# Patient Record
Sex: Male | Born: 1940 | Race: White | Hispanic: No | Marital: Married | Smoking: Former smoker
Health system: Southern US, Community
[De-identification: ages and names within clinical notes are randomized; demographics above are authoritative.]

## PROBLEM LIST (undated history)

## (undated) DIAGNOSIS — R0600 Dyspnea, unspecified: Secondary | ICD-10-CM

## (undated) DIAGNOSIS — R232 Flushing: Secondary | ICD-10-CM

## (undated) DIAGNOSIS — I2510 Atherosclerotic heart disease of native coronary artery without angina pectoris: Secondary | ICD-10-CM

## (undated) DIAGNOSIS — K222 Esophageal obstruction: Secondary | ICD-10-CM

## (undated) DIAGNOSIS — E785 Hyperlipidemia, unspecified: Secondary | ICD-10-CM

## (undated) DIAGNOSIS — S46219A Strain of muscle, fascia and tendon of other parts of biceps, unspecified arm, initial encounter: Secondary | ICD-10-CM

## (undated) DIAGNOSIS — I10 Essential (primary) hypertension: Secondary | ICD-10-CM

## (undated) DIAGNOSIS — I700 Atherosclerosis of aorta: Secondary | ICD-10-CM

## (undated) DIAGNOSIS — C61 Malignant neoplasm of prostate: Secondary | ICD-10-CM

## (undated) DIAGNOSIS — K219 Gastro-esophageal reflux disease without esophagitis: Secondary | ICD-10-CM

## (undated) DIAGNOSIS — IMO0002 Reserved for concepts with insufficient information to code with codable children: Secondary | ICD-10-CM

## (undated) DIAGNOSIS — R911 Solitary pulmonary nodule: Secondary | ICD-10-CM

## (undated) DIAGNOSIS — E119 Type 2 diabetes mellitus without complications: Secondary | ICD-10-CM

## (undated) DIAGNOSIS — I440 Atrioventricular block, first degree: Secondary | ICD-10-CM

## (undated) DIAGNOSIS — M4800 Spinal stenosis, site unspecified: Secondary | ICD-10-CM

## (undated) DIAGNOSIS — J690 Pneumonitis due to inhalation of food and vomit: Secondary | ICD-10-CM

## (undated) DIAGNOSIS — Z8709 Personal history of other diseases of the respiratory system: Secondary | ICD-10-CM

## (undated) DIAGNOSIS — M1990 Unspecified osteoarthritis, unspecified site: Secondary | ICD-10-CM

## (undated) DIAGNOSIS — C4490 Unspecified malignant neoplasm of skin, unspecified: Secondary | ICD-10-CM

## (undated) DIAGNOSIS — K5730 Diverticulosis of large intestine without perforation or abscess without bleeding: Secondary | ICD-10-CM

## (undated) HISTORY — PX: MASTECTOMY: SHX3

## (undated) HISTORY — DX: Malignant neoplasm of prostate: C61

## (undated) HISTORY — DX: Gastro-esophageal reflux disease without esophagitis: K21.9

## (undated) HISTORY — DX: Unspecified osteoarthritis, unspecified site: M19.90

## (undated) HISTORY — PX: HYDROCELE EXCISION / REPAIR: SUR1145

## (undated) HISTORY — DX: Solitary pulmonary nodule: R91.1

## (undated) HISTORY — PX: INGUINAL HERNIA REPAIR: SUR1180

## (undated) HISTORY — PX: OTHER SURGICAL HISTORY: SHX169

## (undated) HISTORY — DX: Hyperlipidemia, unspecified: E78.5

## (undated) HISTORY — DX: Esophageal obstruction: K22.2

## (undated) HISTORY — DX: Essential (primary) hypertension: I10

## (undated) HISTORY — PX: FOOT SURGERY: SHX648

## (undated) HISTORY — DX: Atherosclerotic heart disease of native coronary artery without angina pectoris: I25.10

## (undated) HISTORY — DX: Diverticulosis of large intestine without perforation or abscess without bleeding: K57.30

## (undated) HISTORY — DX: Reserved for concepts with insufficient information to code with codable children: IMO0002

## (undated) HISTORY — DX: Pneumonitis due to inhalation of food and vomit: J69.0

---

## 1998-05-11 ENCOUNTER — Emergency Department (HOSPITAL_COMMUNITY): Admission: EM | Admit: 1998-05-11 | Discharge: 1998-05-11 | Payer: Self-pay | Admitting: Emergency Medicine

## 1998-05-11 ENCOUNTER — Encounter: Payer: Self-pay | Admitting: Emergency Medicine

## 2002-07-08 ENCOUNTER — Ambulatory Visit (HOSPITAL_BASED_OUTPATIENT_CLINIC_OR_DEPARTMENT_OTHER): Admission: RE | Admit: 2002-07-08 | Discharge: 2002-07-08 | Payer: Self-pay | Admitting: Urology

## 2002-07-08 ENCOUNTER — Encounter: Payer: Self-pay | Admitting: Urology

## 2005-02-07 ENCOUNTER — Ambulatory Visit: Payer: Self-pay | Admitting: Pulmonary Disease

## 2005-07-18 HISTORY — PX: CARDIAC CATHETERIZATION: SHX172

## 2006-02-21 ENCOUNTER — Ambulatory Visit: Payer: Self-pay | Admitting: Pulmonary Disease

## 2006-02-23 ENCOUNTER — Ambulatory Visit: Payer: Self-pay | Admitting: Cardiology

## 2006-02-24 ENCOUNTER — Ambulatory Visit: Payer: Self-pay

## 2006-02-27 ENCOUNTER — Ambulatory Visit: Payer: Self-pay | Admitting: Cardiology

## 2006-02-27 ENCOUNTER — Ambulatory Visit: Payer: Self-pay

## 2006-02-28 ENCOUNTER — Inpatient Hospital Stay (HOSPITAL_BASED_OUTPATIENT_CLINIC_OR_DEPARTMENT_OTHER): Admission: RE | Admit: 2006-02-28 | Discharge: 2006-02-28 | Payer: Self-pay | Admitting: Cardiology

## 2006-02-28 ENCOUNTER — Ambulatory Visit: Payer: Self-pay | Admitting: Cardiology

## 2006-03-02 ENCOUNTER — Ambulatory Visit: Payer: Self-pay | Admitting: Cardiology

## 2006-03-02 ENCOUNTER — Inpatient Hospital Stay (HOSPITAL_COMMUNITY): Admission: AD | Admit: 2006-03-02 | Discharge: 2006-03-03 | Payer: Self-pay | Admitting: Cardiology

## 2006-03-14 ENCOUNTER — Ambulatory Visit: Payer: Self-pay | Admitting: Cardiovascular Disease

## 2006-03-16 ENCOUNTER — Ambulatory Visit: Payer: Self-pay | Admitting: Cardiology

## 2006-03-22 ENCOUNTER — Ambulatory Visit: Payer: Self-pay | Admitting: Pulmonary Disease

## 2006-03-24 ENCOUNTER — Ambulatory Visit: Payer: Self-pay | Admitting: Cardiology

## 2006-04-27 ENCOUNTER — Ambulatory Visit: Payer: Self-pay | Admitting: Cardiology

## 2006-05-25 ENCOUNTER — Encounter: Admission: RE | Admit: 2006-05-25 | Discharge: 2006-05-25 | Payer: Self-pay | Admitting: Urology

## 2006-06-23 ENCOUNTER — Ambulatory Visit: Payer: Self-pay | Admitting: Pulmonary Disease

## 2006-06-26 ENCOUNTER — Ambulatory Visit: Payer: Self-pay | Admitting: Cardiology

## 2006-06-26 ENCOUNTER — Ambulatory Visit: Payer: Self-pay | Admitting: Pulmonary Disease

## 2006-06-26 LAB — CONVERTED CEMR LAB
Albumin: 3.7 g/dL (ref 3.5–5.2)
Basophils Absolute: 0.1 10*3/uL (ref 0.0–0.1)
Basophils Relative: 0.8 % (ref 0.0–1.0)
CO2: 29 meq/L (ref 19–32)
Chloride: 106 meq/L (ref 96–112)
Eosinophil percent: 5.2 % — ABNORMAL HIGH (ref 0.0–5.0)
GFR calc non Af Amer: 80 mL/min
Glomerular Filtration Rate, Af Am: 97 mL/min/{1.73_m2}
Glucose, Bld: 121 mg/dL — ABNORMAL HIGH (ref 70–99)
HCT: 44.5 % (ref 39.0–52.0)
LDL Cholesterol: 77 mg/dL (ref 0–99)
MCHC: 33.5 g/dL (ref 30.0–36.0)
MCV: 93.8 fL (ref 78.0–100.0)
Monocytes Absolute: 0.7 10*3/uL (ref 0.2–0.7)
Neutro Abs: 2.5 10*3/uL (ref 1.4–7.7)
Potassium: 4.5 meq/L (ref 3.5–5.1)
RBC: 4.74 M/uL (ref 4.22–5.81)
RDW: 11.8 % (ref 11.5–14.6)
Sodium: 141 meq/L (ref 135–145)
Total Protein: 6.5 g/dL (ref 6.0–8.3)

## 2006-06-27 ENCOUNTER — Ambulatory Visit: Admission: RE | Admit: 2006-06-27 | Discharge: 2006-09-22 | Payer: Self-pay | Admitting: Radiation Oncology

## 2006-09-22 ENCOUNTER — Ambulatory Visit: Admission: RE | Admit: 2006-09-22 | Discharge: 2006-11-01 | Payer: Self-pay | Admitting: Radiation Oncology

## 2006-10-23 ENCOUNTER — Ambulatory Visit: Payer: Self-pay | Admitting: Pulmonary Disease

## 2006-11-02 ENCOUNTER — Ambulatory Visit: Payer: Self-pay | Admitting: Cardiology

## 2007-02-27 ENCOUNTER — Ambulatory Visit: Payer: Self-pay | Admitting: Pulmonary Disease

## 2007-03-09 ENCOUNTER — Ambulatory Visit: Payer: Self-pay | Admitting: Pulmonary Disease

## 2007-03-09 LAB — CONVERTED CEMR LAB
ALT: 27 units/L (ref 0–53)
Alkaline Phosphatase: 60 units/L (ref 39–117)
Basophils Absolute: 0 10*3/uL (ref 0.0–0.1)
Bilirubin, Direct: 0.1 mg/dL (ref 0.0–0.3)
Chloride: 108 meq/L (ref 96–112)
Cholesterol: 168 mg/dL (ref 0–200)
GFR calc Af Amer: 109 mL/min
HDL: 34 mg/dL — ABNORMAL LOW (ref >39.0)
Hemoglobin: 13.4 g/dL (ref 13.0–17.0)
LDL Cholesterol: 105 mg/dL — ABNORMAL HIGH (ref 0–99)
Lymphocytes Relative: 32 % (ref 12.0–46.0)
MCHC: 34.9 g/dL (ref 30.0–36.0)
MCV: 93.9 fL (ref 78.0–100.0)
PSA: 0.03 ng/mL — ABNORMAL LOW (ref 0.10–4.00)
Potassium: 4.9 meq/L (ref 3.5–5.1)
RBC: 4.08 M/uL — ABNORMAL LOW (ref 4.22–5.81)
Sodium: 142 meq/L (ref 135–145)
Total Protein: 6.3 g/dL (ref 6.0–8.3)
Triglycerides: 143 mg/dL (ref 0–149)
VLDL: 29 mg/dL (ref 0–40)

## 2007-06-05 ENCOUNTER — Telehealth (INDEPENDENT_AMBULATORY_CARE_PROVIDER_SITE_OTHER): Payer: Self-pay

## 2007-06-18 ENCOUNTER — Ambulatory Visit (HOSPITAL_COMMUNITY): Admission: RE | Admit: 2007-06-18 | Discharge: 2007-06-18 | Payer: Self-pay | Admitting: Urology

## 2007-06-19 ENCOUNTER — Encounter: Payer: Self-pay | Admitting: Pulmonary Disease

## 2007-06-19 DIAGNOSIS — I2510 Atherosclerotic heart disease of native coronary artery without angina pectoris: Secondary | ICD-10-CM | POA: Insufficient documentation

## 2007-06-19 DIAGNOSIS — C61 Malignant neoplasm of prostate: Secondary | ICD-10-CM | POA: Insufficient documentation

## 2007-06-19 DIAGNOSIS — E785 Hyperlipidemia, unspecified: Secondary | ICD-10-CM

## 2007-06-19 DIAGNOSIS — E7800 Pure hypercholesterolemia, unspecified: Secondary | ICD-10-CM | POA: Insufficient documentation

## 2007-06-27 ENCOUNTER — Ambulatory Visit: Payer: Self-pay | Admitting: Cardiology

## 2007-07-10 ENCOUNTER — Encounter: Payer: Self-pay | Admitting: Pulmonary Disease

## 2007-08-07 ENCOUNTER — Telehealth (INDEPENDENT_AMBULATORY_CARE_PROVIDER_SITE_OTHER): Payer: Self-pay | Admitting: *Deleted

## 2007-08-27 ENCOUNTER — Ambulatory Visit: Payer: Self-pay | Admitting: Pulmonary Disease

## 2007-08-27 DIAGNOSIS — C7800 Secondary malignant neoplasm of unspecified lung: Secondary | ICD-10-CM | POA: Insufficient documentation

## 2007-08-27 DIAGNOSIS — K5730 Diverticulosis of large intestine without perforation or abscess without bleeding: Secondary | ICD-10-CM | POA: Insufficient documentation

## 2007-08-27 DIAGNOSIS — K219 Gastro-esophageal reflux disease without esophagitis: Secondary | ICD-10-CM | POA: Insufficient documentation

## 2007-08-27 DIAGNOSIS — I10 Essential (primary) hypertension: Secondary | ICD-10-CM | POA: Insufficient documentation

## 2007-08-27 DIAGNOSIS — M1990 Unspecified osteoarthritis, unspecified site: Secondary | ICD-10-CM | POA: Insufficient documentation

## 2007-08-27 DIAGNOSIS — J42 Unspecified chronic bronchitis: Secondary | ICD-10-CM | POA: Insufficient documentation

## 2007-09-03 ENCOUNTER — Ambulatory Visit: Payer: Self-pay | Admitting: Pulmonary Disease

## 2007-09-08 LAB — CONVERTED CEMR LAB
ALT: 27 units/L (ref 0–53)
AST: 20 units/L (ref 0–37)
Albumin: 3.7 g/dL (ref 3.5–5.2)
Alkaline Phosphatase: 60 units/L (ref 39–117)
CO2: 28 meq/L (ref 19–32)
Calcium: 9.6 mg/dL (ref 8.4–10.5)
Cholesterol: 163 mg/dL (ref 0–200)
Creatinine, Ser: 1.1 mg/dL (ref 0.4–1.5)
Direct LDL: 90.8 mg/dL
GFR calc non Af Amer: 71 mL/min
Glucose, Bld: 129 mg/dL — ABNORMAL HIGH (ref 70–99)
HDL: 34.1 mg/dL — ABNORMAL LOW (ref >39.0)
Total Protein: 6.7 g/dL (ref 6.0–8.3)
VLDL: 41 mg/dL — ABNORMAL HIGH (ref 0–40)

## 2007-10-31 ENCOUNTER — Ambulatory Visit: Payer: Self-pay | Admitting: Cardiology

## 2008-05-27 ENCOUNTER — Ambulatory Visit: Payer: Self-pay | Admitting: Pulmonary Disease

## 2008-10-30 ENCOUNTER — Encounter: Payer: Self-pay | Admitting: Pulmonary Disease

## 2008-11-24 ENCOUNTER — Ambulatory Visit: Payer: Self-pay | Admitting: Cardiology

## 2008-11-24 DIAGNOSIS — E663 Overweight: Secondary | ICD-10-CM | POA: Insufficient documentation

## 2008-11-26 ENCOUNTER — Telehealth: Payer: Self-pay | Admitting: Cardiology

## 2008-12-07 ENCOUNTER — Encounter: Payer: Self-pay | Admitting: Cardiology

## 2008-12-08 ENCOUNTER — Ambulatory Visit: Payer: Self-pay | Admitting: Cardiology

## 2008-12-16 LAB — CONVERTED CEMR LAB
ALT: 28 units/L (ref 0–53)
Alkaline Phosphatase: 46 units/L (ref 39–117)
BUN: 16 mg/dL (ref 6–23)
Bilirubin, Direct: 0.1 mg/dL (ref 0.0–0.3)
CO2: 27 meq/L (ref 19–32)
Chloride: 109 meq/L (ref 96–112)
Creatinine, Ser: 1 mg/dL (ref 0.4–1.5)
GFR calc non Af Amer: 79.15 mL/min (ref >60)
Glucose, Bld: 107 mg/dL — ABNORMAL HIGH (ref 70–99)
LDL Cholesterol: 89 mg/dL (ref 0–99)
Potassium: 4 meq/L (ref 3.5–5.1)
Triglycerides: 131 mg/dL (ref 0.0–149.0)
VLDL: 26.2 mg/dL (ref 0.0–40.0)

## 2008-12-25 ENCOUNTER — Telehealth (INDEPENDENT_AMBULATORY_CARE_PROVIDER_SITE_OTHER): Payer: Self-pay | Admitting: *Deleted

## 2009-05-25 ENCOUNTER — Telehealth: Payer: Self-pay | Admitting: Pulmonary Disease

## 2009-06-03 ENCOUNTER — Ambulatory Visit: Payer: Self-pay | Admitting: Pulmonary Disease

## 2009-06-05 LAB — CONVERTED CEMR LAB
Cholesterol: 161 mg/dL (ref 0–200)
HDL: 35.7 mg/dL — ABNORMAL LOW (ref >39.00)
Total CHOL/HDL Ratio: 5
Triglycerides: 148 mg/dL (ref 0.0–149.0)

## 2009-07-18 HISTORY — PX: ESOPHAGOGASTRODUODENOSCOPY: SHX1529

## 2009-08-19 ENCOUNTER — Ambulatory Visit: Payer: Self-pay | Admitting: Pulmonary Disease

## 2009-11-17 ENCOUNTER — Telehealth: Payer: Self-pay | Admitting: Pulmonary Disease

## 2009-11-17 ENCOUNTER — Ambulatory Visit: Payer: Self-pay | Admitting: Internal Medicine

## 2009-11-17 ENCOUNTER — Encounter (INDEPENDENT_AMBULATORY_CARE_PROVIDER_SITE_OTHER): Payer: Self-pay | Admitting: *Deleted

## 2009-11-17 ENCOUNTER — Encounter: Payer: Self-pay | Admitting: Gastroenterology

## 2009-11-17 ENCOUNTER — Telehealth: Payer: Self-pay | Admitting: Gastroenterology

## 2009-11-17 DIAGNOSIS — R1319 Other dysphagia: Secondary | ICD-10-CM | POA: Insufficient documentation

## 2009-11-17 DIAGNOSIS — R1310 Dysphagia, unspecified: Secondary | ICD-10-CM | POA: Insufficient documentation

## 2009-11-18 ENCOUNTER — Telehealth: Payer: Self-pay | Admitting: Physician Assistant

## 2009-11-23 ENCOUNTER — Ambulatory Visit: Payer: Self-pay | Admitting: Gastroenterology

## 2009-12-02 ENCOUNTER — Telehealth: Payer: Self-pay | Admitting: Gastroenterology

## 2010-03-18 ENCOUNTER — Telehealth (INDEPENDENT_AMBULATORY_CARE_PROVIDER_SITE_OTHER): Payer: Self-pay | Admitting: *Deleted

## 2010-03-19 ENCOUNTER — Ambulatory Visit: Payer: Self-pay | Admitting: Pulmonary Disease

## 2010-03-21 LAB — CONVERTED CEMR LAB
ALT: 30 units/L (ref 0–53)
AST: 29 units/L (ref 0–37)
Alkaline Phosphatase: 57 units/L (ref 39–117)
BUN: 19 mg/dL (ref 6–23)
Bilirubin, Direct: 0.1 mg/dL (ref 0.0–0.3)
Calcium: 9 mg/dL (ref 8.4–10.5)
Chloride: 104 meq/L (ref 96–112)
Eosinophils Absolute: 0.3 10*3/uL (ref 0.0–0.7)
GFR calc non Af Amer: 76.21 mL/min (ref >60)
Glucose, Bld: 110 mg/dL — ABNORMAL HIGH (ref 70–99)
HDL: 42 mg/dL (ref >39.00)
LDL Cholesterol: 93 mg/dL (ref 0–99)
Monocytes Absolute: 0.6 10*3/uL (ref 0.1–1.0)
Monocytes Relative: 9.9 % (ref 3.0–12.0)
Neutro Abs: 2.5 10*3/uL (ref 1.4–7.7)
PSA: 0.57 ng/mL (ref 0.10–4.00)
Platelets: 170 10*3/uL (ref 150.0–400.0)
Potassium: 4.5 meq/L (ref 3.5–5.1)
Sodium: 138 meq/L (ref 135–145)
Total CHOL/HDL Ratio: 4
Total Protein: 6.6 g/dL (ref 6.0–8.3)
VLDL: 28 mg/dL (ref 0.0–40.0)
WBC: 6.2 10*3/uL (ref 4.5–10.5)

## 2010-03-25 ENCOUNTER — Ambulatory Visit: Payer: Self-pay | Admitting: Cardiology

## 2010-04-22 ENCOUNTER — Ambulatory Visit: Payer: Self-pay

## 2010-04-22 ENCOUNTER — Ambulatory Visit: Payer: Self-pay | Admitting: Cardiology

## 2010-07-18 HISTORY — PX: SKIN CANCER EXCISION: SHX779

## 2010-08-17 NOTE — Assessment & Plan Note (Signed)
Summary: f1y/dx:414.00/lg   Visit Type:  Follow-up Primary Provider:  Alroy Dust, MD  CC:  CAD.  History of Present Illness: The patient presents for followup of his known coronary disease.  Since I last saw him he has had no new cardiovascular problems. He says he's been working a lot and having stress from this. He thinks this has left him with some fatigue and he was recently started on some Xanax. He is not exercising routinely though he does walk at work. With this he denies chest pressure, neck or arm discomfort. He has had no palpitations, presyncope or syncope. He has had no shortness of breath and denies any PND or orthopnea. He denies any weight gain or edema.  He reports his blood pressures have been in the 110 to 120s systolic with diastolics in the 70s. I reviewed lipids done the other day and his LDL is 93 with an HDL of 42.  Current Medications (verified): 1)  Adprin B 325 Mg  Tabs (Aspirin Buf(Cacarb-Mgcarb-Mgo)) .... Once Daily 2)  Atenolol 25 Mg  Tabs (Atenolol) .Marland Kitchen.. 1 Tab By Mouth Once Daily 3)  Lisinopril 10 Mg Tabs (Lisinopril) .... I By Mouth Daily 4)  Pravastatin Sodium 20 Mg  Tabs (Pravastatin Sodium) .Marland Kitchen.. 1 Tab By Mouth At Bedtime 5)  Aciphex 20 Mg Tbec (Rabeprazole Sodium) .... Take 1 Tab Daily 30 Min Before Breakfast 6)  Alprazolam 0.5 Mg Tabs (Alprazolam) .... Take 1/2 To 1 Tab By Mouth Three Times A Day As Needed For Nerves...  Allergies (verified): 1)  ! Simvastatin (Simvastatin)  Past History:  Past Medical History: Reviewed history from 03/19/2010 and no changes required. BRONCHITIS, RECURRENT (ICD-491.9) Hx of PULMONARY NODULE (ICD-518.89) CAD (anterior ischemia on a stress perfusion study. (Catheterization in 2007, demonstrating 95% mid- LAD stenosis.  The circumflex had 20% proximal stenosis, the right coronary artery had 20% mid stenosis.  The EF was 60%.  He had a non drug-eluting stent placed) (ICD-414.00) HYPERTENSION  (ICD-401.9) DYSLIPIDEMIA (ICD-272.4) GERD (ICD-530.81 ESOPHAGEAL STRICTURE  DIVERTICULOSIS OF COLON (ICD-562.10) PROSTATE CANCER (ICD-185) DEGENERATIVE JOINT DISEASE (ICD-715.90)  Past Surgical History: Eyelid Surgery for Lid Lag Foot Surgery Hydrocele Repair Lung Nodules, probably benign Mastectomy for benign tumor Inguinal hernia repair Post Stenting to the Mid LAD Prostate treatment  Review of Systems       As stated in the HPI and negative for all other systems.   Vital Signs:  Patient profile:   70 year old male Height:      71 inches Weight:      199 pounds BMI:     27.86 Pulse rate:   58 / minute BP sitting:   111 / 71  (left arm) Cuff size:   regular  Vitals Entered By: Burnett Kanaris, CNA (March 25, 2010 9:46 AM)  Physical Exam  General:  Well developed, well nourished, in no acute distress. Head:  normocephalic and atraumatic Eyes:  PERRLA/EOM intact; conjunctiva and lids normal. Mouth:  Edentulous. Oral mucosa normal. Neck:  Neck supple, no JVD. No masses, thyromegaly or abnormal cervical nodes. Chest Wall:  no deformities or breast masses noted Lungs:  Clear bilaterally to auscultation and percussion. Heart:  Non-displaced PMI, chest non-tender; regular rate and rhythm, S1, S2 without murmurs, rubs or gallops. Carotid upstroke normal, no bruit. Normal abdominal aortic size, no bruits. Femorals normal pulses, no bruits. Pedals normal pulses. No edema, no varicosities. Abdomen:  Bowel sounds positive; abdomen soft and non-tender without masses, organomegaly, or hernias noted. No  hepatosplenomegaly. Msk:  Back normal, normal gait. Muscle strength and tone normal. Extremities:  No clubbing or cyanosis. Neurologic:  Alert and oriented x 3. Skin:  Intact without lesions or rashes. Cervical Nodes:  no significant adenopathy Axillary Nodes:  no significant adenopathy Inguinal Nodes:  no significant adenopathy Psych:  Normal affect.   Impression &  Recommendations:  Problem # 1:  CAD (ICD-414.00) It has been over 4 years since his catheterization and stent. Screening with an exercise treadmill test as indicated. This will allow me to rule out obstructive coronary disease as well as risk stratify and most importantly give him a prescription for exercise. Orders: Treadmill (Treadmill) EKG w/ Interpretation (93000)  Problem # 2:  HYPERTENSION (ICD-401.9) His blood pressure is in an excellent range and I would encourage him to continue the meds as listed.  Problem # 3:  DYSLIPIDEMIA (ICD-272.4) His lipids are at target though not completely ideal. He will continue with the same meds and review his diet.  Patient Instructions: 1)  Your physician recommends that you schedule a follow-up appointment at the time of your treadmill and then 1 yr 2)  Your physician recommends that you continue on your current medications as directed. Please refer to the Current Medication list given to you today. 3)  Your physician has requested that you have an exercise tolerance test.  For further information please visit [email or web address]  Please also follow instruction sheet, as given.

## 2010-08-17 NOTE — Progress Notes (Signed)
Summary: appt w/GI  Phone Note Call from Patient Call back at Home Phone [phone]   Caller: spouse-Bonnie Call For: Kriste Basque Reason for Call: Talk to Nurse Summary of Call: Called to try to get pt in for appt w/ Dr. Jarold Motto.  First avaiable was 12/15/2009.  Pt having trouble swallowing.  Wife doesn't feel he can wait that long.  Wants to know if SN will call and try to get him in earlier. Initial call taken by: Eugene Gavia,  Nov 17, 2009 9:19 AM  Follow-up for Phone Call        called and spoke with pt's spouse, Kendal Hymen.  Kendal Hymen states pt has seen Dr. Jarold Motto in the past, it has just been several years, and therefore doesn't want to see another physician in GI.  Pt c/o increased difficulty swollowing x several weeks.  Kendal Hymen states pt cannot drink liquids without "throat closing up" causing patient to choke and cough.  BOnnie would like pt to be seen by GI asap.  Will forward message to SN to address.  Aundra Millet Reynolds LPN  Nov 18, 979 9:47 AM   Called GI and spoke with Lupita Leash who schedules pt to see Amy, PA today 11/17/09 at 3:30. Pt's wife and pt is aware of appt. Rhonda Cobb  Nov 17, 2009 10:24 AM

## 2010-08-17 NOTE — Progress Notes (Signed)
Summary: Aciphex Rx  Phone Note Call from Patient   Caller: Patient Summary of Call: Pt calling.  States he was given samples of Aciphex and was told to call if needed rx.  Pt states this is working well for him.  Req Rx  Initial call taken by: Ashok Cordia RN,  Dec 02, 2009 2:50 PM  Follow-up for Phone Call        Rx sent as requested. Follow-up by: Ashok Cordia RN,  Dec 02, 2009 2:50 PM    Prescriptions: ACIPHEX 20 MG TBEC (RABEPRAZOLE SODIUM) Take 1 tab daily 30 min before breakfast  #30 x 6   Entered by:   Ashok Cordia RN   Authorized by:   Mardella Layman MD [facility]   Signed by:   Ashok Cordia RN on 12/02/2009   Method used:   Electronically to        [facility]  [address] (retail)       [address]       [place], [place]  [address]       Ph: [phone]       Fax: [phone]   RxID:   [id number]

## 2010-08-17 NOTE — Procedures (Signed)
Summary: Upper Endoscopy  Patient: [name] Note: All result statuses are Final unless otherwise noted.  Tests: (1) Upper Endoscopy (EGD)   EGD Upper Endoscopy       DONE     [facility]     [address]     [place], [place]  [address]           ENDOSCOPY PROCEDURE REPORT           PATIENT:  [name], [name]  MR#:  [id number]     BIRTHDATE:  Nov 19, 1940, 69 yrs. old  GENDER:  male           ENDOSCOPIST:  Vania Rea. Jarold Motto, MD, [facility]     Referred by:  Mike Gip, PA-C           PROCEDURE DATE:  11/23/2009     PROCEDURE:  EGD, diagnostic, Elease Hashimoto Dilation of Esophagus     ASA CLASS:  Class II     INDICATIONS:  dysphagia, GERD           MEDICATIONS:   Fentanyl 75 mcg IV, Versed 8 mg IV     TOPICAL ANESTHETIC:  Exactacain Spray           DESCRIPTION OF PROCEDURE:   After the risks benefits and     alternatives of the procedure were thoroughly explained, informed     consent was obtained.  The [id number]-H180 [id number] endoscope was     introduced through the mouth and advanced to the second portion of     the duodenum, without limitations.  The instrument was slowly     withdrawn as the mucosa was fully examined.     <<PROCEDUREIMAGES>>           A stricture was found. SMOOTH STRICTURING OF DISTAL     GEJ.DILATED#57F MALONEY DILATOR.NO HEME OR PAIN,,  A hiatal hernia     was found. 3 CM HH NOTED.NO BARRETT'S MUCOSA.  The stomach was     entered and closely examined. The antrum, angularis, and lesser     curvature were well visualized, including a retroflexed view of     the cardia and fundus. The stomach wall was normally distensable.     The scope passed easily through the pylorus into the duodenum.     The duodenal bulb was normal in appearance, as was the postbulbar     duodenum. LARGE DUODENAL DIVERTICULUM FULL OF DEBRIS.SEE PICTURE.     Retroflexed views revealed a hiatal hernia.    The scope was then     withdrawn from the patient and the procedure completed.       COMPLICATIONS:  None           ENDOSCOPIC IMPRESSION:     1) Stricture     2) Hiatal hernia     3) Normal stomach     4) Normal duodenum     5) A hiatal hernia     CHNIC GERD AND PEPTIC STRICTURE DILATED.     RECOMMENDATIONS:     1) Anti-reflux regimen to be follow     2) Clear liquids until, then soft foods rest iof day. Resume     prior diet tomorrow.     ACIPHEX 20 MG QAM.SAMPLES GIVEN.           REPEAT EXAM:  No           ______________________________     Vania Rea. Jarold Motto, MD, Clementeen Graham  CC:  Michele Mcalpine, MD           n.     Rosalie DoctorVania Rea. Afnan Emberton at 11/23/2009 04:25 PM           Jerral Ralph, [id number]  Note: An exclamation mark (!) indicates a result that was not dispersed into the flowsheet. Document Creation Date: 11/23/2009 4:26 PM _______________________________________________________________________  (1) Order result status: Final Collection or observation date-time: 11/23/2009 16:17 Requested date-time:  Receipt date-time:  Reported date-time:  Referring Physician:   Ordering Physician: Sheryn Bison [id number]) Specimen Source:  Source: Launa Grill Order Number: [id number] Lab site:

## 2010-08-17 NOTE — Letter (Signed)
Summary: New Patient letter  [facility]  [address] [place], [place] [address]   Phone: [phone]  Fax: [phone]       11/17/2009 MRN: [id number]  [facility] [address] [place], [place]  [address]  Dear Mr. Cunanan,  Welcome to the Gastroenterology Division at [facility].    You are scheduled to see Dr. Jarold Motto on 12-15-09 at 10:00a.m. on the 3rd floor at [facility], [address]  We ask that you try to arrive at our office 15 minutes prior to your appointment time to allow for check-in.  We would like you to complete the enclosed self-administered evaluation form prior to your visit and bring it with you on the day of your appointment.  We will review it with you.  Also, please bring a complete list of all your medications or, if you prefer, bring the medication bottles and we will list them.  Please bring your insurance card so that we may make a copy of it.  If your insurance requires a referral to see a specialist, please bring your referral form from your primary care physician.  Co-payments are due at the time of your visit and may be paid by cash, check or credit card.     Your office visit will consist of a consult with your physician (includes a physical exam), any laboratory testing he/she may order, scheduling of any necessary diagnostic testing (e.g. x-ray, ultrasound, CT-scan), and scheduling of a procedure (e.g. Endoscopy, Colonoscopy) if required.  Please allow enough time on your schedule to allow for any/all of these possibilities.    If you cannot keep your appointment, please call [phone] to cancel or reschedule prior to your appointment date.  This allows [place] the opportunity to schedule an appointment for another patient in need of care.  If you do not cancel or reschedule by 5 p.m. the business day prior to your appointment date, you will be charged a $50.00 late cancellation/no-show fee.    Thank you for  choosing [facility] for your medical needs.  We appreciate the opportunity to care for you.  Please visit [place] at our website  to learn more about our practice.                     Sincerely,                                                             The Gastroenterology Division

## 2010-08-17 NOTE — Progress Notes (Signed)
Summary: labs  Phone Note Call from Patient Call back at [phone]   Caller: Patient Call For: nadel Summary of Call: pt would like to have labs done to determine why he is tired and dizzy. Initial call taken by: Rickard Patience,  March 18, 2010 8:16 AM  Follow-up for Phone Call        Spoke with pt wife and pt has been c/o extreme fatigue and dizziness x 2 weeks. There have been no changing meds per patient. Patient wants to know if there are any blood test that can be ordered to check to see why he is so dizzy and fatigued. Carron Curie CMA  March 18, 2010 10:10 AM   Additional Follow-up for Phone Call Additional follow up Details #1::        per SN---for the fatigue we can check labs but for the dizziness there are no labs he will need ov.  do they want to do the labs and ov at the same time?  if so SN has an opening for tomorrow at 10.  thanks Randell Loop CMA  March 18, 2010 10:41 AM    Spoke with pt's spouse; would like to have pts labs done before appt on Friday at 10am. Please advise that labs and I will call patient spouse back.Reynaldo Minium CMA  March 18, 2010 11:46 AM     Additional Follow-up for Phone Call Additional follow up Details #2::    can do---cbc-285.9, bmp-401.9 and tsh-244.9/  thanks Randell Loop CMA  March 18, 2010 11:51 AM    Pt spouse aware that Labs are in IDX and hav ethem done prior to 10am appt.Reynaldo Minium CMA  March 18, 2010 12:14 PM

## 2010-08-17 NOTE — Assessment & Plan Note (Signed)
Summary: Dysphagia/dfs   History of Present Illness Visit Type: Initial Consult Primary GI MD: Sheryn Bison MD FACP FAGA Primary Provider: Alroy Dust, MD Requesting Provider: Alroy Dust, MD Chief Complaint: Patient complains of dysphagia that started about a week ago. He is taking Prilosec but he feels like his esophagus is closing up on him as he eats. He has a history of dilations and relates the way he feels now to the way he felt before the previous dilations.  History of Present Illness:   70 YO  MALE  KNOWN TO DR. PATTERSON WITH PRIOR EGD AND COLONOSCOPY IN 2004. HE DID HAVE A STRICTURE IN THE DISTAL ESOPHAGUS WHICH WAS MALONEY DILATED TO 18 MM. HE HAS BEEN ON CHRONIC OTC PRILOSEC. HE SAYS ACIPHEX WORKS AOT BETTER FOR HIM ,BUT INSURANCE WOULD NOT COVER IN THE PAST.HE HAS HAD PROBLEMS WITH DIARRHEA WITH  OTHER PPI'S.  HE COMES IN WITH C/O DYSOHAGIA OVER THE PAST COUPLE WEEKS.APPETITE IS OK,WEIGHT IS STABLE. HE IS HAVING DISCOMFORT WITH SWALLOWING-FEELING FOOD HANG BUT EVENTUALLY GOES ON DOWN. NO REGURGITATION.HE HAS ALSO NOTICED SOME DISCOMFORT WITH LIQUIDS THIS PAST WEEK.   GI Review of Systems    Reports acid reflux and  dysphagia with solids.      Denies abdominal pain, belching, bloating, chest pain, dysphagia with liquids, heartburn, loss of appetite, nausea, vomiting, vomiting blood, and  weight loss.        Denies anal fissure, black tarry stools, change in bowel habit, constipation, diarrhea, diverticulosis, fecal incontinence, heme positive stool, hemorrhoids, irritable bowel syndrome, jaundice, light color stool, liver problems, rectal bleeding, and  rectal pain. Preventive Screening-Counseling & Management      Drug Use:  no.      Current Medications (verified): 1)  Adprin B 325 Mg  Tabs (Aspirin Buf(Cacarb-Mgcarb-Mgo)) .... Once Daily 2)  Atenolol 25 Mg  Tabs (Atenolol) .Marland Kitchen.. 1 Tab By Mouth Once Daily 3)  Lisinopril 10 Mg Tabs (Lisinopril) .... I By Mouth Daily 4)   Pravastatin Sodium 20 Mg  Tabs (Pravastatin Sodium) .Marland Kitchen.. 1 Tab By Mouth At Bedtime 5)  Prilosec 20 Mg Cpdr (Omeprazole) .... Take 1 Tablet Daily  Allergies (verified): 1)  ! Simvastatin (Simvastatin)  Past History:  Past Medical History:  BRONCHITIS, RECURRENT (ICD-491.9) Hx of PULMONARY NODULE (ICD-518.89) CAD (anterior ischemia on a stress perfusion study. (Catheterization in 2007, demonstrating 95% mid- LAD stenosis.  The circumflex had 20% proximal stenosis, the right coronary artery had 20% mid stenosis.  The EF was 60%.  He had a non drug-eluting stent placed) (ICD-414.00) HYPERTENSION (ICD-401.9) DYSLIPIDEMIA (ICD-272.4) GERD (ICD-530.81 ESOPHAGEAL STRICTURE  DIVERTICULOSIS OF COLON (ICD-562.10) PROSTATE CANCER (ICD-185) DEGENERATIVE JOINT DISEASE (ICD-715.90)  Past Surgical History:  Eyelid Surgery for Lid Lag Foot Surgery Hydrocele Repair Lung Nodules, probably benign Mastectomy for benign tumor Inguinal hernia repair Post Stenting to the Mid LAD prostate treatment  Family History: Noncontributory for early coronary disease. Family History of Prostate Cancer: all brothers, father No FH of Colon Cancer:  Social History:  The patient smoked just very briefly most recently in the  last six months.  He quit a week ago.  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 Alcohol Use - yes 3 beers per day Daily Caffeine Use 2 per day Illicit Drug Use - no Drug Use:  no  Review of Systems       The patient complains of allergy/sinus and sleeping problems.  The patient denies anemia, anxiety-new,  arthritis/joint pain, back pain, blood in urine, breast changes/lumps, change in vision, confusion, cough, coughing up blood, depression-new, fainting, fatigue, fever, headaches-new, hearing problems, heart murmur, heart rhythm changes, itching, menstrual pain, muscle pains/cramps, night sweats, nosebleeds, pregnancy symptoms, shortness of breath, skin  rash, sore throat, swelling of feet/legs, swollen lymph glands, thirst - excessive , urination - excessive , urination changes/pain, urine leakage, vision changes, and voice change.         ROS OTHERWISE AS IN HPI  Vital Signs:  Patient profile:   69 year old male Height:      71 inches Weight:      203.8 pounds BMI:     28.53 Pulse rate:   60 / minute Pulse rhythm:   regular BP sitting:   138 / 82  (right arm) Cuff size:   regular  Vitals Entered By: Harlow Mares CMA Duncan Dull) (Nov 17, 2009 3:56 PM)  Physical Exam  General:  Well developed, well nourished, no acute distress. Head:  Normocephalic and atraumatic. Eyes:  PERRLA, no icterus. Lungs:  Clear throughout to auscultation. Heart:  Regular rate and rhythm; no murmurs, rubs,  or bruits. Abdomen:  SOFT, NONTENDER, NO MASS OR HSM,BS+ Rectal:  NOT DONE Extremities:  No clubbing, cyanosis, edema or deformities noted. Neurologic:  Alert and  oriented x4;  grossly normal neurologically. Psych:  Alert and cooperative. Normal mood and affect.   Impression & Recommendations:  Problem # 1:  DYSPHAGIA (KGU-542.70) Assessment Deteriorated 69 YO MALE WITH SOLID FOOD DYSPHAGIA AND MILD ODYNOPHAGIA. HX OF ESOPHAGEAL STRICTURE WITH DILATION 2004. SXS MOST CONSISTENT WITH RECURRENT STRICTURE,MAY HAVE COMPONENT OF ESOPHAGITIS AS WELL.  SCHEDULE FOR EGD WITH DILATION WITH DR. PATTERSON-PROCEDURE DISCUSSED IN DETAIL WITH PT.  TRIAL OF ACIPHEX 20 MG TWICE DAILY X ONE WEEK ,THEN DAILY IN AM (INSATED OF PRILOSEC ).  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 2)  Stop the Prilosec. 3)  We have  scheduled the Endoscopy  for 11-23-09.  4)  Directions and brochure given. 5)  Hold the Aspirin 3 days before the procedure.  6)  Copy sent to : Alroy Dust, MD 7)  The medication list was reviewed and reconciled.  All changed / newly prescribed medications were explained.  A complete medication list was provided to the patient / caregiver. Prescriptions: ACIPHEX 20 MG TBEC (RABEPRAZOLE SODIUM) Take 1 tab daily 30 min before breakfast  #30 x 3   Entered by:   Lowry Ram NCMA   Authorized by:   Sammuel Cooper PA-c   Signed by:   Lowry Ram NCMA on 11/17/2009   Method used:   Electronically to        [facility]  [address] (retail)       [address]       [place], [place]  [address]       Ph: [phone]       Fax: [phone]   RxID:   [id number]

## 2010-08-17 NOTE — Assessment & Plan Note (Signed)
Summary: fatigue and dizzy/ok per LA/kcw   Primary Care Provider:  Alroy Dust, MD  CC:  7 month ROV & add-on for fatigue & dizzy....  History of Present Illness: 70 y/o WM here for a follow up visit... he has multiple medical problems as noted below...     ~  August 19, 2009:  routine f/u doing well- business was slow & he did a job in [place] but it didn't work out, now picking back up... he is active- denies CP, palpit, SOB, swelling, etc... he fell on the ice w/ some incr in right shoulder discomfort- he will call DrDaldorf as needed... he had labs 5/10 from DrHochrein & doesn't want full labs today, due for Flu shot...   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 he denies CP, palpit, change in SOB (he has chr stable DOE), cough, phlegm, edema, etc... BP controlled on meds;  no angina;  he saw GI 5/11 w/ dysphagia- EGD 5/11 by DrPatterson showed recurrent stricture, dilated #34F maloney, 3cmHH, & changed to ACIPHEX 20mg /d... we discussed checking FASTING labs today, and adding Alprazolam Prn fr all the stress he is under.    Current Problem List:  BRONCHITIS, RECURRENT (ICD-491.9) - no recent URI symptoms... Hx of PULMONARY NODULE (ICD-518.89) - mult sm benign pulm nodules (?granulomas) on CTChest- last 12/07 without adenopathy, no change, +coronary calcif seen...   ~  f/u CXR 8/08 without nodules seen...  ~  f/u CXR 11/09 clear, no lesions seen...  ~  f/u CXR 2/11 showed clear, NAD...  HYPERTENSION (ICD-401.9) - contolled on ATENOLOL 25mg /d, & LISINOPRIL 10mg /d... BP=110/62 here and usually 120s/ 70s at home- denies HA, fatigue, visual changes, CP, palipit, dizziness, syncope, dyspnea, edema, etc...  CAD (ICD-414.00) - followed by DrHochrein... on ASA 325mg /d.  ~  NuclearStressTest 8/07 abnormal w/ ant ischemia...  ~  cath 8/07 showed 95% mid-LAD  stenosis, & 20% lesions in the other 2 vessels, EF=60%... subseq PTCA w/ non-drug eluting stent...  ~  last OV w/ Cards= 5/10 & note reviewed- BP sl elevated & Lisinopril added.  DYSLIPIDEMIA (ICD-272.4) - on PRAVACHOL 20mg /d and tol OK...   ~  FLP 8/08 showed TChol 168, TG 143, HDL34, LDL105  ~  FLP 2/09 showed TChol 163, TG 207, HDL 34, LDL 91  ~  FLP 5/10 showed TChol 148, TG 131, HDL 33, LDL 89  ~  FLP 9/11 showed TChol 163, TG 140, HDL 42, LDL 93  GERD (ICD-530.81) - prev on OMEP20 but causes diarrhea he says, and ACIPHEX 20mg /d works better...   ~  last EGD (DrPatterson)was 8/04 showing GERD & stricture dilated...  ~  5/11: presented to GI w/ recurrent dysphagia- EGD showed 3cmHH, stricture dilated, changed to Aciphex.  DIVERTICULOSIS OF COLON (ICD-562.10) - last colonoscopy 8/04 by DrPatterson was normal, f/u 15yrs (scattered tics seen on 1996 flex)  PROSTATE CANCER (ICD-185) - followed by DrPeterson- elevated PSA found 8/07 (10.6)... biopsies were pos & there is a family hx as well... after consultation at [facility] they rec XRT, then hormonal Rx for 2 yrs... XRT completed by [facility] 4/08... prev on TRELSTAR injections q 53months & PROVERA for the hot flashes>> off both now... neg bone scan 1/08... last PSA here= 0.03 in 2008...  ~  2/11: we don't have recent note from [facility] &  pt will request info sent to [place] at next visit.  ~  9/11: labs here showed PSA= 0.57 & we will forward to DrPeterson  DEGENERATIVE JOINT DISEASE (ICD-715.90) - his left knee gives him some pain on and off... he saw DrDaldorf for this 12/08 & he thought there might be a torn meniscus, they decided to Rx w/ NAPROSEN which helps... may yet need MRI/ arthroscopy...  ~  2/11: discussed trial of Mobic to see if this works for him.   Preventive Screening-Counseling & Management  Alcohol-Tobacco     Smoking Status: quit     Packs/Day: 2.0     Year Quit: 2005  Allergies: 1)  ! Simvastatin  (Simvastatin)  Comments:  Nurse/Medical Assistant: The patient's medications and allergies were reviewed with the patient and were updated in the Medication and Allergy Lists.  Past History:  Past Medical History: BRONCHITIS, RECURRENT (ICD-491.9) Hx of PULMONARY NODULE (ICD-518.89) CAD (anterior ischemia on a stress perfusion study. (Catheterization in 2007, demonstrating 95% mid- LAD stenosis.  The circumflex had 20% proximal stenosis, the right coronary artery had 20% mid stenosis.  The EF was 60%.  He had a non drug-eluting stent placed) (ICD-414.00) HYPERTENSION (ICD-401.9) DYSLIPIDEMIA (ICD-272.4) GERD (ICD-530.81 ESOPHAGEAL STRICTURE  DIVERTICULOSIS OF COLON (ICD-562.10) PROSTATE CANCER (ICD-185) DEGENERATIVE JOINT DISEASE (ICD-715.90)  Past Surgical History: Eyelid Surgery for Lid Lag Foot Surgery Hydrocele Repair Lung Nodules, probably benign Mastectomy for benign tumor Inguinal hernia repair Post Stenting to the Mid LAD prostate treatment  Family History: Reviewed history from 11/17/2009 and no changes required. Noncontributory for early coronary disease. Family History of Prostate Cancer: all brothers, father No FH of Colon Cancer:  Social History: Reviewed history from 11/17/2009 and no changes required.  The patient smoked just very briefly most recently in the  last six months.  He quit a week ago.  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 Alcohol Use - yes 3 beers per day Daily Caffeine Use 2 per day Illicit Drug Use - no Packs/Day:  2.0  Review of Systems      See HPI  The patient denies anorexia, fever, weight loss, weight gain, vision loss, decreased hearing, hoarseness, chest pain, syncope, dyspnea on exertion, peripheral edema, prolonged cough, headaches, hemoptysis, abdominal pain, melena, hematochezia, severe indigestion/heartburn, hematuria, incontinence, muscle weakness, suspicious skin lesions, transient  blindness, difficulty walking, depression, unusual weight change, abnormal bleeding, enlarged lymph nodes, and angioedema.    Vital Signs:  Patient profile:   70 year old male Height:      71 inches Weight:      196.50 pounds BMI:     27.51 O2 Sat:      98 % on Room air Temp:     96.6 degrees F oral Pulse rate:   58 / minute BP sitting:   110 / 62  (left arm) Cuff size:   regular  Vitals Entered By: Randell Loop CMA (March 19, 2010 9:48 AM)  O2 Sat at Rest %:  98 O2 Flow:  Room air CC: 7 month ROV & add-on for fatigue & dizzy... Is Patient Diabetic? No Pain Assessment Patient in pain? no      Comments meds updated today with pt   Physical Exam  Additional Exam:  WD, WN, 70 y/o WM in NAD... GENERAL:  Alert & oriented; pleasant & cooperative... HEENT:  [place]/AT, EOM-wnl, PERRLA, EACs-clear, TMs-wnl, NOSE-clear, THROAT-clear & wnl. NECK:  Supple w/ fairROM; no JVD; normal  carotid impulses w/o bruits; no thyromegaly or nodules palpated; no lymphadenopathy. CHEST:  Clear to P & A; without wheezes/ rales/ or rhonchi heard... HEART:  Regular Rhythm; without murmurs/ rubs/ or gallops detected... ABDOMEN:  Soft & nontender; normal bowel sounds; no organomegaly or masses palpated... EXT: without deformities, mild arthritic changes; no varicose veins/ venous insuffic/ or edema. NEURO:  CN's intact; motor testing normal; sensory testing normal; gait normal & balance OK. DERM:  No lesions noted; no rash etc...    MISC. Report  Procedure date:  03/19/2010  Findings:      CBC Platelet w/Diff (CBCD)   White Cell Count          6.2 K/uL                    4.5-10.5   Red Cell Count            4.35 Mil/uL                 4.22-5.81   Hemoglobin                14.5 g/dL                   95.6-21.3   Hematocrit                41.2 %                      39.0-52.0   MCV                       94.8 fl                     78.0-100.0   Platelet Count            170.0 K/uL                   150.0-400.0   Neutrophil %         [L]  40.7 %                      43.0-77.0   Lymphocyte %              43.4 %                      12.0-46.0   Monocyte %                9.9 %                       3.0-12.0   Eosinophils%         [H]  5.1 %                       0.0-5.0   Basophils %               0.9 %                       0.0-3.0  BMP (METABOL)   Sodium                    138 mEq/L                   135-145   Potassium  4.5 mEq/L                   3.5-5.1   Chloride                  104 mEq/L                   96-112   Carbon Dioxide            27 mEq/L                    19-32   Glucose              [H]  110 mg/dL                   16-10   BUN                       19 mg/dL                    9-60   Creatinine                1.0 mg/dL                   4.5-4.0   Calcium                   9.0 mg/dL                   9.8-11.9   GFR                       76.21 mL/min                >60  TSH (TSH)   FastTSH                   1.51 uIU/mL                 0.35-5.50  Comments:      Hepatic/Liver Function Panel (HEPATIC)   Total Bilirubin           0.8 mg/dL                   1.4-7.8   Direct Bilirubin          0.1 mg/dL                   2.9-5.6   Alkaline Phosphatase      57 U/L                      39-117   AST                       29 U/L                      0-37   ALT                       30 U/L                      0-53   Total Protein             6.6 g/dL  6.0-8.3   Albumin                   4.0 g/dL                    5.7-8.4  Lipid Panel (LIPID)   Cholesterol               163 mg/dL                   6-962   Triglycerides             140.0 mg/dL                 9.5-284.1   HDL                       32.44 mg/dL                 >01.02   LDL Cholesterol           93 mg/dL                    7-25  Prostate Specific Antigen (PSA)   PSA-Hyb                   0.57 ng/mL                  0.10-4.00   Impression & Recommendations:  Problem #  1:  Fatigue>> Labs actually look good, therefore suspect it is from the incr stress that he is under... we discussed this & Rx w/ Alprazolam...  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  Problem # 3:  HYPERTENSION (ICD-401.9) BP controlled, no angina, etc... continue same meds. His updated medication list for this problem includes:    Atenolol 25 Mg Tabs (Atenolol) .Marland Kitchen... 1 tab by mouth once daily    Lisinopril 10 Mg Tabs (Lisinopril) ..... I by mouth daily  Problem # 4:  DYSLIPIDEMIA (ICD-272.4) He remains at goal on Prav20... His updated medication list for this problem includes:    Pravastatin Sodium 20 Mg Tabs (Pravastatin sodium) .Marland Kitchen... 1 tab by mouth at bedtime  Problem # 5:  GERD (ICD-530.81) He has recent EGD w/ dilatation... switched to Aciphex... The following medications were removed from the medication list:    Prilosec 20 Mg Cpdr (Omeprazole) .Marland Kitchen... Take 1 tablet daily His updated medication list for this problem includes:    Aciphex 20 Mg Tbec (Rabeprazole sodium) .Marland Kitchen... Take 1 tab daily 30 min before breakfast  Problem # 6:  PROSTATE CANCER (ICD-185) PSA is sl elevated at 0,57 but we don't have notes from DrPeterson... we will send copy to him to review...  Problem # 7:  ANXIETY>>> Under incr stress w/ work... we will Rx w/ Alprazolam Prn...  Complete Medication List: 1)  Adprin B 325 Mg Tabs (Aspirin buf(cacarb-mgcarb-mgo)) .... Once daily 2)  Atenolol 25 Mg Tabs (Atenolol) .Marland Kitchen.. 1 tab by mouth once daily 3)  Lisinopril 10 Mg Tabs (Lisinopril) .... I by mouth daily 4)  Pravastatin Sodium 20 Mg Tabs (Pravastatin sodium) .Marland Kitchen.. 1 tab by mouth at bedtime 5)  Aciphex 20 Mg Tbec (Rabeprazole sodium) .... Take 1 tab daily 30 min before breakfast 6)  Alprazolam 0.5 Mg Tabs (Alprazolam) .... Take 1/2 to 1 tab by mouth three times a day as needed  for nerves...  Other  Orders: Cardiology Referral (Cardiology)  Patient Instructions: 1)  Today we updated your med list- see below.... 2)  Today we added a low dose anxiety medication you can take as needed for the added stress- Alprazolam: start w/ 1/2 tab & incr to 1 tab if nec up to 3 times daily.Marland KitchenMarland Kitchen 3)  We did your follow up FASTING blood work... please call the "phone tree" in a few days for your lab results.Marland KitchenMarland Kitchen 4)  We will arrange for a follow up visit w/ DrHochrein to be sure nothing is going on w/ your heart... 5)  Call for any questions... Prescriptions: ALPRAZOLAM 0.5 MG TABS (ALPRAZOLAM) take 1/2 to 1 tab by mouth three times a day as needed for nerves...  #90 x 5   Entered and Authorized by:   Michele Mcalpine MD   Signed by:   Michele Mcalpine MD on 03/19/2010   Method used:   Print then Give to Patient   RxID:   [id number]

## 2010-08-17 NOTE — Assessment & Plan Note (Signed)
Summary: cpx ///kp   Primary Care Provider:  Dr. Kriste Basque  CC:  15 month ROV & review of mult medical problems....  History of Present Illness: 70 y/o WM here for a follow up visit... he has multiple medical problems as noted below...    May 27, 2008:  here for a "yearly check-up" doing well... still works full time in [employer] business... denies cough, phlegm, SOB, CP, palpit, edema, etc... he has already had his Flu shot this yr...   ~  August 19, 2009:  routine f/u doing well- business was slow & he did a job in [place] but it didn't work out, now picking back up... he is active- denies CP, palpit, SOB, swelling, etc... he fell on the ice w/ some incr in right shoulder discomfort- he will call DrDaldorf as needed... he had labs 5/10 from DrHochrein & doesn't want full labs today, due for Flu shot...    Current Problem List:  BRONCHITIS, RECURRENT (ICD-491.9) - no recent URI symptoms...  Hx of PULMONARY NODULE (ICD-518.89) - mult sm benign pulm nodules (?granulomas) on CTChest- last 12/07 without adenopathy, no change, +coronary calcif seen...   ~  f/u CXR 8/08 without nodules seen...  ~  f/u CXR 11/09 clear, no lesions seen...  ~  f/u CXR 2/11 showed clear, NAD...  HYPERTENSION (ICD-401.9) - contolled on ATENOLOL 25mg /d, & LISINOPRIL 10mg /d... BP=124/72 and doing well- denies HA, fatigue, visual changes, CP, palipit, dizziness, syncope, dyspnea, edema, etc...  CAD (ICD-414.00) - followed by DrHochrein... on ASA 325mg /d.  ~  NuclearStressTest 8/07 abnormal w/ ant ischemia...  ~  cath 8/07 showed 95% mid-LAD stenosis, & 20% lesions in the other 2 vessels, EF=60%... subseq PTCA w/ non-drug eluting stent...  ~  last OV w/ Cards= 5/10 & note reviewed- BP sl elevated & Lisinopril added.  DYSLIPIDEMIA (ICD-272.4) - on PRAVACHOL 20mg /d and tol OK...   ~  FLP 8/08 showed TChol 168, TG 143, HDL34, LDL105  ~  FLP 2/09 showed TChol 163, TG 207, HDL 34, LDL 91  ~  FLP 5/10 showed  TChol 148, TG 131, HDL 33, LDL 89  GERD (ICD-530.81) - on OMEPRAZOLE OTC 20mg /d... last EGD was 8/04 showing GERD & stricture dilated...  DIVERTICULOSIS OF COLON (ICD-562.10) - last colonoscopy 8/04 by DrPatterson was normal, f/u 76yrs (scattered tics seen on 1996 flex)  PROSTATE CANCER (ICD-185) - followed by DrPeterson- elevated PSA found 8/07 (10.6)... biopsies were pos & there is a family hx as well... after consultation at [facility] they rec XRT, then hormonal Rx for 2 yrs... XRT completed by [facility] 4/08... prev on TRELSTAR injections q 3months & PROVERA for the hot flashes>> off both now... neg bone scan 1/08...  ~  2/11: we don't have recent note from DrPeterson & pt will request info sent to [place] at next visit.  DEGENERATIVE JOINT DISEASE (ICD-715.90) - his left knee gives him some pain on and off... he saw DrDaldorf for this 12/08 & he thought there might be a torn meniscus, they decided to Rx w/ NAPROSEN which helps... may yet need MRI/ arthroscopy...  ~  2/11: discussed trial of Mobic to see if this works for him.    Allergies: 1)  ! Simvastatin (Simvastatin)  Comments:  Nurse/Medical Assistant: The patient's medications and allergies were reviewed with the patient and were updated in the Medication and Allergy Lists.  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 (Catheterization in 2007, demonstrating 95% mid- LAD stenosis.  The circumflex had 20% proximal stenosis, the right coronary artery had 20% mid stenosis.  The EF was 60%.  He had a non drug-eluting stent placed) (ICD-414.00) HYPERTENSION (ICD-401.9) DYSLIPIDEMIA (ICD-272.4) GERD (ICD-530.81) DIVERTICULOSIS OF COLON (ICD-562.10) PROSTATE CANCER (ICD-185) DEGENERATIVE JOINT DISEASE (ICD-715.90)  Past Surgical History:  Eyelid Surgery for Lid Lag Foot Surgery Hydrocele Repair Lung  Nodules, probably benign Mastectomy for benign tumor Inguinal hernia repair Post Stenting to the Mid LAD  Family History: Reviewed history from 10/14/2008 and no changes required. Noncontributory for early coronary disease.  Social History: Reviewed history from 10/14/2008 and no changes required.  The patient smoked just very briefly most recently in the  last six months.  He quit a week ago.  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  Review of Systems      See HPI       The patient complains of difficulty walking.  The patient denies anorexia, fever, weight loss, weight gain, vision loss, decreased hearing, hoarseness, chest pain, syncope, dyspnea on exertion, peripheral edema, prolonged cough, headaches, hemoptysis, abdominal pain, melena, hematochezia, severe indigestion/heartburn, hematuria, incontinence, muscle weakness, suspicious skin lesions, transient blindness, depression, unusual weight change, abnormal bleeding, enlarged lymph nodes, and angioedema.    Vital Signs:  Patient profile:   70 year old male Height:      71 inches Weight:      204.13 pounds O2 Sat:      95 % on Room air Temp:     97.4 degrees F oral Pulse rate:   57 / minute BP sitting:   124 / 72  (right arm) Cuff size:   regular  Vitals Entered By: Randell Loop CMA (August 19, 2009 10:50 AM)  O2 Sat at Rest %:  95 O2 Flow:  Room air CC: 15 month ROV & review of mult medical problems... Is Patient Diabetic? No Pain Assessment Patient in pain? yes      Onset of pain  right shoulder pain from falling on ice x 1 month ago Comments meds updated today   Physical Exam  Additional Exam:  WD, WN, 70 y/o WM in NAD... GENERAL:  Alert & oriented; pleasant & cooperative... HEENT:  [place]/AT, EOM-wnl, PERRLA, EACs-clear, TMs-wnl, NOSE-clear, THROAT-clear & wnl. NECK:  Supple w/ fairROM; no JVD; normal carotid impulses w/o bruits; no thyromegaly or nodules palpated; no  lymphadenopathy. CHEST:  Clear to P & A; without wheezes/ rales/ or rhonchi heard... HEART:  Regular Rhythm; without murmurs/ rubs/ or gallops detected... ABDOMEN:  Soft & nontender; normal bowel sounds; no organomegaly or masses palpated... EXT: without deformities, mild arthritic changes; no varicose veins/ venous insuffic/ or edema. NEURO:  CN's intact; motor testing normal; sensory testing normal; gait normal & balance OK. DERM:  No lesions noted; no rash etc...     CXR  Procedure date:  08/19/2009  Findings:      CHEST - 2 VIEW   Comparison: 05/27/2008   Findings: Trachea is midline.  Heart size normal.  Lungs are clear. No pleural fluid.   IMPRESSION: No acute findings.   Read By:  Reyes Ivan.,  M.D.       Impression & Recommendations:  Problem # 1:  Hx of PULMONARY NODULE (ICD-518.89) F/u CXR is clear... Orders: T-2 View CXR (71020TC)  Problem # 2:  HYPERTENSION (ICD-401.9)  BP improved on combo therapy... advised to  continue low sodium diet & exercise program... His updated medication list for this problem includes:    Atenolol 25 Mg Tabs (Atenolol) .Marland Kitchen... 1 tab by mouth once daily    Lisinopril 10 Mg Tabs (Lisinopril) ..... I by mouth daily  Orders: T-2 View CXR (71020TC)  Problem # 3:  CAD (ICD-414.00) Followed by drHochrein every 2mo... continue Rx. His updated medication list for this problem includes:    Adprin B 325 Mg Tabs (Aspirin buf(cacarb-mgcarb-mgo)) ..... Once daily    Atenolol 25 Mg Tabs (Atenolol) .Marland Kitchen... 1 tab by mouth once daily    Lisinopril 10 Mg Tabs (Lisinopril) ..... I by mouth daily  Problem # 4:  DYSLIPIDEMIA (ICD-272.4) Continue the Prav20... His updated medication list for this problem includes:    Pravastatin Sodium 20 Mg Tabs (Pravastatin sodium) .Marland Kitchen... 1 tab by mouth at bedtime  Problem # 5:  GERD (ICD-530.81) GI is stable-  same Rx. His updated medication list for this problem includes:    Prilosec 20 Mg Cpdr  (Omeprazole) .Marland Kitchen... Take 1 tablet daily  Problem # 6:  PROSTATE CANCER (ICD-185) GU by DrPeterson... pt will request records to [place]...  Problem # 7:  DEGENERATIVE JOINT DISEASE (ICD-715.90) AS noted-  try Mobic, f/u w/ Ortho Prn... His updated medication list for this problem includes:    Adprin B 325 Mg Tabs (Aspirin buf(cacarb-mgcarb-mgo)) ..... Once daily    Meloxicam 7.5 Mg Tabs (Meloxicam) .Marland Kitchen... Take 1 tab by mouth once daily as needed for arthritis pain...  Problem # 8:  OTHER MEDICAL PROBLEMS AS NOTED>>>  Complete Medication List: 1)  Adprin B 325 Mg Tabs (Aspirin buf(cacarb-mgcarb-mgo)) .... Once daily 2)  Atenolol 25 Mg Tabs (Atenolol) .Marland Kitchen.. 1 tab by mouth once daily 3)  Lisinopril 10 Mg Tabs (Lisinopril) .... I by mouth daily 4)  Pravastatin Sodium 20 Mg Tabs (Pravastatin sodium) .Marland Kitchen.. 1 tab by mouth at bedtime 5)  Prilosec 20 Mg Cpdr (Omeprazole) .... Take 1 tablet daily 6)  Meloxicam 7.5 Mg Tabs (Meloxicam) .... Take 1 tab by mouth once daily as needed for arthritis pain...  Other Orders: Prescription Created Electronically [phone]) Flu Vaccine 45yrs + [phone]) Administration Flu vaccine - MCR ([id number])  Patient Instructions: 1)  Today we updated your med list- see below.... 2)  We refilled your meds for 2011... 3)  We gave you the 2010 Flu vaccine as well... 4)  Today we did your follow up CXR>> please call the "phone tree" in a few days for your results.Marland KitchenMarland Kitchen 5)  Call for any questions.Marland KitchenMarland Kitchen 6)  Please schedule a follow-up appointment in 1 year, sooner as needed... Prescriptions: PRILOSEC 20 MG CPDR (OMEPRAZOLE) Take 1 tablet daily Brand medically necessary #90 x prn   Entered and Authorized by:   Michele Mcalpine MD   Signed by:   Michele Mcalpine MD on 08/19/2009   Method used:   Print then Give to Patient   RxID:   [id number] PRAVASTATIN SODIUM 20 MG  TABS (PRAVASTATIN SODIUM) 1 tab by mouth at bedtime  #90 x prn   Entered and Authorized by:   Michele Mcalpine MD   Signed by:    Michele Mcalpine MD on 08/19/2009   Method used:   Print then Give to Patient   RxID:   [id number] LISINOPRIL 10 MG TABS (LISINOPRIL) I by mouth DAILY  #90 x prn   Entered and Authorized by:   Michele Mcalpine MD   Signed by:   Michele Mcalpine  MD on 08/19/2009   Method used:   Print then Give to Patient   RxID:   [id number] ATENOLOL 25 MG  TABS (ATENOLOL) 1 tab by mouth once daily  #90 x prn   Entered and Authorized by:   Michele Mcalpine MD   Signed by:   Michele Mcalpine MD on 08/19/2009   Method used:   Print then Give to Patient   RxID:   [id number] MELOXICAM 7.5 MG TABS (MELOXICAM) take 1 tab by mouth once daily as needed for arthritis pain...  #30 x 5   Entered and Authorized by:   Michele Mcalpine MD   Signed by:   Michele Mcalpine MD on 08/19/2009   Method used:   Print then Give to Patient   RxID:   [id number]     Flu Vaccine Consent Questions     Do you have a history of severe allergic reactions to this vaccine? no    Any prior history of allergic reactions to egg and/or gelatin? no    Do you have a sensitivity to the preservative Thimersol? no    Do you have a past history of Guillan-Barre Syndrome? no    Do you currently have an acute febrile illness? no    Have you ever had a severe reaction to latex? no    Vaccine information given and explained to patient? yes    Are you currently pregnant? no    Lot Number:[id number]   Exp Date:01/14/2010   Site Given  Left Deltoid IMflu   Randell Loop CMA  August 19, 2009 11:37 AM

## 2010-08-17 NOTE — Progress Notes (Signed)
  Phone Note Outgoing Call   Call placed by: Joselyn Glassman,  Nov 18, 2009 4:19 PM Call placed to: Patient Summary of Call: I called and did a Prior authorization with Wille Glaser at [phone].  They approved the Aciphext until 07-17-10. They suggested omeprazole.  I told them per the pt that he did try Nexium a few years ago.  The form I have said they suggest Omeprazole and Nexium. The pharmacy said even though they approved the perscription for Aciphex, it will be $121.00 a month.  The pt said that is too much. He will finish the Aciphex sampes which are helping and he will try his wifes Nexium, he said she isn't taking it right now.   Initial call taken by: Joselyn Glassman,  Nov 18, 2009 4:24 PM  Follow-up for Phone Call        OK, THAT IS FINE-IF HE TOLERATES THE NEXIUM WE CAN GIVE HIM AN RX FOR THAT. Follow-up by: Sammuel Cooper PA-c,  Nov 18, 2009 5:00 PM

## 2010-08-17 NOTE — Letter (Signed)
Summary: EGD Instructions  [facility]  [address] [place], [place] [address]   Phone: [phone]  Fax: [phone]       [name]    [birth year]    MRN: [id number]       Procedure Day /Date:11-23-09     Arrival Time: 3:00 PM      Procedure Time: 4:00 PM     Location of Procedure:                    X    [facility] (4th Floor) PREPARATION FOR ENDOSCOPY   On 11-23-09 THE DAY OF THE PROCEDURE:  1.   No solid foods, milk or milk products are allowed after midnight the night before your procedure.  2.   Do not drink anything colored red or purple.  Avoid juices with pulp.  No orange juice.  3.  You may drink clear liquids until 2:00 PM , which is 2 hours before your procedure.                                                                                                CLEAR LIQUIDS INCLUDE: Water Jello Ice Popsicles Tea (sugar ok, no milk/cream) Powdered fruit flavored drinks Coffee (sugar ok, no milk/cream) Gatorade Juice: apple, white grape, white cranberry  Lemonade Clear bullion, consomm, broth Carbonated beverages (any kind) Strained chicken noodle soup Hard Candy   MEDICATION INSTRUCTIONS  Unless otherwise instructed, you should take regular prescription medications with a small sip of water as early as possible the morning of your procedure.         OTHER INSTRUCTIONS  You will need a responsible adult at least 69 years of age to accompany you and drive you home.   This person must remain in the waiting room during your procedure.  Wear loose fitting clothing that is easily removed.  Leave jewelry and other valuables at home.  However, you may wish to bring a book to read or an iPod/MP3 player to listen to music as you wait for your procedure to start.  Remove all body piercing jewelry and leave at home.  Total time from sign-in until discharge is approximately 2-3 hours.  You should go home directly after your  procedure and rest.  You can resume normal activities the day after your procedure.  The day of your procedure you should not:   Drive   Make legal decisions   Operate machinery   Drink alcohol   Return to work  You will receive specific instructions about eating, activities and medications before you leave.    The above instructions have been reviewed and explained to me by   _______________________    I fully understand and can verbalize these instructions _____________________________ Date _________

## 2010-08-17 NOTE — Progress Notes (Signed)
Summary: Triage: Dysphagia  Phone Note Call from Patient   Caller: [name] @ Dr Kriste Basque Call For: Dr Jarold Motto Reason for Call: Talk to Nurse Summary of Call: Has an appointmanent on 12-15-09 but is having difficulty swallowing liquids. Wonders if we can work in even with PA. Initial call taken by: Leanor Kail [facility],  Nov 17, 2009 10:03 AM  Follow-up for Phone Call        Appt sch for pt to see Amy Esterwood PA this pm. [name] notified. Follow-up by: Ashok Cordia RN,  Nov 17, 2009 10:15 AM

## 2010-08-31 ENCOUNTER — Telehealth: Payer: Self-pay | Admitting: Pulmonary Disease

## 2010-09-08 NOTE — Progress Notes (Signed)
  Phone Note Refill Request Message from:  Fax from Pharmacy  Refills Requested: Medication #1:  PRAVASTATIN SODIUM 20 MG  TABS 1 tab by mouth at bedtime   Supply Requested: 3 months Initial call taken by: Zackery Barefoot CMA,  August 31, 2010 11:15 AM    Prescriptions: PRAVASTATIN SODIUM 20 MG  TABS (PRAVASTATIN SODIUM) 1 tab by mouth at bedtime  #90 x 0   Entered by:   Zackery Barefoot CMA   Authorized by:   Michele Mcalpine MD   Signed by:   Zackery Barefoot CMA on 08/31/2010   Method used:   Electronically to        [facility]  [address] (retail)       [address]       [place], [place]  [address]       Ph: [phone]       Fax: [phone]   RxID:   [id number]

## 2010-09-20 ENCOUNTER — Encounter: Payer: Self-pay | Admitting: Pulmonary Disease

## 2010-10-05 NOTE — Letter (Signed)
[facility]: [facility]   Imported By: Sherian Rein 09/30/2010 10:00:02  _____________________________________________________________________  External Attachment:    Type:   Image     Comment:   External Document

## 2010-11-30 NOTE — Assessment & Plan Note (Signed)
[facility]                            CARDIOLOGY OFFICE NOTE   PHELAN, SCHADT                        MRN:          [id number]  DATE:06/27/2007                            DOB:          02-01-1941    PRIMARY CARE PHYSICIAN:  Dr. Alroy Dust.   REASON FOR PRESENTATION:  Evaluate patient with chest pain and coronary  stenting.   HISTORY OF PRESENT ILLNESS:  The patient called to have himself  scheduled.  I was not planning on seeing him for a year; however, he had  chest discomfort.  This happened last week.  He noticed a stinging  discomfort substernally.  This happened when he would get up in the  morning and get to work.  He would notice them when he started getting  busy.  It was not at all like his previous angina.  It would last for a  short while and then go away.  There were no associated symptoms such as  nausea, vomiting, or diaphoresis.  There was no radiation to his jaw or  to his arms.  He does not know whether he has ever had discomfort like  this before.  This weekend he was able to do some walking in the woods  with his dogs and not bring on any symptoms.  Over the last few days, he  is not having any further discomfort.  He is not having any shortness of  breath, denies any PND or orthopnea.  He has had no palpitations,  presyncope, or syncope.   PAST MEDICAL HISTORY:  1. Coronary artery disease (anterior ischemia on a stress perfusion      study, catheterization August 2007 demonstrated 95% mid left      anterior descending stenosis, the circumflex had a 20% proximal      stenosis, the right coronary artery had a 20% mid stenosis, the      ejection fraction 60%.  He had a non drug-eluting stent).  2. Dyslipidemia.  3. Prostate cancer.  4. Inguinal hernia.  5. Mastectomy for benign tumor.  6. Lung nodules, probably benign.  7. Hydrocele repair.  8. Foot surgery.  9. Eyelid surgery for lid lag.   ALLERGIES:  None.   MEDICATIONS:  1. Atenolol 25 mg daily.  2. Aciphex 20 mg daily.  3. Aspirin 325 mg daily.  4. Pravastatin 20 mg daily.  5. Birth control.  6. Estrogen shots.   REVIEW OF SYSTEMS:  As stated in the HPI and otherwise negative for  other systems.   PHYSICAL EXAMINATION:  Patient is in no distress.  Blood pressure 138/82, heart rate 56 and regular, weight 205 pounds,  body mass index 28.  HEENT:  Eyelids unremarkable, pupils are equal, round, and reactive to  light, fundi not visualized.  Oral mucosa unremarkable.  NECK:  No jugular venous distension, wave form within normal limits,  carotid upstroke brisk and symmetrical.  No bruit.  No thyromegaly.  LYMPHATICS:  No cervical, axillary, inguinal adenopathy.  LUNGS:  Clear to auscultation bilaterally.  BACK:  No costovertebral angle tenderness.  CHEST:  Unremarkable.  HEART:  PMI not displaced or sustained.  S1 and S2 are within normal  limits.  No S3, no S4.  No clicks, no rubs, no murmurs.  ABDOMEN:  Flat, positive bowel sounds, normal in frequency and pitch.  No bruits, no rebound, no guarding.  No midline pulsatile mass.  No  hepatomegaly, no splenomegaly.  SKIN:  No rashes, no nodules.  EXTREMITIES:  2+ pulses throughout, no edema.  No cyanosis, no clubbing.  NEURO:  Oriented to person, place, and time.  Cranial nerves II-XII  grossly intact, motor grossly intact.   EKG sinus rhythm, rate 56, axis within normal limits, intervals within  normal limits, orderly transition lead V2, no acute ST-T wave changes.   ASSESSMENT AND PLAN:  1. Coronary disease and chest pain:  The patient is having symptoms      that I believe to be atypical for angina.  At this point I do not      think further cardiovascular testing is suggested with this      isolated episode of discomfort unless he has recurrence or more      typical symptoms.  For now he will continue the medications as      listed.  2. Dyslipidemia:  This is followed by Dr.  Kriste Basque.  The goal is an LDL      less than 100 and HDL greater than 40.  Last one was checked in      August.  His HDL was 34.  The LDL was 105.  Dr. Kriste Basque maintained      him on pravastatin 20.  I would suggest that if this is repeated      and at this level he go up to 40 mg.  I encouraged him to exercise      more which may bring up his HDL.  3. Prostate cancer:  The patient is on estrogen therapy.      Interestingly, I suspect this will increase his risk slightly for      cardiovascular events, the same as it would for woman treated with      hormone replacement therapy.  4. Lung nodules, probably benign and followed by Dr. Kriste Basque.  5. Followup:  I will see the patient again in 6 months or sooner if      needed.     Rollene Rotunda, MD, [facility]  Electronically Signed    JH/MedQ  DD: 06/27/2007  DT: 06/28/2007  Job #: [id number]   cc:   Lonzo Cloud. Kriste Basque, MD

## 2010-11-30 NOTE — Assessment & Plan Note (Signed)
[facility]                            CARDIOLOGY OFFICE NOTE   JASER, FULLEN                        MRN:          [id number]  DATE:10/31/2007                            DOB:          05/08/1941    REASON FOR PRESENTATION:  Evaluate patient with coronary disease.   HISTORY OF PRESENT ILLNESS:  The patient presents for follow-up after  being added to the schedule in December with chest discomfort.  He does  occasionally get some chest pain.  However, it is some isolated to the  scar on his left breast where he had a lumpectomy.  He thinks this is  sporadic.  He is able to exercise 40 minutes every day and chop wood  without bringing on any discomfort.  He has none of the discomforts that  he had at the time of his catheterization and stress test in 2007.  He  has no chest pressure, neck or arm discomfort.  He does not have any  palpitation, presyncope or syncope.  He is having no shortness of  breath.   PAST MEDICAL HISTORY:  Coronary artery disease (anterior ischemia on a  stress perfusion study.  Catheterization in 2007, demonstrating 95% mid-  LAD stenosis.  The circumflex had 20% proximal stenosis, the right  coronary artery had 20% mid stenosis.  The EF was 60%.  He had a non  drug-eluting stent placed.  Dyslipidemia, prostate cancer, inguinal  hernia repair, mastectomy for a benign tumor.  Lung nodules, (these have  since resolved).  Hydrocele repair, foot surgery, eyelid surgery for lid  lag.   ALLERGIES:  NONE.   MEDICATIONS:  1. Atenolol 25 mg daily.  2. Aciphex.  3. Aspirin 325 mg daily.  4. Pravastatin 20 mg daily.  5. Birth control pills, estrogen shots.   REVIEW OF SYSTEMS:  As stated in the HPI, and otherwise negative for  other systems.   PHYSICAL EXAMINATION:  GENERAL:  The patient is in no distress.  VITAL SIGNS:  Blood pressure 146/93, heart 63 and regular.  HEENT:  Eyes unremarkable.  Pupils equal, round and reactive  to light.  Fundi not visualized.  NECK:  No jugulovenous distention at 45 degrees.  Carotid upstroke brisk  and symmetric.  LUNGS:  Clear to auscultation bilaterally.  CHEST:  Unremarkable, except for a small surgical well-healed scar under  his left nipple.  HEART:  PMI not displaced or sustained, S1-S2 within normal limits.  No  S3-S4.  No clicks, rubs or murmurs.  ABDOMEN:  Mildly obese.  Positive bowel sounds, normal in frequency and  pitch.  No bruits, rebound or guarding.  No midline pulsatile mass.  No  hepatomegaly or splenomegaly.  SKIN:  No rashes, denies.  EXTREMITIES:  2+ pulses, no edema.   EKG sinus rhythm, rate 67, left axis deviation, intervals within normal  limits, early transition lead V2, no acute ST-wave changes.   ASSESSMENT/PLAN:  1. Coronary disease.  He is having no symptoms consistent with angina.      No further cardiovascular testing is suggested.  He will continue  with risk reduction.  2. Dyslipidemia.  I did review his lipids.  His LDL was less than 100.      His HDL was slightly low at 34.1 and his triglycerides high at 207.      I agree with further medical management.  He can tighten up his      diet he admits.  He can exercise more.  This will be followed by      Dr. Kriste Basque going forward.  3. Hypertension.  Blood pressure is slightly elevated, but this is      unusual.  This may be related to weight gain and associated with      his birth control pills and estrogen.  He is going to concentrate      on weight loss and exercise and keep a blood pressure diary.  He      can share this with Dr. Kriste Basque or drop it off for me.   FOLLOW-UP:  I will see him back in 1 year, or sooner if needed.     Rollene Rotunda, MD, [facility]  Electronically Signed    JH/MedQ  DD: 10/31/2007  DT: 10/31/2007  Job #: [id number]   cc:   Lonzo Cloud. Kriste Basque, MD

## 2010-12-03 NOTE — Assessment & Plan Note (Signed)
[facility]                            CARDIOLOGY OFFICE NOTE   [name], [name]                        MRN:          [id number]  DATE:11/02/2006                            DOB:          October 09, 1940    PRIMARY CARE PHYSICIAN:  Dr. Alroy Dust.   REASON FOR PRESENTATION:  Evaluate patient with coronary disease status  post stenting.   HISTORY OF PRESENT ILLNESS:  The patient returns for followup.  Since I  last saw him, he has been active at work but has not been exercising.  He has had no new symptoms.  In particular, he has had no chest  discomfort, neck or arm discomfort.  He has had no shortness of breath.  He has had no PND or orthopnea.  He has had no palpitations, presyncope,  or syncope.  He has been followed for his prostate and had radiation  therapy and completed this.  He is followed for lung nodules, which are  felt at this point to be benign and stable.   PAST MEDICAL HISTORY:  1. Coronary artery disease (anterior ischemia, no stress perfusion      study.  Catheterization in August 2007 demonstrated 95% mid LAD      stenosis.  The circumflex had 20% proximal stenosis.  The right      coronary artery at 20% mid stenosis, the EF was 60%.  He had a non      drug-eluting stent).  2. Dyslipidemia.  3. Prostate cancer.  4. Inguinal hernia repair.  5. Mastectomy for benign tumor.  6. Lung nodules, probably benign.  7. Hydrocele repair.  8. Foot surgery.  9. Eyelid surgery for lid lag.   ALLERGIES:  None.   CURRENT MEDICATIONS:  1. Atenolol 25 mg daily.  2. Aciphex 20 mg daily.  3. Aspirin 325 mg daily.  4. Simvastatin 40 mg daily.   REVIEW OF SYSTEMS:  As stated in the HPI and otherwise negative for  other systems.   PHYSICAL EXAMINATION:  Patient is in no distress.  Blood pressure 125/80, heart rate 59 and irregular, weight 201 pounds,  body mass index 28.  NECK:  No jugular venous distension at 45 degrees, carotid  upstroke  brisk and symmetrical.  No bruit.  No thyromegaly.  LYMPHATICS:  No adenopathy.  LUNGS:  Clear to auscultation bilaterally.  HEART:  PMI not displaced or sustained.  S1 and S2 are within normal  limits.  No S3, no S4.  No clicks, no rubs, no murmurs.  ABDOMEN:  Flat, positive bowel sounds, normal in frequency and pitch.  No bruits, no rebound, no guarding.  No midline pulsatile mass.  No  organomegaly.  SKIN:  No rashes, no nodules.  EXTREMITIES:  2+ pulses, no edema.   ASSESSMENT AND PLAN:  1. Coronary disease.  Patient is having no new symptoms.  He will      participate in the secondary risk reduction.  He will continue the      medications as listed.  I have encouraged exercise and gave him  specific instructions about this.  This will also raise his      slightly low HDL.  2. Followup.  I will see him back in 1 year or sooner if needed.     Rollene Rotunda, MD, [facility]  Electronically Signed    JH/MedQ  DD: 11/02/2006  DT: 11/02/2006  Job #: [id number]   cc:   Lonzo Cloud. Kriste Basque, MD

## 2010-12-03 NOTE — Assessment & Plan Note (Signed)
[facility]                              CARDIOLOGY OFFICE NOTE   [name], [name]                        MRN:          [id number]  DATE:03/16/2006                            DOB:          August 01, 1940    REASON FOR PRESENTATION:  Evaluate patient with a recent PCA and stent.   HISTORY OF PRESENT ILLNESS:  The patient is a pleasant 70 year old gentleman  who is referred by Dr. Kriste Basque for evaluation of abnormal EKG and new onset  exertional chest discomfort (unstable angina).  He had a stress perfusion  study which demonstrated anterior ischemia.  He subsequently had a  catheterization demonstrating normal left main.  There was a 95% stenosis in  the mid LAD, the circumflex had 20% proximal stenosis at first obtuse  marginal.  The right coronary artery had 20% mid stenosis.  The EF was  approximately 60%.  Because of his new symptoms representing unstable angina  and his Cardiolite, he underwent angioplasty.  He had a non-drug eluding  stent because it was known that he had an elevated prostate with the  possibility of prostate biopsy.  He was noted to have an abnormal chest x-  ray with the results returning after his procedure.  This demonstrated  bilateral pulmonary nodules.  A CT done on March 14, 2006, confirms  pulmonary nodules with possible metastatic disease as a primary.   The patient now returns.  He is having no further chest discomfort and has  been able to walk without this.  He denies any chest pressure, neck  discomfort, arm discomfort, activity-induced nausea, vomiting, diarrhea,  excessive diaphoresis.  He has had no palpitations.  No presyncope or  syncope.  He denies any PND or orthopnea.  He has had no groin pain.   PAST MEDICAL HISTORY:  1. Dyslipidemia.  2. Coronary artery disease, as described.  3. Elevated PSA.  4. Inguinal hernia repair.  5. Mastectomy for benign tumor.  6. Hydrocele repair.  7. Foot surgery.  8.  Eyelid surgery for lid lag.   ALLERGIES:  None.   CURRENT MEDICATIONS:  1. Plavix 75 mg q. day.  2. Atenolol 50 mgq. day.  3. AcipHex.  4. Zocor 40 mg q.h.s.  5. Multivitamin.   REVIEW OF SYSTEMS:  As stated in the HPI, otherwise negative for other  systems.   PHYSICAL EXAMINATION:  GENERAL:  The patient is in no distress.  VITAL SIGNS:  Blood pressure 106/78.  Heart 58 and regular.  Weight 190  pounds.  HEENT:  Eyes: Unremarkable.  Pupils equal round react to light.  Fundi are  visualized.  Oral mucosa unremarkable.  NECK:  No jugular venous distention.  Wave form within normal limits.  Carotid upstroke brisk and symmetrical.  No bruits.  No thyromegaly.  LYMPHATICS:  No adenopathy.  LUNGS:  Clear to auscultation. Bilaterally.  BACK:  No costovertebral angle tenderness.  CHEST:  Unremarkable.  HEART:  PMI not displaced or sustained.  S1 and S2 within normal limits.  No  S3, no S4, no murmurs.  ABDOMEN:  Flat,  positive bowel sounds.  Normal in frequency and pitch.  No  bruits.  No guarding.  No rebounding.  No midline pulse.  No mass or  hepatosplenomegaly.  SKIN:  No rashes.  EXTREMITIES:  Pulses 1+, no edema.  Right groin without bruit or pulsatile  mass.   EKG sinus rhythm with premature ventricular contractions in a bigeminal  pattern, access leftward, early transition in lead V2, no acute ST wave  changes.   ASSESSMENT AND PLAN:  1. Coronary disease.  The patient is having no symptoms with respect to      this.  He is going to practice secondary risk reduction.  No further      cardiovascular testing is suggested.  2. Dyslipidemia.  He was put on Zocor.  He has been given written      instructions to get a liver profile and lipids done in six weeks      fasting.  3. Abnormal chest CT.  I have discussed this with Dr. Kriste Basque who will call      the patient for an appointment soon.  4. Elevated PSA.  He had been on antibiotics and is to be followed by Dr.       Vonita Moss.  5. Followup.  I would like to see him back in two months.                               Rollene Rotunda, MD, [facility]    JH/MedQ  DD:  03/16/2006  DT:  03/16/2006  Job #:  [id number]   cc:   Maretta Bees. Vonita Moss, MD  Lonzo Cloud. Kriste Basque, MD

## 2010-12-03 NOTE — Discharge Summary (Signed)
[name], [name] NO.:  [id number]   MEDICAL RECORD NO.:  [id number]          PATIENT TYPE:  INP   LOCATION:  6531                         FACILITY:  [facility]   PHYSICIAN:  Tereso Newcomer, PA-C     DATE OF BIRTH:  March 06, 1941   DATE OF ADMISSION:  03/02/2006  DATE OF DISCHARGE:  03/03/2006                                 DISCHARGE SUMMARY   REASON FOR ADMISSION:  Single vessel coronary artery disease.   DISCHARGE DIAGNOSES:  1. Coronary artery disease.      a.     Status post stenting to the mid LAD this admission with an       express 2 Monorail stent.      b.     Good left ventricular function.  2. History of elevated PSA.      a.     Followed by Dr. Peterson-currently on antibiotics.  3. Treated dyslipidemia.  4. History of inguinal hernia repair.  5. History of mastectomy for benign tumor.  6. History of hydrocele repair.  7. History of foot surgery.  8. History of eyelid surgery for lid lag.   ALLERGIES:  NONE.   PROCEDURES PERFORMED THIS ADMISSION:  1. Percutaneous coronary intervention by Dr. Randa Evens March 02, 2006.  2. Mid LAD stenosis of 95% reduced to 0% with an express 2 Monorail stent.   BRIEF HISTORY AND PHYSICAL:  [name] is a 70 year old male patient  initially evaluated on February 23, 2006.  He complained of chest discomfort  that was worrisome for new onset exertional angina.  He underwent evaluation  with Myoview study that was felt to be normal.  He was set up for cardiac  catheterization that was done on February 28, 2006 by Dr. Diona Browner.  This was  done in the outpatient lab.  The patient had a mid RCA lesion of 20%,  proximal obtuse marginal of 20% and mid LAD lesion of 95%.  There was an EF  of 60%.  He was brought back for percutaneous intervention of his LAD.   HOSPITAL COURSE:  The patient entered [facility] March 02, 2006  electively for a percutaneous coronary intervention.  He underwent the  procedure  with Dr. Samule Ohm.  There were no immediate complications.  He  tolerated the procedure well.  On the morning of March 03, 2006 he was  found to be in stable condition without chest pain or shortness of breath.  He was evaluated by Dr. Caprice Red.  It was felt that he was stable enough for  discharge to home.  He had a bare metal stent placed due to a history of  elevated PSA and possible need for prostate biopsy in the future.  He will  need to remain on Plavix for at least 1 month.  He will followup with Dr.  Caprice Red in 2 weeks.   LABORATORY DATA:  White count 7300, hemoglobin 14.6, hematocrit 41.5,  platelet count 153,000.  INR 0.9, sodium 139, potassium 4.6, chloride 106,  CO2 26, glucose 102, BUN 11,  creatinine 1.0.  Calcium 8.9.  Post procedure  CK-MB 1.4.   DISCHARGE MEDICATIONS:  1. Plavix 75 mg daily.  2. Enteric coated aspirin 325 mg daily.  3. Atenolol 50 mg daily.  4. AcipHex 20 mg daily.  5. Zocor 40 mg nightly.  6. Multivitamin daily.  7. Nitroglycerin p.r.n. chest pain.   DIET:  Low fat, low sodium.   ACTIVITY:  The patient is to increase activity slowly.  No driving, heavy  lifting, exertional activity for a few days.  He may shower.  He may walk up  steps.  Wound care the patient is to call if any fever.  The patient will  consider cardiac rehabilitation.  Followup is with Dr. Caprice Red on March 16, 2006 at 8:45 a.m.           ______________________________  Tereso Newcomer, PA-C     SW/MEDQ  D:  03/03/2006  T:  03/03/2006  Job:  [id number]

## 2010-12-03 NOTE — Assessment & Plan Note (Signed)
[facility]                              CARDIOLOGY OFFICE NOTE   Love, [name]                        MRN:          [id number]  DATE:04/27/2006                            DOB:          1940/12/16    PRIMARY CARE PHYSICIAN:  Lonzo Cloud. Kriste Basque, M.D.   REASON FOR PRESENTATION:  Evaluate patient with a recent stent.   HISTORY OF PRESENT ILLNESS:  The patient returns for followup with his  coronary anatomy and recent stenting as described below.  He did have some  pulmonary nodules and is being evaluated by Dr. Kriste Basque.  He had a CT.  It is  not clear the etiology of these nodules and he is being followed currently  by Dr. Kriste Basque.  He is also being followed by Dr. Vonita Moss for an elevated PSA  and is due to have a biopsy.   The patient has had no symptoms.  He has had no chest discomfort.  He has  been active.  He denies any shortness of breath, PND, or orthopnea.  He has  had no palpitations, presyncope or syncope.  He has been taking his medicine  except his Statin.  He has been off of this.  He did have some very mildly  elevated liver enzymes in the past month, so we will watch this very  closely.   PAST MEDICAL HISTORY:  1. Coronary artery disease (stress perfusion study with anterior ischemia.      Catheterization demonstrated normal left main, 95% stenosis in the mid      LAD.  The circumflex had a 20% proximal stenosis of the first obtuse      marginal. The right coronary artery had a 20% mid stenosis.  EF 60%.      He underwent a non-drug eluting stent placement.  He completed a      month's worth of Plavix).  2. Dyslipidemia.  3. Elevated PSA.  4. Inguinal hernia.  5. Mastectomy for benign tumor.  6. Lung nodules.  7. Hydrocele repair.  8. Foot surgery.  9. Eyelid surgery for lid lag.   ALLERGIES:  NONE.   MEDICATIONS:  1. Atenolol 25 mg a day.  2. Aciphex 20 mg a day.  3. Aspirin 325 mg every day.   REVIEW OF SYSTEMS:  As  stated in the HPI and otherwise negative for other  systems.   PHYSICAL EXAMINATION:  GENERAL:  The patient is in no distress.  VITAL SIGNS:  Blood pressure 122/75, heart rate 56 and regular.  Body mass  index 27.  Weight 191 pounds.  HEENT:  Eyes unremarkable.  Pupils are equal, round, and reactive to light.  Fundi not visualized.  Oral mucosa unremarkable.  NECK:  No jugular venous distention.  Wave form within normal limits.  Carotid upstroke brisk and symmetric.  LUNGS:  Clear to auscultation bilaterally.  HEART:  PMI not displaced or sustained.  S1 and S2 within normal limits.  No  S3, no S4, no murmurs.  ABDOMEN:  Flat, positive bowel sounds, normal in frequency and pitch.  No  bruits.  No rebound.  No guarding.  EXTREMITIES:  With 2+ pulses.  No edema.   ASSESSMENT/PLAN:  1. Coronary disease.  The patient is having no ongoing symptoms.  No      further cardiovascular testing is suggested.  He is okay for his      planned prostate biopsy by Dr. Vonita Moss.  2. Dyslipidemia.  It looks like he was to be taking Lipitor but this      stopped.  I am going to put him back on 40 mg of Lipitor and make sure      he gets written instructions to get a liver and lipids done in 6 weeks,      since he had mildly enzymes.  3. Abnormal chest CT.  He will have this followed by Dr. Kriste Basque.   FOLLOWUP:  I will see the patient back in 6 months and then hopefully yearly  thereafter.            ______________________________  Rollene Rotunda, MD, [facility]     JH/MedQ  DD:  04/27/2006  DT:  04/28/2006  Job #:  [id number]   cc:   Lonzo Cloud. Kriste Basque, MD  Maretta Bees. Vonita Moss, M.D.

## 2010-12-03 NOTE — Cardiovascular Report (Signed)
NAMEDIAZ, CRAGO NO.:  [id number]   MEDICAL RECORD NO.:  [id number]          PATIENT TYPE:  OIB   LOCATION:  1965                         FACILITY:  [facility]   PHYSICIAN:  Jonelle Sidle, MD DATE OF BIRTH:  12-23-1940   DATE OF PROCEDURE:  02/28/2006  DATE OF DISCHARGE:                              CARDIAC CATHETERIZATION   REQUESTING CARDIOLOGIST:  Dr. Angelina Sheriff   INDICATION:  [name] is a pleasant 70 year old male with a history of  hypertension.  He has experienced relatively new onset exertional chest pain  consistent with exertional angina over the last few weeks and was referred  for a myocardial perfusion study.  This study indicated an ejection fraction  of 58% with abnormal ST-segment changes at peak exercise in the setting of  chest pain and with perfusion data demonstrating a small reversible defect  in the apex as well as the septal distribution consistent with ischemia.  These results were discussed with the patient by Dr. Antoine Poche and he was  subsequently referred for a diagnostic cardiac catheterization with an eye  towards potential revascularization strategies.  The risks and benefits of  the procedure were explained to the patient in advance and informed consent  was obtained.   PROCEDURES PERFORMED:  1. Left heart catheterization.  2. Selective coronary angiography.  3. Ventriculography.   ACCESS AND EQUIPMENT:  The area about the right femoral artery was  anesthetized with 1% lidocaine and a 4-French sheath was placed in the right  femoral artery via the modified Seldinger technique.  Standard preformed 4-  [place] JL-4 and 3-D RC catheters were used for selective coronary  angiography and an angled pigtail catheter was used for left heart  catheterization and left ventriculography.  All exchanges were made over a  wire.  A total of 85 mL Omnipaque were used.  The patient tolerated the  procedure well without immediate  complications.   HEMODYNAMIC RESULTS:  Aorta 113/62 mmHg.  Left ventricle 113/9 mmHg.   ANGIOGRAPHIC FINDINGS:  1. The left main coronary artery is calcified, particularly its distal      aspect.  There is no significant flow-limiting stenoses noted.  The      vessel gives rise to the left anterior descending and circumflex      coronary arteries.  2. The left anterior descending is a medium-caliber vessel with a      relatively large proximal diagonal branch as well as a mid-level      diagonal branch.  There is a fairly focal high-grade stenosis within      the mid left anterior descending, approximately 95%.  Flow within the      distal vessel is somewhat competitive due to relatively well-developed      right-to-left collaterals.  3. The circumflex coronary artery is a medium-caliber vessel with two      obtuse marginal branches.  There is a 20% stenosis within the proximal      portion of the first obtuse marginal branch.  4. The right coronary artery is a medium-caliber vessel that is dominant.  There is a 20% mid vessel stenosis noted.  As stated above, there are      were fairly well-developed collaterals from the posterolateral system      to the distal left anterior descending that fills this vessel back up      towards the mid level.   Left ventriculography was performed in the RAO projection, reveals an  ejection fraction of approximately 60% with no significant mitral  regurgitation.   DIAGNOSES:  1. Severe single-vessel coronary artery disease with a high-grade mid left      anterior descending stenosis associated with fairly well-developed      right-to-left collateral flow.  Otherwise, mild coronary      atherosclerosis is noted.  2. Left ventricular ejection fraction of approximately 60% with no      significant mitral regurgitation and a left ventricular end-diastolic      pressure of 9 mmHg.   DISCUSSION:  I reviewed the results with the patient and his  wife.  I also  reviewed the films with Dr. Samule Ohm.  The tentative plan at this time is to  schedule [name] for a percutaneous intervention to address the left  anterior descending stenosis on Thursday.  In the meanwhile, he will be  loaded with Plavix and also begin on high-dose statin therapy pre procedure.  In discussing the situation with the patient and his wife I have also  determined that he was recently noted to have an elevated PSA level of  approximately 10 and is actually planning to see Dr. Vonita Moss tomorrow for  further urological evaluation.  I discussed the present situation with Dr.  Vonita Moss and my understanding is that [name] has a prior history of  mildly elevated PSA levels in the setting of prostatitis.  Assuming no other  major issues are uncovered, it is likely that the patient will be placed on  antibiotics at this point with plan for a follow-up PSA level over the next  several weeks.  This is particularly relavent to the present situation as it  may be ultimately that [name] requires a prostate biopsy perhaps over the  next six to eight weeks if his PSA level does not decrease.  Choice of stent  placement is therefore to be considered particularly as it relates to  duration of Plavix.  It might be most reasonable to place a non-drug-eluting  stent so that only a minimum of four weeks Plavix is required in case the  patient needs a prostate biopsy.  Dr. Vonita Moss felt like the bleeding risk  on Plavix in the setting of a prostate biopsy would be of significant  concern.  I will obviously defer the final decision regarding technical  aspects of the procedure and stent choice to Dr. Samule Ohm, although it seems  under the circumstances if reasonable, a non-drug-eluting stent might be the  best choice.  I discussed this with the patient and his wife and also  reviewed the situation with Dr. Antoine Poche.     Jonelle Sidle, MD  Electronically Signed      SGM/MEDQ  D:  02/28/2006  T:  02/28/2006  Job:  [id number]   cc:   Lonzo Cloud. Kriste Basque, MD

## 2010-12-03 NOTE — Assessment & Plan Note (Signed)
[facility]                                   ON-CALL NOTE   QUILLAN, WHITTER                        MRN:          [id number]  DATE:02/24/2006                            DOB:          1940/10/19    TELEPHONE CONVERSATION:  Telephone conversation with Gust Rung concerning  her husband, Markie Frith.   I received a telephone call on February 24, 2006 at 1952 through the answering  service for Ms. Ludolph.  She was calling concerning her husband.  I returned  a call back at [phone].  Ms. Rommel stated her husband had had a stress test  that day and that Dr. Antoine Poche had called and left a message that he needed  to speak with the patient concerning his stress test.  Ms. Shock states they  had just got home.  It was after 5 o'clock and she was concerned about the  results.  I assured her I would try to gather the information together and  call her back.  I spoke with Dr. Antoine Poche, who was at home.  He stated Mr.  Burley had an abnormal stress test and that he needed a cardiac  catheterization and that we would call the patient at home on Monday to  arrange this.  I called Ms. Lovering back, related this information to her and  stated that she would hear something from Dr. Jenene Slicker nurse on Monday.  If she had not heard anything by noon, she was to call our office and ask to  speak with his nurse, Victorino Dike, for further arrangement of cardiac  catheterization.  Ms. Nichols verbalized understanding of these instructions  and had no further questions at that time.                                   Dorian Pod, ACNP                                Rollene Rotunda, MD, [facility]   MB/MedQ  DD:  02/27/2006  DT:  02/28/2006  Job #:  [id number]

## 2010-12-03 NOTE — Assessment & Plan Note (Signed)
[facility]                              CARDIOLOGY OFFICE NOTE   TOWNES, FUHS                        MRN:          [id number]  DATE:02/23/2006                            DOB:          Sep 04, 1940    REASON FOR PRESENTATION:  Chest pain.   HISTORY OF PRESENT ILLNESS:  The patient is a very pleasant 70 year old  gentleman with no history of heart disease.  He reports chest discomfort  with exertion.  He said that this happened a few weeks ago.  He was walking  up an incline which was unusual and strenuous activity for him.  He said, he  got 4-5/10 chest discomfort during this.  It went away several minutes after  he stopped what he was doing.  He was slightly shortness of breath.  He had  no radiation to his neck or to  his arms.  He had no nausea, vomiting or  diaphoresis.  He had no palpitations, presyncope or syncope.  He had never  noticed this kind of pain before.  He said he had recurrence of this a few  days ago while lifting, with his wife, some furniture.  He has otherwise  been able to do his activities of daily living without bringing on these  symptoms but he has not been having any of these symptoms at rest.  He  denies any PND or orthopnea.   PAST MEDICAL HISTORY:  Hypertension times years.   PAST SURGICAL HISTORY:  Inguinal hernia repair, saliva gland removed,  mastectomy for benign tumor, hydrocele repair, foot surgery, eyelid surgery  for lid lag.   ALLERGIES:  None.   MEDICATIONS:  AcipHex 20 mg a day, atenolol 50 mg a day, aspirin 81 mg,  multivitamin.   SOCIAL HISTORY:  The patient smoked just very briefly most recently in the  last six months.  He quit a week ago.  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   FAMILY HISTORY:  Noncontributory for early coronary disease.   REVIEW OF SYSTEMS:  As stated in the HPI.  Negative for other systems.   PHYSICAL EXAMINATION:  GENERAL:  The patient  is in no distress.  VITAL SIGNS:  Blood pressure 142/76, heart rate 60 and regular.  HEENT:  Eyes are unremarkable.  Pupils are equal, round and reactive to  light.  Fundi within normal limits.  Oral mucosa is unremarkable.  NECK:  No jugular venous distention.  Waveform within normal limits.  Carotid Upstroke:  Brisk and symmetric.  No bruits, no thyromegaly.  LYMPHATICS:  No cervical, axillary, inguinal.  LUNGS:  Clear to auscultation bilaterally.  BACK:  No costovertebral angle tenderness.  CHEST:  Unremarkable.  HEART:  PMI not displaced or sustained.  S1, S2 within normal limits and no  S3.  No S4, no murmurs.  ABDOMEN:  Flat.  Positive bowel sounds.  Normal in frequency, and pitch.  No  bruits, rebound, guarding.  No midline pulsatile masses.  No hepatomegaly.  No splenomegaly.  SKIN:  No rashes.  EXTREMITIES:  2+ pulses throughout.  No edema, cyanosis or clubbing.  NEUROLOGIC:  Oriented to person, place and time.  Cranial nerves II thru XII  grossly intact.  Motor grossly intact.   EKG:  Sinus bradycardiac rate 47; axis within normal limits; intervals  within normal limits; sinus arrhythmia; no acute ST changes.   ASSESSMENT/PLAN:  1. Chest discomfort is very worrisome for new onset exertional angina.  We      discussed the possibilities of stress perfusion studies versus cardiac      catheterization.  I think that it would be reasonable to proceed with      stress perfusion study looking for a high grade area of ischemia.  He      and I discussed this at length and he agrees to proceed and will have      this done tomorrow morning.  2. Elevated PSA:  This was noted on recent labs and he will follow up Dr.      Kriste Basque about this.  3. Risk reduction:  He does have an elevated triglyceride of 217 and HDL      28.2 with a total cholesterol of 168.  We have discussed diet as an      initial step.  He should have this followed.  After initiating this and      can do exercise  regimen if he is cleared by stress study.  4. I will see the patient based on the results of the above stress period.      He certainly understands even with a negative stress, if he has      recurrent symptoms, I want to hear back from him.                               Rollene Rotunda, MD, [facility]    JH/MedQ  DD:  02/23/2006  DT:  02/23/2006  Job #:  [id number]   cc:   Lonzo Cloud. Kriste Basque, MD

## 2010-12-03 NOTE — Cardiovascular Report (Signed)
NAMEJARRET, [name] NO.:  [id number]   MEDICAL RECORD NO.:  [id number]          PATIENT TYPE:  INP   LOCATION:  6531                         FACILITY:  [facility]   PHYSICIAN:  Salvadore Farber, MD  DATE OF BIRTH:  1940-10-08   DATE OF PROCEDURE:  03/02/2006  DATE OF DISCHARGE:                              CARDIAC CATHETERIZATION   PROCEDURE:  1. Bare metal stent placement in the mid portion of the left anterior      descending artery.  2. StarClose closure of the right common femoral arteriotomy site.   INDICATIONS:  [name] is a 70 year old gentleman who presents with class  III angina.  Dr. Diona Browner performed diagnostic angiography on February 28, 2006.  That demonstrated at least 95% stenosis of the mid-LAD, with modest  collaterals from the right coronary artery.  He was referred for  percutaneous intervention today.   The patient has recently been noted to have a PSA elevated at approximately  10.  He saw Dr. Vonita Moss.  We have discussed management of his coronary  disease and elevated PSA with Dr. Vonita Moss.  Plan is to place a bare metal  stent in the mid-LAD and treat him  with Plavix for 30 days.  After that  will discontinue the Plavix.  At that point, Dr. Vonita Moss could consider  prostate biopsy, if indicated.   PROCEDURAL TECHNIQUE:  Informed consent was obtained.  Under 1% lidocaine  local anesthesia, a 6-French sheath was placed in the right common femoral  artery using the modified Seldinger technique.  Anticoagulation was  initiated with bivalirudin.  ACT was confirmed to be greater than 225  seconds.  Attempted to advance a Prowater wire across the lesion, but was  unable to do so due to the severity of the stenosis.  I therefore advanced a  Whisper wire across the lesion without difficulty.  I then predilated using  a 2.0 x 12 mm Voyager balloon at 6 atmospheres.  I exchanged for the  Prowater wire via the wire lumen of the balloon.  I then  stented the lesion  using a 2.25 x 16 mm Express stent, deployed at 14 atmospheres.  I then  postdilated the stent using a 2.5 x 15 mm Quantum at 16 atmospheres.  Final  angiography demonstrated no residual stenosis, no dissection, and TIMI III  flow to the distal vasculature.   The arteriotomy was then closed using a StarClose device.  Complete  hemostasis was obtained.  He was then transferred to the holding room in  stable condition, having tolerated the procedure well.   COMPLICATIONS:  None.   IMPRESSION/PLAN:  Successful bare metal stenting of the mid-LAD.  The  patient should be maintained on aspirin indefinitely and Plavix for 30 days.  After 30 days, if prostate biopsy is necessary and aspirin must be stopped  to facilitate this, this would be reasonable.      Salvadore Farber, MD  Electronically Signed     WED/MEDQ  D:  03/02/2006  T:  03/02/2006  Job:  [id number]   cc:  Lonzo Cloud. Kriste Basque, MD  Rollene Rotunda, MD, [facility]  Maretta Bees. Vonita Moss, M.D.

## 2010-12-03 NOTE — Op Note (Signed)
   NAME:  [name], [name]                           ACCOUNT NO.:  [id number]   MEDICAL RECORD NO.:  [id number]                   PATIENT TYPE:  AMB   LOCATION:  [facility]                                 FACILITY:  [facility]   PHYSICIAN:  Maretta Bees. Vonita Moss, M.D.             DATE OF BIRTH:  22-Feb-1941   DATE OF PROCEDURE:  07/08/2002  DATE OF DISCHARGE:                                 OPERATIVE REPORT   PREOPERATIVE DIAGNOSES:  Right hydrocele.   POSTOPERATIVE DIAGNOSES:  Right hydrocele.   PROCEDURE:  Right hydrocelectomy.   SURGEON:  Maretta Bees. Vonita Moss, M.D.   ANESTHESIA:  General.   INDICATIONS FOR PROCEDURE:  This 70 year old gentleman has had a long  history of a right scrotal mass and diagnosis of hydrocele and it is  becoming bigger and more uncomfortable for him and wishes surgical removal.  He was advised about the risks of hemorrhage, swelling, infection, etc.   DESCRIPTION OF PROCEDURE:  The patient was brought to the operating room,  placed in supine position, external genitalia were prepped and draped in the  usual fashion. A transverse incision was made in the right hemiscrotum and  the hydrocele dissected out. The hydrocele was adherent to surrounding  tissues and was reflective of some chronic low grade inflammation. The  hydrocele was opened up and drained of clear yellow fluid and the hydrocele  sac opened up and the testis and appendix epididymis were fulgurated. Excess  hydrocele sac was resected and the edges fulgurated. The residual edges of  the hydrocele were sutured posteriorly and everted so the hydrocele would  not recur. Hemostasis was obtained by the use of the electrocautery. At this  point, the testicle was placed back in the scrotum, the scrotal incision  closed in two layers of running 3-0 Chromic catgut. The wound was cleaned,  dressed with Neosporin dry sterile gauze dressing and a scrotal support.  Sponge, needle and instrument counts were correct. Blood  loss was  negligible. He tolerated the procedure well.                                               Maretta Bees. Vonita Moss, M.D.    LJP/MEDQ  D:  07/08/2002  T:  07/08/2002  Job:  [id number]   cc:   Lonzo Cloud. Kriste Basque, M.D. [facility]

## 2010-12-16 ENCOUNTER — Other Ambulatory Visit: Payer: Self-pay | Admitting: *Deleted

## 2010-12-16 MED ORDER — LISINOPRIL 10 MG PO TABS: 10.0000 mg | ORAL_TABLET | Freq: Every day | ORAL | Status: DC

## 2010-12-21 ENCOUNTER — Other Ambulatory Visit: Payer: Self-pay | Admitting: *Deleted

## 2010-12-21 MED ORDER — PRAVASTATIN SODIUM 20 MG PO TABS: 20.0000 mg | ORAL_TABLET | Freq: Every day | ORAL | Status: DC

## 2011-03-25 ENCOUNTER — Other Ambulatory Visit: Payer: Self-pay | Admitting: Pulmonary Disease

## 2011-03-25 MED ORDER — ATENOLOL 25 MG PO TABS: 25.0000 mg | ORAL_TABLET | Freq: Every day | ORAL | Status: DC

## 2011-03-25 NOTE — Telephone Encounter (Signed)
Will send refill for Atenolol 25 m,g for # 30, take once daily with no refills. Pt needs an appt. Last seen 03/19/2010 by SN.

## 2011-04-15 ENCOUNTER — Encounter: Payer: Self-pay | Admitting: Cardiology

## 2011-04-19 ENCOUNTER — Ambulatory Visit (INDEPENDENT_AMBULATORY_CARE_PROVIDER_SITE_OTHER): Payer: Medicare Other | Admitting: Cardiology

## 2011-04-19 ENCOUNTER — Encounter: Payer: Self-pay | Admitting: Cardiology

## 2011-04-19 DIAGNOSIS — I2510 Atherosclerotic heart disease of native coronary artery without angina pectoris: Secondary | ICD-10-CM

## 2011-04-19 DIAGNOSIS — I10 Essential (primary) hypertension: Secondary | ICD-10-CM

## 2011-04-19 DIAGNOSIS — E782 Mixed hyperlipidemia: Secondary | ICD-10-CM

## 2011-04-19 DIAGNOSIS — E785 Hyperlipidemia, unspecified: Secondary | ICD-10-CM

## 2011-04-19 MED ORDER — PRAVASTATIN SODIUM 20 MG PO TABS: 20.0000 mg | ORAL_TABLET | Freq: Every day | ORAL | Status: DC

## 2011-04-19 MED ORDER — LISINOPRIL 10 MG PO TABS: 10.0000 mg | ORAL_TABLET | Freq: Every day | ORAL | Status: DC

## 2011-04-19 MED ORDER — ATENOLOL 25 MG PO TABS: 25.0000 mg | ORAL_TABLET | Freq: Every day | ORAL | Status: DC

## 2011-04-19 NOTE — Progress Notes (Signed)
HPI The patient presents for follow up.  Since I last saw him he has done well.  The patient denies any new symptoms such as chest discomfort, neck or arm discomfort. There has been no new shortness of breath, PND or orthopnea. There have been no reported palpitations, presyncope or syncope.  He is not exercising routinely but he is busy in [place] usual activities.    Allergies  Allergen Reactions  . Simvastatin     REACTION: pt states INTOL to ZOCOR w/ leg pains    Current Outpatient Prescriptions  Medication Sig Dispense Refill  . atenolol (TENORMIN) 25 MG tablet Take 1 tablet (25 mg total) by mouth daily.  30 tablet  11  . lisinopril (PRINIVIL,ZESTRIL) 10 MG tablet Take 1 tablet (10 mg total) by mouth daily.  30 tablet  11  . pravastatin (PRAVACHOL) 20 MG tablet Take 1 tablet (20 mg total) by mouth daily.  30 tablet  11    Past Medical History  Diagnosis Date  . Recurrent aspiration bronchitis/pneumonia   . Pulmonary nodule   . CAD (coronary artery disease)     anterior ischemia on a stress perfusion study.  (Catheterization in 2007, demonstrating 95% mid-LAD stenosis.  The circumflex has 20% proximal stenosis, the right coronary artery had 20% mid stenosis.  The EF was 60%.  He had a non drug eluting stent placed.    Marland Kitchen Unspecified essential hypertension   . Other and unspecified hyperlipidemia   . Esophageal reflux   . Esophageal stricture   . Diverticulosis of colon (without mention of hemorrhage)   . Malignant neoplasm of prostate   . Osteoarthrosis, unspecified whether generalized or localized, unspecified site   . Osteoarthrosis, unspecified whether generalized or localized, unspecified site     Past Surgical History  Procedure Date  . Eyelid surgery for lid lag   . Foot surgery   . Hydrocele excision / repair   . Lung nodules   . Mastectomy     benign tumor  . Inguinal hernia repair   . Prostate treatment     cancer    ROS:  As stated in the HPI and negative for all  other systems.  PHYSICAL EXAM BP 114/80  Pulse 61  Resp 18  Ht 5\' 11"  (1.803 m)  Wt 194 lb 12.8 oz (88.361 kg)  BMI 27.17 kg/m2 GENERAL:  Well appearing HEENT:  Pupils equal round and reactive, fundi not visualized, oral mucosa unremarkable, dentures NECK:  No jugular venous distention, waveform within normal limits, carotid upstroke brisk and symmetric, no bruits, no thyromegaly LYMPHATICS:  No cervical, inguinal adenopathy LUNGS:  Clear to auscultation bilaterally BACK:  No CVA tenderness CHEST:  Unremarkable HEART:  PMI not displaced or sustained,S1 and S2 within normal limits, no S3, no S4, no clicks, no rubs, no murmurs ABD:  Flat, positive bowel sounds normal in frequency in pitch, no bruits, no rebound, no guarding, no midline pulsatile mass, no hepatomegaly, no splenomegaly EXT:  2 plus pulses throughout, no edema, no cyanosis no clubbing SKIN:  No rashes no nodules NEURO:  Cranial nerves II through XII grossly intact, motor grossly intact throughout PSYCH:  Cognitively intact, oriented to person place and time  EKG:  Sinus rhythm, rate, leftward axis, intervals within normal limits, no acute ST-T wave changes, early transition   ASSESSMENT AND PLAN

## 2011-04-19 NOTE — Assessment & Plan Note (Signed)
He reports that he is going to have labs done soon and I discussed the need to get fastin lipids.  The goal will be an LDL less than 100 and HDL greater than 40.  I would be happy to review these.

## 2011-04-19 NOTE — Assessment & Plan Note (Signed)
The blood pressure is at target. No change in medications is indicated. We will continue with therapeutic lifestyle changes (TLC).  

## 2011-04-19 NOTE — Patient Instructions (Signed)
The current medical regimen is effective;  continue present plan and medications.  Follow up in 1 year with Dr Hochrein.  You will receive a letter in the mail 2 months before you are due.  Please call us when you receive this letter to schedule your follow up appointment.  

## 2011-04-19 NOTE — Assessment & Plan Note (Signed)
The patient had a follow up stress test last year.  Since that time he has had no new sypmtoms.  No further cardiovascular testing is indicated.  We will continue with aggressive risk reduction and meds as listed.

## 2011-05-10 ENCOUNTER — Telehealth: Payer: Self-pay | Admitting: Pulmonary Disease

## 2011-05-10 DIAGNOSIS — E785 Hyperlipidemia, unspecified: Secondary | ICD-10-CM

## 2011-05-10 NOTE — Telephone Encounter (Signed)
I spoke with pt wife and she states Dr. Antoine Poche wants pt to have his cholesterol checked but advised pt that he needed to go to his pcp to have this done. Pt last OV was 03/19/10 and pending OV. Please advise Dr. Kriste Basque, thanks  Carver Fila, CMA

## 2011-05-10 NOTE — Telephone Encounter (Signed)
LMOMTCB labs have been placed in epic

## 2011-05-10 NOTE — Telephone Encounter (Signed)
Pt's wife returned call.  She was informed pt can come in for labs which have already been ordered.  She verbalized understanding of this and will inform pt.

## 2011-05-10 NOTE — Telephone Encounter (Signed)
Per SN---ok for lip and hepat.  thanks

## 2011-05-31 ENCOUNTER — Other Ambulatory Visit (INDEPENDENT_AMBULATORY_CARE_PROVIDER_SITE_OTHER): Payer: Medicare Other

## 2011-05-31 DIAGNOSIS — E785 Hyperlipidemia, unspecified: Secondary | ICD-10-CM

## 2011-05-31 LAB — LIPID PANEL
Cholesterol: 142 mg/dL (ref 0–200)
Total CHOL/HDL Ratio: 4
VLDL: 24.8 mg/dL (ref 0.0–40.0)

## 2011-05-31 LAB — HEPATIC FUNCTION PANEL
ALT: 30 U/L (ref 0–53)
Alkaline Phosphatase: 52 U/L (ref 39–117)
Bilirubin, Direct: 0 mg/dL (ref 0.0–0.3)

## 2011-08-04 ENCOUNTER — Telehealth: Payer: Self-pay | Admitting: Allergy

## 2011-08-04 DIAGNOSIS — I2510 Atherosclerotic heart disease of native coronary artery without angina pectoris: Secondary | ICD-10-CM

## 2011-08-04 MED ORDER — LISINOPRIL 10 MG PO TABS: 10.0000 mg | ORAL_TABLET | Freq: Every day | ORAL | Status: DC

## 2011-08-04 NOTE — Telephone Encounter (Signed)
Refill sent to the pharmacy for #30 with no refills.  Pt needs ov with SN for further refills.

## 2011-08-04 NOTE — Telephone Encounter (Signed)
[name] [address]  [address] rx for  Lisinopril 10 mg #30 Take  1 tablet qd  Last fill was 06/24/2011 Allergies  Allergen Reactions  . Simvastatin     REACTION: pt states INTOL to ZOCOR w/ leg pains   Dr Kriste Basque is this ok to fill

## 2011-09-07 ENCOUNTER — Other Ambulatory Visit: Payer: Self-pay | Admitting: Cardiology

## 2011-09-07 DIAGNOSIS — I2510 Atherosclerotic heart disease of native coronary artery without angina pectoris: Secondary | ICD-10-CM

## 2011-09-07 MED ORDER — LISINOPRIL 10 MG PO TABS: 10.0000 mg | ORAL_TABLET | Freq: Every day | ORAL | Status: DC

## 2011-09-07 NOTE — Telephone Encounter (Signed)
Refilled medication

## 2011-09-07 NOTE — Telephone Encounter (Signed)
Pt needs refill asap he is out

## 2012-03-20 ENCOUNTER — Other Ambulatory Visit: Payer: Self-pay | Admitting: Cardiology

## 2012-03-20 NOTE — Telephone Encounter (Signed)
..   Requested Prescriptions   Pending Prescriptions Disp Refills  . lisinopril (PRINIVIL,ZESTRIL) 10 MG tablet [Pharmacy Med Name: LISINOPRIL 10 MG TAB QUAL] 30 tablet 6    Sig: TAKE ONE TABLET BY MOUTH ONE TIME DAILY

## 2012-04-24 ENCOUNTER — Encounter: Payer: Self-pay | Admitting: Cardiology

## 2012-04-24 ENCOUNTER — Ambulatory Visit (INDEPENDENT_AMBULATORY_CARE_PROVIDER_SITE_OTHER): Payer: Medicare Other | Admitting: Cardiology

## 2012-04-24 VITALS — BP 145/89 | HR 62 | Ht 71.0 in | Wt 197.0 lb

## 2012-04-24 DIAGNOSIS — I10 Essential (primary) hypertension: Secondary | ICD-10-CM

## 2012-04-24 DIAGNOSIS — E7800 Pure hypercholesterolemia, unspecified: Secondary | ICD-10-CM

## 2012-04-24 DIAGNOSIS — I2510 Atherosclerotic heart disease of native coronary artery without angina pectoris: Secondary | ICD-10-CM

## 2012-04-24 NOTE — Progress Notes (Signed)
HPI The patient presents for follow up.  Since I last saw him he has done well.  The patient denies any new symptoms such as chest discomfort, neck or arm discomfort. There has been no new shortness of breath, PND or orthopnea. There have been no reported palpitations, presyncope or syncope.  He is not exercises at times but at other times he doesn't.  He does work full time.    Allergies  Allergen Reactions  . Simvastatin     REACTION: pt states INTOL to ZOCOR w/ leg pains    Current Outpatient Prescriptions  Medication Sig Dispense Refill  . atenolol (TENORMIN) 25 MG tablet Take 1 tablet (25 mg total) by mouth daily.  30 tablet  11  . lisinopril (PRINIVIL,ZESTRIL) 10 MG tablet TAKE ONE TABLET BY MOUTH ONE TIME DAILY  30 tablet  6  . pravastatin (PRAVACHOL) 20 MG tablet Take 1 tablet (20 mg total) by mouth daily.  30 tablet  11    Past Medical History  Diagnosis Date  . Recurrent aspiration bronchitis/pneumonia   . Pulmonary nodule   . CAD (coronary artery disease)     anterior ischemia on a stress perfusion study.  (Catheterization in 2007, demonstrating 95% mid-LAD stenosis.  The circumflex has 20% proximal stenosis, the right coronary artery had 20% mid stenosis.  The EF was 60%.  He had a non drug eluting stent placed.    Marland Kitchen Unspecified essential hypertension   . Other and unspecified hyperlipidemia   . Esophageal reflux   . Esophageal stricture   . Diverticulosis of colon (without mention of hemorrhage)   . Malignant neoplasm of prostate   . Osteoarthrosis, unspecified whether generalized or localized, unspecified site   . Osteoarthrosis, unspecified whether generalized or localized, unspecified site     Past Surgical History  Procedure Date  . Eyelid surgery for lid lag   . Foot surgery   . Hydrocele excision / repair   . Lung nodules   . Mastectomy     benign tumor  . Inguinal hernia repair   . Prostate treatment     cancer    ROS:  As stated in the HPI and  negative for all other systems.  PHYSICAL EXAM BP 145/89  Pulse 62  Ht 5\' 11"  (1.803 m)  Wt 89.359 kg (197 lb)  BMI 27.48 kg/m2 GENERAL:  Well appearing HEENT:  Pupils equal round and reactive, fundi not visualized, oral mucosa unremarkable, dentures NECK:  No jugular venous distention, waveform within normal limits, carotid upstroke brisk and symmetric, no bruits, no thyromegaly LYMPHATICS:  No cervical, inguinal adenopathy LUNGS:  Clear to auscultation bilaterally BACK:  No CVA tenderness CHEST:  Unremarkable HEART:  PMI not displaced or sustained,S1 and S2 within normal limits, no S3, no S4, no clicks, no rubs, no murmurs ABD:  Flat, positive bowel sounds normal in frequency in pitch, no bruits, no rebound, no guarding, no midline pulsatile mass, no hepatomegaly, no splenomegaly EXT:  2 plus pulses throughout, no edema, no cyanosis no clubbing SKIN:  No rashes no nodules NEURO:  Cranial nerves II through XII grossly intact, motor grossly intact throughout PSYCH:  Cognitively intact, oriented to person place and time  EKG:  NSR, rate 62, LAD.  No acute ST T wave changes.  04/24/2012   ASSESSMENT AND PLAN   CAD -  The patient had a follow up stress test in 2011. Since that time he has had no new sypmtoms. No further cardiovascular testing is  indicated. We will continue with aggressive risk reduction and meds as listed.   DYSLIPIDEMIA - He will come back for a lipid profile with an LDL goal less than 100 and HDL greater than 40.    HYPERTENSION - His blood pressure is mildly elevated today.  However, he reports that this is very unusual.  I reviewed the previous BPs this does seem to be an aberration.  No change in therapy is indicated.

## 2012-04-24 NOTE — Patient Instructions (Addendum)
The current medical regimen is effective;  continue present plan and medications.  Please return for a fasting lipid profile  Follow up in 1 year with Dr Antoine Poche.  You will receive a letter in the mail 2 months before you are due.  Please call us when you receive this letter to schedule your follow up appointment.

## 2012-04-29 ENCOUNTER — Other Ambulatory Visit: Payer: Self-pay | Admitting: Cardiology

## 2012-05-01 ENCOUNTER — Other Ambulatory Visit: Payer: Self-pay

## 2012-05-01 MED ORDER — PRAVASTATIN SODIUM 20 MG PO TABS: 20.0000 mg | ORAL_TABLET | Freq: Every day | ORAL | Status: DC

## 2012-05-03 ENCOUNTER — Other Ambulatory Visit (INDEPENDENT_AMBULATORY_CARE_PROVIDER_SITE_OTHER): Payer: Medicare Other

## 2012-05-03 DIAGNOSIS — E7800 Pure hypercholesterolemia, unspecified: Secondary | ICD-10-CM

## 2012-05-03 LAB — LDL CHOLESTEROL, DIRECT: Direct LDL: 101 mg/dL

## 2012-05-03 LAB — LIPID PANEL: Total CHOL/HDL Ratio: 5

## 2012-05-07 ENCOUNTER — Telehealth: Payer: Self-pay

## 2012-05-07 NOTE — Telephone Encounter (Signed)
Message copied by Yolonda Kida on Mon May 07, 2012  4:38 PM ------      Message from: Rollene Rotunda      Created: Sun May 06, 2012  9:10 PM       LDL is near target.  Triglycerides are elevated.  No change in therapy.  Dietary changes.  Call Mr. Sgro with the results.

## 2012-05-07 NOTE — Telephone Encounter (Signed)
Patient aware of lab results.

## 2012-05-10 ENCOUNTER — Encounter: Payer: Self-pay | Admitting: *Deleted

## 2012-05-15 ENCOUNTER — Other Ambulatory Visit: Payer: Self-pay | Admitting: Cardiology

## 2012-05-15 NOTE — Telephone Encounter (Signed)
..   Requested Prescriptions   Pending Prescriptions Disp Refills  . atenolol (TENORMIN) 25 MG tablet [Pharmacy Med Name: ATENOLOL 25 MG TAB RANB] 30 tablet 10    Sig: Take 1 tablet (25 mg total) by mouth daily.

## 2012-06-09 ENCOUNTER — Other Ambulatory Visit: Payer: Self-pay | Admitting: Cardiology

## 2012-07-18 ENCOUNTER — Other Ambulatory Visit: Payer: Self-pay | Admitting: Cardiology

## 2012-07-19 ENCOUNTER — Other Ambulatory Visit: Payer: Self-pay | Admitting: *Deleted

## 2012-12-06 ENCOUNTER — Telehealth: Payer: Self-pay | Admitting: Pulmonary Disease

## 2012-12-06 NOTE — Telephone Encounter (Signed)
Can add on June 20 at 28.  thanks

## 2012-12-06 NOTE — Telephone Encounter (Signed)
appt set, pt spouse is aware.[name], CMA

## 2012-12-06 NOTE — Telephone Encounter (Signed)
lmtcb x1 for pt w/ co-worker

## 2012-12-06 NOTE — Telephone Encounter (Signed)
I spoke with spouse and she is requesting an APPT with SN soon for [facility]. She stated pt is having several problems and prefer to have all this done at [facility]. Next available is not until July and she does not want to wait that long for pt to be seen. Please advise SN thanks  Last oV 03/2010

## 2012-12-17 ENCOUNTER — Other Ambulatory Visit: Payer: Self-pay | Admitting: Cardiology

## 2012-12-17 ENCOUNTER — Telehealth: Payer: Self-pay | Admitting: Pulmonary Disease

## 2012-12-17 DIAGNOSIS — F411 Generalized anxiety disorder: Secondary | ICD-10-CM

## 2012-12-17 DIAGNOSIS — K5730 Diverticulosis of large intestine without perforation or abscess without bleeding: Secondary | ICD-10-CM

## 2012-12-17 DIAGNOSIS — C61 Malignant neoplasm of prostate: Secondary | ICD-10-CM

## 2012-12-17 DIAGNOSIS — I10 Essential (primary) hypertension: Secondary | ICD-10-CM

## 2012-12-17 DIAGNOSIS — E785 Hyperlipidemia, unspecified: Secondary | ICD-10-CM

## 2012-12-17 NOTE — Telephone Encounter (Signed)
..   Requested Prescriptions   Pending Prescriptions Disp Refills  . lisinopril (PRINIVIL,ZESTRIL) 10 MG tablet [Pharmacy Med Name: LISINOPRIL 10MG  TABLETS] 30 tablet 6    Sig: TAKE ONE TABLET BY MOUTH ONE TIME DAILY

## 2012-12-17 NOTE — Telephone Encounter (Signed)
[facility] is scheduled for 01/04/13.please advise what labs pt will need thanks SN

## 2012-12-17 NOTE — Telephone Encounter (Signed)
Per SN---   Lip, bmp,hepat, cbcd, tsh, psa   Lab orders have been placed in the computer and pts wife is aware. Nothing further is needed.

## 2012-12-26 ENCOUNTER — Other Ambulatory Visit (INDEPENDENT_AMBULATORY_CARE_PROVIDER_SITE_OTHER): Payer: Medicare Other

## 2012-12-26 DIAGNOSIS — I10 Essential (primary) hypertension: Secondary | ICD-10-CM

## 2012-12-26 DIAGNOSIS — F411 Generalized anxiety disorder: Secondary | ICD-10-CM

## 2012-12-26 DIAGNOSIS — C61 Malignant neoplasm of prostate: Secondary | ICD-10-CM

## 2012-12-26 DIAGNOSIS — K5730 Diverticulosis of large intestine without perforation or abscess without bleeding: Secondary | ICD-10-CM

## 2012-12-26 DIAGNOSIS — E785 Hyperlipidemia, unspecified: Secondary | ICD-10-CM

## 2012-12-26 LAB — HEPATIC FUNCTION PANEL
AST: 21 U/L (ref 0–37)
Albumin: 3.8 g/dL (ref 3.5–5.2)
Alkaline Phosphatase: 49 U/L (ref 39–117)
Bilirubin, Direct: 0.1 mg/dL (ref 0.0–0.3)

## 2012-12-26 LAB — CBC WITH DIFFERENTIAL/PLATELET
Basophils Absolute: 0 10*3/uL (ref 0.0–0.1)
Eosinophils Absolute: 0.2 10*3/uL (ref 0.0–0.7)
HCT: 41.9 % (ref 39.0–52.0)
Hemoglobin: 14.4 g/dL (ref 13.0–17.0)
Lymphs Abs: 2.7 10*3/uL (ref 0.7–4.0)
MCHC: 34.2 g/dL (ref 30.0–36.0)
MCV: 95 fl (ref 78.0–100.0)
Monocytes Absolute: 0.5 10*3/uL (ref 0.1–1.0)
Monocytes Relative: 9.1 % (ref 3.0–12.0)
Neutro Abs: 2.2 10*3/uL (ref 1.4–7.7)
Platelets: 185 10*3/uL (ref 150.0–400.0)
RDW: 13.8 % (ref 11.5–14.6)

## 2012-12-26 LAB — TSH: TSH: 1.28 u[IU]/mL (ref 0.35–5.50)

## 2012-12-26 LAB — BASIC METABOLIC PANEL
GFR: 78.22 mL/min (ref >60.00)
Glucose, Bld: 105 mg/dL — ABNORMAL HIGH (ref 70–99)
Potassium: 4.7 mEq/L (ref 3.5–5.1)
Sodium: 141 mEq/L (ref 135–145)

## 2012-12-26 LAB — LIPID PANEL
HDL: 30.7 mg/dL — ABNORMAL LOW (ref >39.00)
Total CHOL/HDL Ratio: 6

## 2013-01-04 ENCOUNTER — Encounter: Payer: Medicare Other | Admitting: Pulmonary Disease

## 2013-02-05 ENCOUNTER — Encounter: Payer: Self-pay | Admitting: Pulmonary Disease

## 2013-02-05 ENCOUNTER — Ambulatory Visit (INDEPENDENT_AMBULATORY_CARE_PROVIDER_SITE_OTHER): Payer: Medicare Other | Admitting: Pulmonary Disease

## 2013-02-05 VITALS — BP 142/78 | HR 62 | Temp 97.7°F | Ht 70.5 in | Wt 199.2 lb

## 2013-02-05 DIAGNOSIS — E785 Hyperlipidemia, unspecified: Secondary | ICD-10-CM

## 2013-02-05 DIAGNOSIS — K219 Gastro-esophageal reflux disease without esophagitis: Secondary | ICD-10-CM

## 2013-02-05 DIAGNOSIS — I10 Essential (primary) hypertension: Secondary | ICD-10-CM

## 2013-02-05 DIAGNOSIS — K5730 Diverticulosis of large intestine without perforation or abscess without bleeding: Secondary | ICD-10-CM

## 2013-02-05 DIAGNOSIS — F411 Generalized anxiety disorder: Secondary | ICD-10-CM

## 2013-02-05 DIAGNOSIS — I2510 Atherosclerotic heart disease of native coronary artery without angina pectoris: Secondary | ICD-10-CM

## 2013-02-05 DIAGNOSIS — M1990 Unspecified osteoarthritis, unspecified site: Secondary | ICD-10-CM

## 2013-02-05 DIAGNOSIS — C61 Malignant neoplasm of prostate: Secondary | ICD-10-CM

## 2013-02-05 MED ORDER — FENOFIBRATE 160 MG PO TABS: 160.0000 mg | ORAL_TABLET | Freq: Every day | ORAL | Status: DC

## 2013-02-05 MED ORDER — ALPRAZOLAM 0.5 MG PO TABS: ORAL_TABLET | ORAL | Status: DC

## 2013-02-05 MED ORDER — OMEPRAZOLE 40 MG PO CPDR: 40.0000 mg | DELAYED_RELEASE_CAPSULE | Freq: Every day | ORAL | Status: DC

## 2013-02-05 NOTE — Patient Instructions (Addendum)
[name], it was good seeing you again...  Today we reviewed your recent blood work & gave you a copy of the report>>  For your LIPIDS>>    cholesterolis ok on the Pravastatin20- continue same...    Triglycerides are too high on your low fat diet aline 7 the HDL (good chol) is too low> so we will add in FENOFIBRATE 160mg  one tab daily...  For your BP>>    Your BP is borderline here today & you should start to check your BP at home again...    If you are 130s/80s & better then continue the same meds (Atenolol & Lisinopril)...    But if the BP is staying higher than that- you should double both meds & let us know...  For your Prostate>>    The PSA is higher than it has been since your treatment for prostate cancer-    Keep the appt w/ drGrapey to discuss additional work up 7 treatment...  Finally, we added ALPRAZOLAM 0.5mg  - 1/2 to 1 tab up to 3 times daily as needed for nerves...  Call for any questions...  Let's plan a follow up visit in 39mo, sooner if needed for problems.Marland KitchenMarland Kitchen

## 2013-02-28 NOTE — Progress Notes (Signed)
Subjective:     Patient ID: [name], male   DOB: Sep 03, 1940, 72 y.o.   MRN: [id number]  HPI 72 y/o WM here for a follow up visit... he has multiple medical problems as noted below...    ~  August 19, 2009:  routine f/u doing well- business was slow & he did a job in [place] but it didn't work out, now picking back up... he is active- denies CP, palpit, SOB, swelling, etc... he fell on the ice w/ some incr in right shoulder discomfort- he will call DrDaldorf as needed... he had labs 5/10 from DrHochrein & doesn't want full labs today, due for Flu shot...  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 he denies CP, palpit, change in SOB (he has chr stable DOE), cough, phlegm, edema, etc... BP controlled on meds;  no angina;  he saw GI 5/11 w/ dysphagia- EGD 5/11 by DrPatterson showed recurrent stricture, dilated #98F maloney, 3cmHH, & changed to ACIPHEX 20mg /d... we discussed checking FASTING labs today, and adding Alprazolam Prn fr all the stress he is under.  ~  February 05, 2013:  72yr ROV>  [name] returns after a long hiatus- being cared for by [employer] for Cards, & DrGrapey for Urology;  He has numerous medical issues- addressed below, but returns asking for a nerve pill describing lots of stress w/ his business etc; we discussed trial Alpraz0.5mg  tid as needed...     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    HBP> on Aten25, Lisin10;  BP= 142/78 & wt=199# (same as 2011); he denies CP, palpit, SOB, dizzy, edema, etc...    CAD> rec to take ASA81; followed by DrHochrein & last seen 10/13- stable, no changes made...    Dyslipidemia> on Prav20;  FLP 6/14 showed TChol 169, TG 242, HDL 31, LDL 93 & we reviewed diet, exercise, & rec adding  FENOFIBRATE 160mg /d...     GI- GERD, Divertics> on Omep40;  He denies abd pain, dysphagia, n/v, c/d, blood seen; last colon by DrPatterson was 2004 & he is due for f/u procedure...     Hx Prostate Cancer> followed by DrGrapey but pt missed his last appt & is due now- Hx prostate ca 2007 w/ IMRT & hormone therapy x64yrs; slowly rising PSA per Urology but Lab 6/14 showed PSA= 3.21    DJD> on OTC analgesics as needed; he has seen DrDaldorf in the past for left knee pain...    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 We reviewed prob list, meds, xrays and labs> see below for updates >>  LABS 6/14:  FLP- chol ok but TG=242 HDL=31;  Chems- wnl;  CBC- wnl;  TSH=1.28;  PSA=3.21 (prev in 2011 it was 0.57)...          Problem List:    BRONCHITIS, RECURRENT (ICD-491.9) - no recent URI symptoms... Hx of PULMONARY NODULE (ICD-518.89) - mult sm benign pulm nodules (?granulomas) on CTChest- last 12/07 without adenopathy, no change, +coronary calcif seen...  ~  f/u CXR 8/08 without nodules seen... ~  f/u CXR 11/09 clear, no lesions seen... ~  f/u CXR 2/11 showed  clear, NAD...  HYPERTENSION (ICD-401.9) - contolled on ATENOLOL 25mg /d, & LISINOPRIL 10mg /d...  ~  9/11:  BP=110/62 here and usually 120s/ 70s at home- denies HA, fatigue, visual changes, CP, palipit, dizziness, syncope, dyspnea, edema, etc... ~  7/14:  on Aten25, Lisin10;  BP= 142/78 & wt=199# (same as 2011); he denies CP, palpit, SOB, dizzy, edema, etc.  CAD (ICD-414.00) - followed by DrHochrein... on ASA 325mg /d. ~  NuclearStressTest 8/07 abnormal w/ ant ischemia... ~  cath 8/07 showed 95% mid-LAD stenosis, & 20% lesions in the other 2 vessels, EF=60%... subseq PTCA w/ non-drug eluting stent... ~  OV w/ Cards= 5/10 & note reviewed- BP sl elevated & Lisinopril added. ~  He saw DrHochrein 10/13> doing well, no new symptoms, exam was neg, no change in meds &  rec aggressive risk factor reduction strategy...  ~  EKG 10/13 showed NSR, rate62, LAD, otherw wnl, NAD...  DYSLIPIDEMIA (ICD-272.4) - on PRAVACHOL 20mg /d and tol OK...  ~  FLP 8/08 showed TChol 168, TG 143, HDL34, LDL105 ~  FLP 2/09 showed TChol 163, TG 207, HDL 34, LDL 91 ~  FLP 5/10 showed TChol 148, TG 131, HDL 33, LDL 89 ~  FLP 9/11 showed TChol 163, TG 140, HDL 42, LDL 93 ~  FLP 6/14 on Prav20 showed TChol 169, TG 242, HDL 31, LDL 93   GERD (ICD-530.81) - prev on OMEP20 but causes diarrhea he says, and ACIPHEX 20mg /d works better...  ~  last EGD (DrPatterson)was 8/04 showing GERD & stricture dilated... ~  5/11: presented to GI w/ recurrent dysphagia- EGD showed 3cmHH, stricture dilated, no Barrett's, changed to Aciphex.  DIVERTICULOSIS OF COLON (ICD-562.10) >>  ~  last colonoscopy 8/04 by DrPatterson was normal, f/u 7yrs (scattered tics seen on 1996 flex) ~  7/14:  He is due for f/u colon 7 we will refer the chart to DrPatterson for review...  PROSTATE CANCER (ICD-185) - followed by DrPeterson- elevated PSA found 8/07 (10.6)... biopsies were pos (Stage T2b, bilat dis w/ Gleason4+3=7 on one side 7 8 on the other) & there is a family hx as well... after consultation at [facility] they rec IMRT, then hormonal Rx for 2 yrs... XRT completed by [facility] 4/08... prev on TRELSTAR injections q 3months & PROVERA for the hot flashes>> off both now... neg bone scan 1/08... last PSA here= 0.03 in 2008... ~  2/11: we don't have recent note from DrPeterson & pt will request info sent to [place] at next visit. ~  9/11: labs here showed PSA= 0.57 & we will forward to DrPeterson ~  9/13:  He had f/u visit w/ DrGrapey> PSA had been slowly rising and ~1 when they last checked; DrGrapey rec Q31mo f/u  ~  7/14:  Pt missed his 3/14 appt w/ Urology & is sched to see them 8/14; PSA recorded 6/14 = 3.21 & we sent copy to DrGrapey...  DEGENERATIVE JOINT DISEASE (ICD-715.90) - his left knee gives him some pain on  and off... he saw DrDaldorf for this 12/08 & he thought there might be a torn meniscus, they decided to Rx w/ NAPROSEN which helps... may yet need MRI/ arthroscopy... ~  2/11: discussed trial of Mobic to see if this works for him.  HEALTH MAINTENANCE >>  ~  GI>  Followed by [facility] 7 last colon was 2004- f/u due now... ~  GU>  Followed by DrGrapey 7 seen every 42mo for PSA recheck but he missed the 3/14 appt & our PSA 6/14= 3.21 (  copy to Urology & he has appt 8/14)... ~  Immuniz>  He gets the yearly flu vax (last 10/13);  Had Pneumovax several yrs ago;  Not sure of last Tetanus vaccine;  Asking about the shingles shot...   Past Surgical History  Procedure Laterality Date  . Eyelid surgery for lid lag    . Foot surgery    . Hydrocele excision / repair    . Lung nodules    . Mastectomy      benign tumor  . Inguinal hernia repair    . Prostate treatment      cancer  . Skin cancer excision  2012    skin cancer on right ear    Outpatient Encounter Prescriptions as of 02/05/2013  Medication Sig Dispense Refill  . atenolol (TENORMIN) 25 MG tablet Take 1 tablet (25 mg total) by mouth daily.  30 tablet  10  . lisinopril (PRINIVIL,ZESTRIL) 10 MG tablet TAKE ONE TABLET BY MOUTH ONE TIME DAILY  30 tablet  6  . omeprazole (PRILOSEC) 40 MG capsule Take 1 capsule (40 mg total) by mouth daily.  30 capsule  11  . pravastatin (PRAVACHOL) 20 MG tablet Take 1 tablet (20 mg total) by mouth daily.  30 tablet  11  . [DISCONTINUED] omeprazole (PRILOSEC) 40 MG capsule Take 40 mg by mouth daily.      Marland Kitchen ALPRAZolam (XANAX) 0.5 MG tablet Take 1/2 to 1 tablet by mouth three times daily as needed for anxiety  90 tablet  5  . fenofibrate 160 MG tablet Take 1 tablet (160 mg total) by mouth daily.  30 tablet  11  . [DISCONTINUED] pravastatin (PRAVACHOL) 20 MG tablet TAKE ONE TABLET BY MOUTH ONE TIME DAILY  30 tablet  5   No facility-administered encounter medications on file as of 02/05/2013.    Allergies   Allergen Reactions  . Simvastatin     REACTION: pt states INTOL to ZOCOR w/ leg pains    Current Medications, Allergies, Past Medical History, Past Surgical History, Family History, and Social History were reviewed in [employer] record.   Review of Systems         See HPI - The patient denies anorexia, fever, weight loss, weight gain, vision loss, decreased hearing, hoarseness, chest pain, syncope, dyspnea on exertion, peripheral edema, prolonged cough, headaches, hemoptysis, abdominal pain, melena, hematochezia, severe indigestion/heartburn, hematuria, incontinence, muscle weakness, suspicious skin lesions, transient blindness, difficulty walking, depression, unusual weight change, abnormal bleeding, enlarged lymph nodes, and angioedema.     Objective:   Physical Exam    WD, WN, 71 y/o WM in NAD... GENERAL:  Alert & oriented; pleasant & cooperative... HEENT:  [place]/AT, EOM-wnl, PERRLA, EACs-clear, TMs-wnl, NOSE-clear, THROAT-clear & wnl. NECK:  Supple w/ fairROM; no JVD; normal carotid impulses w/o bruits; no thyromegaly or nodules palpated; no lymphadenopathy. CHEST:  Clear to P & A; without wheezes/ rales/ or rhonchi heard... HEART:  Regular Rhythm; without murmurs/ rubs/ or gallops detected... ABDOMEN:  Soft & nontender; normal bowel sounds; no organomegaly or masses palpated... EXT: without deformities, mild arthritic changes; no varicose veins/ venous insuffic/ or edema. NEURO:  CN's intact; motor testing normal; sensory testing normal; gait normal & balance OK. DERM:  No lesions noted; no rash etc...  RADIOLOGY DATA:  Reviewed in the EPIC EMR & discussed w/ the patient...  LABORATORY DATA:  Reviewed in the EPIC EMR & discussed w/ the patient...   Assessment:     Hx Bronchitis and Pulm Nodule>  He did not do f/u CXR today; mult sm granulomas seen on CTChest 2007 & CXRs have been clear...  HBP>  Stable on low dose Aten 7 Lisin but I mentioned we could  easily incr one or both if his home BP monitor indicates that his pressure is up...  CAD>  Followed by DrHochrein; seen last 10/13 7 stable, no changes made; needs to incr his exercise program...  Dyslipidemia>  On Prav20 but TG & HDL deranged; rec to add FENOFIBRATE 160mg /d + diet/ exercise/ etc...  GI- GERD, Diverics, needs f/u colon>  We will refer to DrPatterson for this latter...  Prostate Ca>  Managed by DrGrapey but pt missed their last visit & PSA is up to 3.21, copy to Urology & he has f/u appt 8/124...   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  Anxiety>  He is requesting Rx- try ALPRAZOLAM 0.5mg  tid as needed...      Plan:     Patient's Medications  New Prescriptions   ALPRAZOLAM (XANAX) 0.5 MG TABLET    Take 1/2 to 1 tablet by mouth three times daily as needed for anxiety   FENOFIBRATE 160 MG TABLET    Take 1 tablet (160 mg total) by mouth daily.  Previous Medications   ATENOLOL (TENORMIN) 25 MG TABLET    Take 1 tablet (25 mg total) by mouth daily.   LISINOPRIL (PRINIVIL,ZESTRIL) 10 MG TABLET    TAKE ONE TABLET BY MOUTH ONE TIME DAILY   PRAVASTATIN (PRAVACHOL) 20 MG TABLET    Take 1 tablet (20 mg total) by mouth daily.  Modified Medications   Modified Medication Previous Medication   OMEPRAZOLE (PRILOSEC) 40 MG CAPSULE omeprazole (PRILOSEC) 40 MG capsule      Take 1 capsule (40 mg total) by mouth daily.    Take 40 mg by mouth daily.  Discontinued Medications   PRAVASTATIN (PRAVACHOL) 20 MG TABLET    TAKE ONE TABLET BY MOUTH ONE TIME DAILY

## 2013-03-04 ENCOUNTER — Other Ambulatory Visit: Payer: Self-pay | Admitting: Pulmonary Disease

## 2013-03-05 ENCOUNTER — Ambulatory Visit (AMBULATORY_SURGERY_CENTER): Payer: Medicare Other | Admitting: *Deleted

## 2013-03-05 ENCOUNTER — Encounter: Payer: Self-pay | Admitting: Gastroenterology

## 2013-03-05 ENCOUNTER — Other Ambulatory Visit: Payer: Self-pay

## 2013-03-05 VITALS — Ht 71.0 in | Wt 201.4 lb

## 2013-03-05 DIAGNOSIS — Z1211 Encounter for screening for malignant neoplasm of colon: Secondary | ICD-10-CM

## 2013-03-05 MED ORDER — MOVIPREP 100 G PO SOLR: ORAL | Status: DC

## 2013-03-05 MED ORDER — PRAVASTATIN SODIUM 20 MG PO TABS: 20.0000 mg | ORAL_TABLET | Freq: Every day | ORAL | Status: DC

## 2013-03-05 NOTE — Progress Notes (Signed)
No allergies to eggs or soy. No problems with anesthesia.  

## 2013-03-13 ENCOUNTER — Encounter: Payer: Self-pay | Admitting: Gastroenterology

## 2013-03-13 ENCOUNTER — Encounter: Payer: Medicare Other | Admitting: Gastroenterology

## 2013-03-13 ENCOUNTER — Ambulatory Visit (AMBULATORY_SURGERY_CENTER): Payer: BLUE CROSS/BLUE SHIELD | Admitting: Gastroenterology

## 2013-03-13 VITALS — BP 112/69 | HR 51 | Temp 97.2°F | Resp 14 | Ht 71.0 in | Wt 201.0 lb

## 2013-03-13 DIAGNOSIS — K5730 Diverticulosis of large intestine without perforation or abscess without bleeding: Secondary | ICD-10-CM

## 2013-03-13 DIAGNOSIS — Z1211 Encounter for screening for malignant neoplasm of colon: Secondary | ICD-10-CM

## 2013-03-13 HISTORY — PX: COLONOSCOPY: SHX174

## 2013-03-13 MED ORDER — SODIUM CHLORIDE 0.9 % IV SOLN: 500.0000 mL | INTRAVENOUS | Status: DC

## 2013-03-13 NOTE — Progress Notes (Signed)
Patient did not experience any of the following events: a burn prior to discharge; a fall within the facility; wrong site/side/patient/procedure/implant event; or a hospital transfer or hospital admission upon discharge from the facility. (G8907) Patient did not have preoperative order for IV antibiotic SSI prophylaxis. (G8918)  

## 2013-03-13 NOTE — Op Note (Signed)
[facility] [address] [place], [address]   COLONOSCOPY PROCEDURE REPORT  PATIENT: [name], [name]  MR#: [id number] BIRTHDATE: [birth year] , 72  yrs. old GENDER: Male ENDOSCOPIST: Mardella Layman, MD, [facility] REFERRED BY: PROCEDURE DATE:  03/13/2013 PROCEDURE:   Colonoscopy, screening First Screening Colonoscopy - Avg.  risk and is 50 yrs.  old or older - No. ASA CLASS:   Class III INDICATIONS:average risk screening. MEDICATIONS: propofol (Diprivan) 250mg  IV  DESCRIPTION OF PROCEDURE:   After the risks benefits and alternatives of the procedure were thoroughly explained, informed consent was obtained.  A digital rectal exam revealed no abnormalities of the rectum.   The LB [id number]  endoscope was introduced through the anus and advanced to the cecum, which was identified by both the appendix and ileocecal valve. No adverse events experienced.   The quality of the prep was excellent, using MoviPrep  The instrument was then slowly withdrawn as the colon was fully examined.      COLON FINDINGS: There was moderate diverticulosis noted in the descending colon and sigmoid colon with associated muscular hypertrophy and petechiae.   The colon was otherwise normal.  There was no diverticulosis, inflammation, polyps or cancers unless previously stated.  Retroflexion was not performed. The time to cecum=1 minutes 55 seconds.  Withdrawal time=6 minutes 05 seconds. The scope was withdrawn and the procedure completed. COMPLICATIONS: There were no complications.  ENDOSCOPIC IMPRESSION: 1.   There was moderate diverticulosis noted in the descending colon and sigmoid colon 2.   The colon was otherwise normal ..no polyps noted,,,  RECOMMENDATIONS: 1.  Continue current medications 2.  High fiber diet 3.  You should continue to follow colorectal cancer screening guidelines for "routine risk" patients with a repeat colonoscopy in 10 years.  There is no need  for FOBT (stool) testing for at least 5 years.   eSigned:  Mardella Layman, MD, [facility] 03/13/2013 9:35 AM   cc: Michele Mcalpine, MD

## 2013-03-13 NOTE — Patient Instructions (Addendum)

## 2013-03-14 ENCOUNTER — Telehealth: Payer: Self-pay | Admitting: *Deleted

## 2013-03-14 NOTE — Telephone Encounter (Signed)
  Follow up Call-  Call back number 03/13/2013  Post procedure Call Back phone  # [phone]  Permission to leave phone message Yes     Patient questions:  Do you have a fever, pain , or abdominal swelling? no Pain Score  0 *  Have you tolerated food without any problems? yes  Have you been able to return to your normal activities? yes  Do you have any questions about your discharge instructions: Diet   no Medications  no Follow up visit  no  Do you have questions or concerns about your Care? no  Actions: * If pain score is 4 or above: No action needed, pain <4.

## 2013-04-26 ENCOUNTER — Encounter: Payer: Self-pay | Admitting: Cardiology

## 2013-04-26 ENCOUNTER — Ambulatory Visit (INDEPENDENT_AMBULATORY_CARE_PROVIDER_SITE_OTHER): Payer: Medicare Other | Admitting: Cardiology

## 2013-04-26 VITALS — BP 156/88 | HR 57 | Wt 199.0 lb

## 2013-04-26 DIAGNOSIS — I25810 Atherosclerosis of coronary artery bypass graft(s) without angina pectoris: Secondary | ICD-10-CM

## 2013-04-26 DIAGNOSIS — E7800 Pure hypercholesterolemia, unspecified: Secondary | ICD-10-CM

## 2013-04-26 DIAGNOSIS — I10 Essential (primary) hypertension: Secondary | ICD-10-CM

## 2013-04-26 NOTE — Progress Notes (Signed)
HPI The patient presents for follow up.  Since I last saw him he has done well.  The patient denies any new symptoms such as chest discomfort, neck or arm discomfort. There has been no new shortness of breath, PND or orthopnea. There have been no reported palpitations, presyncope or syncope.  He has been under increased stress at work. He is physically active and has had no symptoms related to this.  Allergies  Allergen Reactions  . Simvastatin     REACTION: pt states INTOL to ZOCOR w/ leg pains    Current Outpatient Prescriptions  Medication Sig Dispense Refill  . ALPRAZolam (XANAX) 0.5 MG tablet Take 1/2 to 1 tablet by mouth three times daily as needed for anxiety  90 tablet  5  . aspirin 81 MG tablet Take 81 mg by mouth daily.      Marland Kitchen atenolol (TENORMIN) 25 MG tablet Take 1 tablet (25 mg total) by mouth daily.  30 tablet  10  . lisinopril (PRINIVIL,ZESTRIL) 10 MG tablet TAKE ONE TABLET BY MOUTH ONE TIME DAILY  30 tablet  6  . omeprazole (PRILOSEC) 40 MG capsule Take 1 capsule (40 mg total) by mouth daily.  30 capsule  11  . pravastatin (PRAVACHOL) 20 MG tablet Take 1 tablet (20 mg total) by mouth daily.  30 tablet  2   No current facility-administered medications for this visit.    Past Medical History  Diagnosis Date  . Recurrent aspiration bronchitis/pneumonia   . Pulmonary nodule   . CAD (coronary artery disease)     anterior ischemia on a stress perfusion study.  (Catheterization in 2007, demonstrating 95% mid-LAD stenosis.  The circumflex has 20% proximal stenosis, the right coronary artery had 20% mid stenosis.  The EF was 60%.  He had a non drug eluting stent placed.    Marland Kitchen Unspecified essential hypertension   . Other and unspecified hyperlipidemia   . Esophageal reflux   . Esophageal stricture   . Diverticulosis of colon (without mention of hemorrhage)   . Malignant neoplasm of prostate   . Osteoarthrosis, unspecified whether generalized or localized, unspecified site     . Osteoarthrosis, unspecified whether generalized or localized, unspecified site     Past Surgical History  Procedure Laterality Date  . Eyelid surgery for lid lag    . Foot surgery    . Hydrocele excision / repair    . Lung nodules    . Mastectomy      benign tumor  . Inguinal hernia repair    . Prostate treatment      cancer  . Skin cancer excision  2012    skin cancer on right ear    ROS:  As stated in the HPI and negative for all other systems.  PHYSICAL EXAM BP 156/88  Pulse 57  Wt 199 lb (90.266 kg)  BMI 27.77 kg/m2 GENERAL:  Well appearing HEENT:  Pupils equal round and reactive, fundi not visualized, oral mucosa unremarkable, dentures NECK:  No jugular venous distention, waveform within normal limits, carotid upstroke brisk and symmetric, no bruits, no thyromegaly LUNGS:  Clear to auscultation bilaterally BACK:  No CVA tenderness CHEST:  Unremarkable HEART:  PMI not displaced or sustained,S1 and S2 within normal limits, no S3, no S4, no clicks, no rubs, no murmurs ABD:  Flat, positive bowel sounds normal in frequency in pitch, no bruits, no rebound, no guarding, no midline pulsatile mass, no hepatomegaly, no splenomegaly EXT:  2 plus pulses throughout, no  edema, no cyanosis no clubbing  EKG:  NSR, rate 52, LAD.  No acute ST T wave changes.  04/26/2013   ASSESSMENT AND PLAN   CAD -  The patient had a follow up stress test in 2011. Since that time he has had no new sypmtoms. No further cardiovascular testing is indicated. We will continue with aggressive risk reduction and meds as listed.   DYSLIPIDEMIA - His triglycerides are elevated recently. However, he didn't tolerate fenofibrate. His LDL is acceptable. He will continue the meds as listed.  HYPERTENSION - His blood pressure is mildly elevated today.  However, this is unusual.  No change in therapy is indicated.

## 2013-04-26 NOTE — Patient Instructions (Signed)
Your physician recommends that you continue on your current medications as directed. Please refer to the Current Medication list given to you today.  Your physician wants you to follow-up in: one year with Dr. Hochrein.  You will receive a reminder letter in the mail two months in advance. If you don't receive a letter, please call our office to schedule the follow-up appointment.  

## 2013-05-23 ENCOUNTER — Other Ambulatory Visit: Payer: Self-pay

## 2013-05-23 MED ORDER — ATENOLOL 25 MG PO TABS: 25.0000 mg | ORAL_TABLET | Freq: Every day | ORAL | Status: DC

## 2013-06-26 ENCOUNTER — Other Ambulatory Visit: Payer: Self-pay

## 2013-06-26 MED ORDER — PRAVASTATIN SODIUM 20 MG PO TABS: 20.0000 mg | ORAL_TABLET | Freq: Every day | ORAL | Status: DC

## 2013-08-05 ENCOUNTER — Other Ambulatory Visit: Payer: Self-pay

## 2013-08-05 MED ORDER — LISINOPRIL 10 MG PO TABS: ORAL_TABLET | ORAL | Status: DC

## 2013-08-13 ENCOUNTER — Encounter: Payer: Self-pay | Admitting: Pulmonary Disease

## 2013-08-13 ENCOUNTER — Ambulatory Visit (INDEPENDENT_AMBULATORY_CARE_PROVIDER_SITE_OTHER): Payer: Medicare Other | Admitting: Pulmonary Disease

## 2013-08-13 VITALS — BP 128/78 | HR 54 | Temp 97.0°F | Ht 70.5 in | Wt 208.2 lb

## 2013-08-13 DIAGNOSIS — F411 Generalized anxiety disorder: Secondary | ICD-10-CM

## 2013-08-13 DIAGNOSIS — M1990 Unspecified osteoarthritis, unspecified site: Secondary | ICD-10-CM

## 2013-08-13 DIAGNOSIS — I10 Essential (primary) hypertension: Secondary | ICD-10-CM

## 2013-08-13 DIAGNOSIS — E785 Hyperlipidemia, unspecified: Secondary | ICD-10-CM

## 2013-08-13 DIAGNOSIS — K5730 Diverticulosis of large intestine without perforation or abscess without bleeding: Secondary | ICD-10-CM

## 2013-08-13 DIAGNOSIS — K219 Gastro-esophageal reflux disease without esophagitis: Secondary | ICD-10-CM

## 2013-08-13 DIAGNOSIS — Z23 Encounter for immunization: Secondary | ICD-10-CM

## 2013-08-13 DIAGNOSIS — I2510 Atherosclerotic heart disease of native coronary artery without angina pectoris: Secondary | ICD-10-CM

## 2013-08-13 DIAGNOSIS — C61 Malignant neoplasm of prostate: Secondary | ICD-10-CM

## 2013-08-13 NOTE — Patient Instructions (Signed)
Today we updated your med list in our EPIC system...    Continue your current medications the same...  Today we gave you the follow up Pneumonia vaccine called PREVNAR-13 (this is the last pneumonia shot you will need under current guidelines)...  We also wrote a prescription for a SHINGLE vaccine that you can fill at [facility] or [facility] at your convenience...  Let's get on track w/ our low fat diet & WEIGHT REDUCTION...  Call for any questions...  Let's plan a follow up visit in 55mo with FASTING blood work & a CXR that time.Marland KitchenMarland Kitchen

## 2013-08-13 NOTE — Progress Notes (Signed)
Subjective:     Patient ID: [name], male   DOB: 1940/11/21, 73 y.o.   MRN: [id number]  HPI 72 y/o WM here for a follow up visit... he has multiple medical problems as noted below...    ~  August 19, 2009:  routine f/u doing well- business was slow & he did a job in [place] but it didn't work out, now picking back up... he is active- denies CP, palpit, SOB, swelling, etc... he fell on the ice w/ some incr in right shoulder discomfort- he will call DrDaldorf as needed... he had labs 5/10 from [place] doesn't want full labs today, due for Flu shot...  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 he denies CP, palpit, change in SOB (he has chr stable DOE), cough, phlegm, edema, etc... BP controlled on meds;  no angina;  he saw GI 5/11 w/ dysphagia- EGD 5/11 by DrPatterson showed recurrent stricture, dilated #54F maloney, 3cmHH, & changed to [place] 20mg /d... we discussed checking FASTING labs today, and adding Alprazolam Prn fr all the stress he is under.  ~  February 05, 2013:  59yr ROV>  [name] returns after a long hiatus- being cared for by [employer] for Cards, & DrGrapey for Urology;  He has numerous medical issues- addressed below, but returns asking for a nerve pill describing lots of stress w/ his business etc; we discussed trial Alpraz0.5mg  tid as needed...     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    HBP> on Aten25, Lisin10;  BP= 142/78 & wt=199# (same as 2011); he denies CP, palpit, SOB, dizzy, edema, etc...    CAD> rec to take ASA81; followed by DrHochrein & last seen 10/13- stable, no changes made...    Dyslipidemia> on Prav20;  FLP 6/14 showed TChol 169, TG 242, HDL 31, LDL 93 & we reviewed diet, exercise, & rec adding  FENOFIBRATE 160mg /d...     GI- GERD, Divertics> on Omep40;  He denies abd pain, dysphagia, n/v, c/d, blood seen; last colon by DrPatterson was 2004 & he is due for f/u procedure...     Hx Prostate Cancer> followed by DrGrapey but pt missed his last appt & is due now- Hx prostate ca 2007 w/ IMRT & hormone therapy x64yrs; slowly rising PSA per Urology but Lab 6/14 showed PSA= 3.21    DJD> on OTC analgesics as needed; he has seen DrDaldorf in the past for left knee pain...    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 We reviewed prob list, meds, xrays and labs> see below for updates >>  LABS 6/14:  FLP- chol ok but TG=242 HDL=31;  Chems- wnl;  CBC- wnl;  TSH=1.28;  PSA=3.21 (prev in 2011 it was 0.57)...  ~  August 13, 2013:  44mo ROV & Mitchel has gained 9# up to 208# today, he blames it on quitting chewing tobacco but I told him the trade-off was worth it, asked to get on diet & incr exercise... He has no new complaints or concerns...    Breathing is stable but needs to incr exercise program...    BP is controlled w/ Aten25 &  Lisin10;  BP=128/78 today & he denies CP, palpit, SOB, edema...    He saw DrHochrein 10/14- on above + ASA; stable & asymptomatic, no changes made, f/u 39yr...    Lipids treated w/ Prav20 & diet but he's gained wt as noted; intol to Fenofib previously- we reviewed diet, exercise, wt reduction strategies...    He saw DrGrapey for prostate Ca follow up 8/14> treated w/ XRT but slowly rising PSA since then, they are on Observation protocol w/ check ups every 5mo... We reviewed prob list, meds, xrays and labs> see below for updates >> Given PREVNAR-13 & Rx for Shingles vaccine...           Problem List:    BRONCHITIS, RECURRENT (ICD-491.9) - no recent URI symptoms... Hx of PULMONARY NODULE (ICD-518.89) - mult sm benign pulm nodules (?granulomas) on CTChest- last 12/07 without adenopathy, no  change, +coronary calcif seen...  ~  f/u CXR 8/08 without nodules seen... ~  f/u CXR 11/09 clear, no lesions seen... ~  f/u CXR 2/11 showed clear, NAD...  HYPERTENSION (ICD-401.9) - contolled on ATENOLOL 25mg /d, & LISINOPRIL 10mg /d...  ~  9/11:  BP=110/62 here and usually 120s/ 70s at home- denies HA, fatigue, visual changes, CP, palipit, dizziness, syncope, dyspnea, edema, etc... ~  7/14:  on Aten25, Lisin10;  BP= 142/78 & wt=199# (same as 2011); he denies CP, palpit, SOB, dizzy, edema, etc.  CAD (ICD-414.00) - followed by DrHochrein... on ASA 325mg /d. ~  NuclearStressTest 8/07 abnormal w/ ant ischemia... ~  cath 8/07 showed 95% mid-LAD stenosis, & 20% lesions in the other 2 vessels, EF=60%... subseq PTCA w/ non-drug eluting stent... ~  OV w/ Cards= 5/10 & note reviewed- BP sl elevated & Lisinopril added. ~  He saw DrHochrein 10/13> doing well, no new symptoms, exam was neg, no change in meds & rec aggressive risk factor reduction strategy...  ~  EKG 10/13 showed NSR, rate62, LAD, otherw wnl, NAD...  DYSLIPIDEMIA (ICD-272.4) - on PRAVACHOL 20mg /d and tol OK...  ~  [place] 8/08 showed TChol 168, TG 143, HDL34, LDL105 ~  FLP 2/09 showed TChol 163, TG 207, HDL 34, LDL 91 ~  FLP 5/10 showed TChol 148, TG 131, HDL 33, LDL 89 ~  FLP 9/11 showed TChol 163, TG 140, HDL 42, LDL 93 ~  FLP 6/14 on Prav20 showed TChol 169, TG 242, HDL 31, LDL 93   GERD (ICD-530.81) - prev on OMEP20 but causes diarrhea he says, and ACIPHEX 20mg /d works better...  ~  last EGD (DrPatterson)was 8/04 showing GERD & stricture dilated... ~  5/11: presented to GI w/ recurrent dysphagia- EGD showed 3cmHH, stricture dilated, no Barrett's, changed to Aciphex.  DIVERTICULOSIS OF COLON (ICD-562.10) >>  ~  last colonoscopy 8/04 by DrPatterson was normal, f/u 61yrs (scattered tics seen on 1996 flex) ~  7/14:  He is due for f/u colon 7 we will refer the chart to DrPatterson for review...  PROSTATE CANCER (ICD-185) - followed by  DrPeterson- elevated PSA found 8/07 (10.6)... biopsies were pos (Stage T2b, bilat dis w/ Gleason4+3=7 on one side 7 8 on the other) & there is a family hx as well... after consultation at [facility] they rec IMRT, then hormonal Rx for 2 yrs... XRT completed by [facility] 4/08... prev on TRELSTAR injections q 90months & PROVERA for the hot flashes>> off both now... neg bone scan 1/08... last PSA here= 0.03 in 2008... ~  2/11: we don't have recent note from [place] & pt will request info sent  to [place] at next visit. ~  9/11: labs here showed PSA= 0.57 & we will forward to [place] ~  9/13:  He had f/u visit w/ DrGrapey> PSA had been slowly rising and ~1 when they last checked; DrGrapey rec Q16mo f/u  ~  7/14:  Pt missed his 3/14 appt w/ Urology & is sched to see them 8/14; PSA recorded 6/14 = 3.21 & we sent copy to DrGrapey...  DEGENERATIVE JOINT DISEASE (ICD-715.90) - his left knee gives him some pain on and off... he saw DrDaldorf for this 12/08 & he thought there might be a torn meniscus, they decided to Rx w/ NAPROSEN which helps... may yet need MRI/ arthroscopy... ~  2/11: discussed trial of Mobic to see if this works for him.  HEALTH MAINTENANCE >>  ~  GI>  Followed by [facility] 7 last colon was 2004- f/u due now... ~  GU>  Followed by DrGrapey 7 seen every 46mo for PSA recheck but he missed the 3/14 appt & our PSA 6/14= 3.21 (copy to Urology & he has appt 8/14)... ~  Immuniz>  He gets the yearly flu vax (last 10/13);  Had Pneumovax several yrs ago;  Not sure of last Tetanus vaccine;  Asking about the shingles shot...   Past Surgical History  Procedure Laterality Date  . Eyelid surgery for lid lag    . Foot surgery    . Hydrocele excision / repair    . Lung nodules    . Mastectomy      benign tumor  . Inguinal hernia repair    . Prostate treatment      cancer  . Skin cancer excision  2012    skin cancer on right ear    Outpatient Encounter Prescriptions as of 08/13/2013   Medication Sig  . ALPRAZolam (XANAX) 0.5 MG tablet Take 1/2 to 1 tablet by mouth three times daily as needed for anxiety  . aspirin 81 MG tablet Take 81 mg by mouth daily.  Marland Kitchen atenolol (TENORMIN) 25 MG tablet Take 1 tablet (25 mg total) by mouth daily.  Marland Kitchen lisinopril (PRINIVIL,ZESTRIL) 10 MG tablet TAKE ONE TABLET BY MOUTH ONE TIME DAILY  . omeprazole (PRILOSEC) 40 MG capsule Take 1 capsule (40 mg total) by mouth daily.  . pravastatin (PRAVACHOL) 20 MG tablet Take 1 tablet (20 mg total) by mouth daily.    Allergies  Allergen Reactions  . Simvastatin     REACTION: pt states INTOL to ZOCOR w/ leg pains    Current Medications, Allergies, Past Medical History, Past Surgical History, Family History, and Social History were reviewed in [employer] record.   Review of Systems          See HPI - The patient denies anorexia, fever, weight loss, weight gain, vision loss, decreased hearing, hoarseness, chest pain, syncope, dyspnea on exertion, peripheral edema, prolonged cough, headaches, hemoptysis, abdominal pain, melena, hematochezia, severe indigestion/heartburn, hematuria, incontinence, muscle weakness, suspicious skin lesions, transient blindness, difficulty walking, depression, unusual weight change, abnormal bleeding, enlarged lymph nodes, and angioedema.     Objective:   Physical Exam     WD, WN, 73 y/o WM in NAD... GENERAL:  Alert & oriented; pleasant & cooperative... HEENT:  [place]/AT, EOM-wnl, PERRLA, EACs-clear, TMs-wnl, NOSE-clear, THROAT-clear & wnl. NECK:  Supple w/ fairROM; no JVD; normal carotid impulses w/o bruits; no thyromegaly or nodules palpated; no lymphadenopathy. CHEST:  Clear to P & A; without wheezes/ rales/ or rhonchi heard... HEART:  Regular Rhythm; without  murmurs/ rubs/ or gallops detected... ABDOMEN:  Soft & nontender; normal bowel sounds; no organomegaly or masses palpated... EXT: without deformities, mild arthritic changes; no varicose  veins/ venous insuffic/ or edema. NEURO:  CN's intact; motor testing normal; sensory testing normal; gait normal & balance OK. DERM:  No lesions noted; no rash etc...  RADIOLOGY DATA:  Reviewed in the EPIC EMR & discussed w/ the patient...  LABORATORY DATA:  Reviewed in the EPIC EMR & discussed w/ the patient...   Assessment:     Hx Bronchitis and Pulm Nodule>  He did not do f/u CXR today; mult sm granulomas seen on CTChest 2007 & CXRs have been clear...  HBP>  Stable on low dose Aten 7 Lisin but I mentioned we could easily incr one or both if his home BP monitor indicates that his pressure is up...  CAD>  Followed by DrHochrein; seen last 10/13 7 stable, no changes made; needs to incr his exercise program...  Dyslipidemia>  On Prav20 but TG & HDL deranged; rec to add FENOFIBRATE 160mg /d + diet/ exercise/ etc...  GI- GERD, Diverics, needs f/u colon>  We will refer to DrPatterson for this latter...  Prostate Ca>  Managed by DrGrapey but pt missed their last visit & PSA is up to 3.21, copy to Urology & he has f/u appt 8/124...   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  Anxiety>  He is requesting Rx- try ALPRAZOLAM 0.5mg  tid as needed...      Plan:     Patient's Medications  New Prescriptions   No medications on file  Previous Medications   ALPRAZOLAM (XANAX) 0.5 MG TABLET    Take 1/2 to 1 tablet by mouth three times daily as needed for anxiety   ASPIRIN 81 MG TABLET    Take 81 mg by mouth daily.   ATENOLOL (TENORMIN) 25 MG TABLET    Take 1 tablet (25 mg total) by mouth daily.   LISINOPRIL (PRINIVIL,ZESTRIL) 10 MG TABLET    TAKE ONE TABLET BY MOUTH ONE TIME DAILY   OMEPRAZOLE (PRILOSEC) 40 MG CAPSULE    Take 1 capsule (40 mg total) by mouth daily.   PRAVASTATIN (PRAVACHOL) 20 MG TABLET    Take 1 tablet (20 mg total) by mouth daily.  Modified Medications   No medications on file  Discontinued Medications   No medications on file

## 2014-01-07 ENCOUNTER — Other Ambulatory Visit: Payer: Self-pay | Admitting: *Deleted

## 2014-01-07 MED ORDER — LISINOPRIL 10 MG PO TABS: ORAL_TABLET | ORAL | Status: DC

## 2014-02-10 ENCOUNTER — Ambulatory Visit (INDEPENDENT_AMBULATORY_CARE_PROVIDER_SITE_OTHER)
Admission: RE | Admit: 2014-02-10 | Discharge: 2014-02-10 | Disposition: A | Discharge status: Home / Self Care | Payer: Medicare Other | Source: Ambulatory Visit | Attending: Pulmonary Disease | Admitting: Pulmonary Disease

## 2014-02-10 ENCOUNTER — Encounter: Payer: Self-pay | Admitting: Pulmonary Disease

## 2014-02-10 ENCOUNTER — Ambulatory Visit (INDEPENDENT_AMBULATORY_CARE_PROVIDER_SITE_OTHER): Payer: Medicare Other | Admitting: Pulmonary Disease

## 2014-02-10 VITALS — BP 132/82 | HR 50 | Temp 98.5°F | Ht 70.5 in | Wt 203.0 lb

## 2014-02-10 DIAGNOSIS — K5730 Diverticulosis of large intestine without perforation or abscess without bleeding: Secondary | ICD-10-CM

## 2014-02-10 DIAGNOSIS — K219 Gastro-esophageal reflux disease without esophagitis: Secondary | ICD-10-CM

## 2014-02-10 DIAGNOSIS — J42 Unspecified chronic bronchitis: Secondary | ICD-10-CM

## 2014-02-10 DIAGNOSIS — J984 Other disorders of lung: Secondary | ICD-10-CM

## 2014-02-10 DIAGNOSIS — I10 Essential (primary) hypertension: Secondary | ICD-10-CM

## 2014-02-10 DIAGNOSIS — E785 Hyperlipidemia, unspecified: Secondary | ICD-10-CM

## 2014-02-10 DIAGNOSIS — F411 Generalized anxiety disorder: Secondary | ICD-10-CM

## 2014-02-10 DIAGNOSIS — M1990 Unspecified osteoarthritis, unspecified site: Secondary | ICD-10-CM

## 2014-02-10 DIAGNOSIS — Z23 Encounter for immunization: Secondary | ICD-10-CM

## 2014-02-10 DIAGNOSIS — I2510 Atherosclerotic heart disease of native coronary artery without angina pectoris: Secondary | ICD-10-CM

## 2014-02-10 DIAGNOSIS — C61 Malignant neoplasm of prostate: Secondary | ICD-10-CM

## 2014-02-10 MED ORDER — LISINOPRIL 10 MG PO TABS: ORAL_TABLET | ORAL | Status: DC

## 2014-02-10 MED ORDER — OMEPRAZOLE 40 MG PO CPDR: 40.0000 mg | DELAYED_RELEASE_CAPSULE | Freq: Every day | ORAL | Status: DC

## 2014-02-10 MED ORDER — PRAVASTATIN SODIUM 20 MG PO TABS: 20.0000 mg | ORAL_TABLET | Freq: Every day | ORAL | Status: DC

## 2014-02-10 MED ORDER — ATENOLOL 25 MG PO TABS: 25.0000 mg | ORAL_TABLET | Freq: Every day | ORAL | Status: DC

## 2014-02-10 NOTE — Patient Instructions (Signed)
Today we updated your med list in our EPIC system...    Continue your current medications the same...  Today we did your follow up CXR... Please return to our lab one morning soon for your FASTING blood work...    We will contact you w/ the results when available...   Keep up the good work w/ diet & exercise...    Let's shoot for ~190 lbs...  Call for any questions...  Let's plan a follow up visit in 34mo, sooner if needed for problems.Marland KitchenMarland Kitchen

## 2014-02-10 NOTE — Progress Notes (Addendum)
Subjective:     Patient ID: [name], male   DOB: 15-Feb-1941, 73 y.o.   MRN: [id number]  HPI 73 y/o WM here for a follow up visit... he has multiple medical problems as noted below...    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 he denies CP, palpit, change in SOB (he has chr stable DOE), cough, phlegm, edema, etc... BP controlled on meds;  no angina;  he saw GI 5/11 w/ dysphagia- EGD 5/11 by DrPatterson showed recurrent stricture, dilated #39F maloney, 3cmHH, & changed to AHolcomb268md... we discussed checking FASTING labs today, and adding Alprazolam Prn fr all the stress he is under.  CXR 2/11 showed norm heart size, clear lungs w/o nodules appreciated, NAD...   ~  February 05, 2013:  [id number]>  VanMyquanturns after a long hiatus- being cared for by DrHClear Channel Communicationsr Cards, & DrGrapey for Urology;  He has numerous medical issues- addressed below, but returns asking for a nerve pill describing lots of stress w/ his business etc; we discussed trial Alpraz0.5mg65md as needed...     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    HBP> on Aten25, Lisin10;  BP= 142/78 & wt=199# (same as 2011); he denies CP, palpit, SOB, dizzy, edema, etc...    CAD> rec to take ASA81; followed by DrHochrein & last seen 10/13- stable, no changes made...    Dyslipidemia> on Prav20;  FLP 6/14 showed TChol 169, TG 242, HDL 31, LDL 93 & we reviewed diet, exercise, & rec adding FENOFIBRATE 160mg69m.     GI- GERD, Divertics> on Omep40;  He denies abd pain, dysphagia, n/v, c/d, blood seen; last colon by DrPatterson was 2004 & he is due for f/u procedure...     Hx Prostate Cancer> followed by DrGrapey but pt missed his last appt & is due now- Hx prostate ca 2007 w/ IMRT &  hormone therapy x2yrs;45yrwly rising PSA per Urology but Lab 6/14 showed PSA= 3.21    DJD> on OTC analgesics as needed; he has seen DrDaldorf in the past for left knee pain...    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 We reviewed prob list, meds, xrays and labs> see below for updates >>   LABS 6/14:  FLP- chol ok but TG=242 HDL=31;  Chems- wnl;  CBC- wnl;  TSH=1.28;  PSA=3.21 (prev in 2011 it was 0.57)...  ~  August 13, 2013:  46mo ROV23moance has gained 9# up to 208# today, he blames it on quitting chewing tobacco but I told him the trade-off was worth it, asked to get on diet & incr exercise... He has no new complaints or concerns...    Breathing is stable but needs to incr exercise program...    BP is controlled w/ Aten25 & Lisin10;  BP=128/78 today & he denies CP, palpit, SOB, edema...    He saw DrHochrein 10/14- on above + ASA; stable & asymptomatic, no changes made, f/u 73yr...  44yrpids treated w/ Prav20 & diet but he's gained wt as noted; intol to Fenofib previously- we reviewed diet, exercise, wt reduction strategies...Marland KitchenMarland Kitchen  He saw DrGrapey for prostate Ca follow up 8/14> treated w/ XRT but slowly rising PSA since then, they are on Observation protocol w/ check ups every 9mo.. We reviewed prob list, meds, xrays and labs> see below for updates >> Given PREVNAR-13 & Rx for Shingles vaccine...   EKG 10/14 by DrHochrein showed SBrady, rate52, borderline EKG, NAD...   ADDENDUM> PSA 2/15 by DrGrapey was 4.26...   ~  February 10, 2014:  659moOV and VaForestndicates that he is doing well, no new complaints or concerns;  He has a rising PSA- followed by DrGrapey Q6m61mo PSADT est ~83m592moast measured 4.26 in Feb2015;  He saw Urology 3/15 & note reviewed (prev hx well outlined) and plan was to continue monitoring Q92mo 592mol PSA ~10 then consider hormone therapy...  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    HBP> on Aten25, Lisin10;  BP= 132/82 & wt=203# (down 5# last 92mo);8modenies CP, palpit, SOB, dizzy, edema, etc...    CAD> on ASA81; followed by DrHochrein & last seen 10/14- remains stable, no changes made, f/u 9yr...78yrDyslipidemia> on Prav20; hx intol to Fenofibrate; FLP 6/14 showed TChol 169, TG 242, HDL 31, LDL 93 & we reviewed diet, exercise, & wt reduction...    GI- GERD, Divertics> on Omep40;  He denies abd pain, dysphagia, n/v, c/d, blood seen; last colon by DrPatterson was 8/14 & showed mod divertics, no polyps, and he rec f/u colon for routine risk ~64yrs  39yrProstate Cancer> followed by DrGrapey w/ hx prostate ca 2007 w/ IMRT & hormone therapy x2yrs; he22yr had slowly rising PSA= 3.21 June2014,[id number]= 4.26 Feb2015, [id number] PSA= 6.15 (SEE BELOW)    DJD> on OTC analgesics as needed; he has seen DrDaldorf in the past for left knee pain...    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 We reviewed prob list, meds, xrays and labs> see below for updates >>   CXR 7/15 shows norm heart size & Ao atherosclerosis, bilat pulm nodules are visualized (new since 2/11 CXR) w/ largest ~2cm in LUL...    LABS 7/15:  FLP- not at goals w/ TG elev & HDL low;  Chems- ok x BS=135;  CBC- wnl;  TSH=1.38;  PSA=6.15...  CT Chest/ Abd/ Pelvis 7/15 showed numerous bilat pulm nodules; no signif findings in abd/pelvis x gallstones, adrenal adenoma, scat divertics, and atherosclerotic changes; degen  changes in spine PLAN>  CXR shows recurrent lung nodules (assoc w/ his rising PSA); prev noted nodules from 2007 were assoc w/ PSA of 10.6 at time of Dx, and the CXR cleared w/o nodules seen in 2008, 2009, 2011; he has been followed by Urology w/ slowly rising PSA at 3.1 last yr and 4.26 earlier this yr, and now=6.15   With f/u CXR showing recurrent mult bilat pulm nodules (also seen at time of Prostate cancer Dx in 2007 & resolved (2008, 2009, 2011) after XRT &  hormone therapy at time of dx...   PET Scan 02/20/14 showed numerous bilat noncalcif pulm nodules, 5 measured w/ max SUV 1.6-4.6, coronary atherosclerosis, no skeletal metssmall right adrenal nodule (adenma favored)...  Nuclear Bone Scan 02/27/14 showed focal areas of uptake in spine (most likely degenerative) & knee/ shoulder- no convincing evid for mets to bone...  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  PLAN>  He has upcoming appt w/ DrGrapey for Urology           Problem List:    BRONCHITIS, RECURRENT (ICD-491.9) - no recent URI symptoms... 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 ~  CXR 2007 when coronary stent placed showed ?sm nodules seen... ~  CT Chest 8/07 showed bilat noncalcif lung nodules, varying size in all lobes & largest= 9.78mm in LLL, no adenopathy... note: PSA=10.6 ~  CT Abd&Pelvis 9/07 showed mult sm lung nodules at bases bilat, gallstones, sm duod divertic, scat atherosclerotic calcif, no mass or adenop, asymmetric enlarged prostate...  ~  CT Chest 12/07 showed mult pulm nodules bilat- no change, no adenopathy, coronary calcif...  ~  f/u CXR 8/08 without nodules seen... (note: PSA= 0.03) ~  f/u CXR 11/09 clear, no lesions seen... ~  f/u CXR 2/11 showed clear, NAD.Marland Kitchen. (note: PSA= 0.57) ~  CXR 7/15 shows norm heart size & Ao atherosclerosis, bilat  pulm nodules are visualized (new since 2/11 CXR) w/ largest ~2cm in LUL... 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  HYPERTENSION (ICD-401.9) - contolled on ATENOLOL 25mg /d, & LISINOPRIL 10mg /d...  ~  9/11:  BP=110/62 here and usually 120s/ 70s at home- denies HA, fatigue, visual changes, CP, palipit, dizziness, syncope, dyspnea, edema, etc... ~  7/14:  on Aten25, Lisin10;  BP= 142/78 & wt=199# (same as 2011); he denies CP, palpit, SOB, dizzy, edema, etc. ~  7/15: on Aten25, Lisin10;  BP= 132/82 & wt=203# (down 5# last 99mo); he remains asymptomatic...  CAD (ICD-414.00) - followed by DrHochrein... on ASA 325mg /d. ~  NuclearStressTest 8/07 abnormal w/ ant ischemia... ~  cath 8/07 showed 95% mid-LAD stenosis, & 20% lesions in the other 2 vessels, EF=60%... subseq PTCA w/ non-drug eluting stent... ~  OV w/ Cards= 5/10 & note reviewed- BP sl elevated & Lisinopril added. ~  He saw DrHochrein 10/13> doing well, no new symptoms, exam was neg, no change in meds & rec aggressive risk factor reduction strategy...  ~  EKG 10/13 showed NSR, rate62, LAD, otherw wnl, NAD... ~  He saw DrHochrein 10/14- on above + ASA; stable & asymptomatic, no changes made, f/u 89yr  DYSLIPIDEMIA (ICD-272.4) - on PRAVACHOL 20mg /d and tol OK...  ~  [place] 8/08 showed TChol 168, TG 143, HDL34, LDL105 ~  FLP 2/09 showed TChol 163, TG 207, HDL 34, LDL 91 ~  FLP 5/10 showed TChol 148, TG 131, HDL 33, LDL 89 ~  FLP 9/11 showed TChol 163, TG 140, HDL 42, LDL 93 ~  FLP 6/14 on Prav20 showed TChol 169, TG 242, HDL 31, LDL 93  ~  FLP 7/15 on Prav20 showed => pending  GERD (ICD-530.81) - prev on OMEP20 but causes diarrhea he says, and ACIPHEX 20mg /d works better...  ~  last EGD (DrPatterson)was  8/04 showing GERD & stricture dilated... ~  5/11: presented to GI w/ recurrent dysphagia- EGD showed 3cmHH, stricture dilated, no Barrett's,  changed to Aciphex. ~  On Omep40 & he remains asymptomatic w/o dysphagia, CP, abd pain, n/v, c/d, blood seen...  DIVERTICULOSIS OF COLON (ICD-562.10) >>  ~  Colonoscopy 8/04 by DrPatterson was normal, f/u 10yrs (scattered tics seen on 1996 flex) ~  7/14:  He is due for f/u colon 7 we will refer the chart to DrPatterson for review... ~  Colonoscopy 8/14 by DrPatterson showed mod divertics, no polyps, and he rec f/u colon for routine risk ~24yrs...  PROSTATE CANCER (ICD-185) - followed by DrPeterson- elevated PSA found 8/07 (10.6)... biopsies were pos (Stage T2b, bilat dis w/ Gleason4+3=7 on one side & 8 on the other) & there is a family hx as well; he had bilat pulm nodules on CXR & CT Chest at diagnosis... after consultation at [facility] they rec IMRT, then hormonal Rx for 2 yrs... XRT completed by [facility] 4/08... prev on TRELSTAR injections q 90months & PROVERA for the hot flashes>> x34yrs;  neg bone scan 1/08;  PSA here= 0.03 in 2008, and CXR cleared w/o nodules seen in 2008, 2009, 2011 (see above)... ~  2/11: we don't have recent note from [place] & pt will request info sent to [place] at next visit. ~  9/11: labs here showed PSA= 0.57 & we will forward to [place] ~  9/13:  He had f/u visit w/ DrGrapey> PSA had been slowly rising and ~1 when they last checked; DrGrapey rec Q40mo f/u  ~  7/14:  Pt missed his 3/14 appt w/ Urology & is sched to see them 8/14; PSA recorded 6/14 = 3.21 & we sent copy to DrGrapey... ~  3/15:  He had f/u DrGrapey> prostate Ca dx 2007, he got 2nd opinion at [place], started on hormone therapy (x71yrs) & IMRT in 2008; slowly rising PSA w/ doubling time ~48mo now; DrGrapey follows pt Q19mo & plan is for hormone therapy when PSA>10... ~  7/15:  Routine CXR showed return of bilat pulm nodules assoc w/ PSA=   (CT Chest, Abd, Pelvis is planned & discussion w/ DrGrapey & Oncology)...  DEGENERATIVE JOINT DISEASE (ICD-715.90) - his left knee gives him some pain on and off...  he saw DrDaldorf for this 12/08 & he thought there might be a torn meniscus, they decided to Rx w/ NAPROSEN which helps... may yet need MRI/ arthroscopy... ~  2/11: discussed trial of Mobic to see if this works for him.  HEALTH MAINTENANCE >>  ~  GI>  Followed by [facility] & last colon was 2004- f/u due now... ~  GU>  Followed by DrGrapey & seen every 11mo for PSA recheck but he missed the 3/14 appt & our PSA 6/14= 3.21 (copy to Urology & he has appt 8/14)... ~  Immuniz>  He gets the yearly flu vax (last 10/13);  Had Pneumovax several yrs ago;  Not sure of last Tetanus vaccine;  Asking about the shingles shot...   Past Surgical History  Procedure Laterality Date  . Eyelid surgery for lid lag    . Foot surgery    . Hydrocele excision / repair    . Lung nodules    . Mastectomy      benign tumor  . Inguinal hernia repair    . Prostate treatment      cancer  . Skin cancer excision  2012    skin cancer on right ear  Outpatient Encounter Prescriptions as of 02/10/2014  Medication Sig  . ALPRAZolam (XANAX) 0.5 MG tablet Take 1/2 to 1 tablet by mouth three times daily as needed for anxiety  . aspirin 81 MG tablet Take 81 mg by mouth daily.  Marland Kitchen atenolol (TENORMIN) 25 MG tablet Take 1 tablet (25 mg total) by mouth daily.  Marland Kitchen lisinopril (PRINIVIL,ZESTRIL) 10 MG tablet TAKE ONE TABLET BY MOUTH ONE TIME DAILY  . omeprazole (PRILOSEC) 40 MG capsule Take 1 capsule (40 mg total) by mouth daily.  . pravastatin (PRAVACHOL) 20 MG tablet Take 1 tablet (20 mg total) by mouth daily.  . [DISCONTINUED] atenolol (TENORMIN) 25 MG tablet Take 1 tablet (25 mg total) by mouth daily.  . [DISCONTINUED] lisinopril (PRINIVIL,ZESTRIL) 10 MG tablet TAKE ONE TABLET BY MOUTH ONE TIME DAILY  . [DISCONTINUED] omeprazole (PRILOSEC) 40 MG capsule Take 1 capsule (40 mg total) by mouth daily.  . [DISCONTINUED] pravastatin (PRAVACHOL) 20 MG tablet Take 1 tablet (20 mg total) by mouth daily.    Allergies  Allergen  Reactions  . Simvastatin     REACTION: pt states INTOL to [facility] w/ leg pains    Current Medications, Allergies, Past Medical History, Past Surgical History, Family History, and Social History were reviewed in [employer] record.   Review of Systems         See HPI - The patient denies anorexia, fever, weight loss, weight gain, vision loss, decreased hearing, hoarseness, chest pain, syncope, dyspnea on exertion, peripheral edema, prolonged cough, headaches, hemoptysis, abdominal pain, melena, hematochezia, severe indigestion/heartburn, hematuria, incontinence, muscle weakness, suspicious skin lesions, transient blindness, difficulty walking, depression, unusual weight change, abnormal bleeding, enlarged lymph nodes, and angioedema.     Objective:   Physical Exam    WD, WN, 72 y/o WM in NAD... GENERAL:  Alert & oriented; pleasant & cooperative... HEENT:  [place]/AT, EOM-wnl, PERRLA, EACs-clear, TMs-wnl, NOSE-clear, THROAT-clear & wnl. NECK:  Supple w/ fairROM; no JVD; normal carotid impulses w/o bruits; no thyromegaly or nodules palpated; no lymphadenopathy. CHEST:  Clear to P & A; without wheezes/ rales/ or rhonchi heard... HEART:  Regular Rhythm; without murmurs/ rubs/ or gallops detected... ABDOMEN:  Soft & nontender; normal bowel sounds; no organomegaly or masses palpated... EXT: without deformities, mild arthritic changes; no varicose veins/ venous insuffic/ or edema. NEURO:  CN's intact; motor testing normal; sensory testing normal; gait normal & balance OK. DERM:  No lesions noted; no rash etc...  RADIOLOGY DATA:  Reviewed in the EPIC EMR & discussed w/ the patient...  LABORATORY DATA:  Reviewed in the EPIC EMR & discussed w/ the patient...   Assessment:      Hx Bronchitis and Mult Pulm Nodules>  His breathing has been fine, at baseline w/o new complaints etc;  Previous mult pulm nodules from 2007 were likely prostate ca mets that resolved w/ his treatment  IMRT 2008 and hormone therapy x4yrs;  CXRs in 2008, 2009, & 2011 were clear;  Current CXR shows return of bilat pulm nodules (assoc w/ rising PSA) & we will proceed w/ CT Chest, Abd, Pelvis & discuss further eval w/ DrGrapey...  HBP>  Stable on low dose Aten & Lisin, continue same...  CAD>  Followed by DrHochrein; seen last 10/14 & he remains stable, no changes made; needs to incr his exercise program...  Dyslipidemia>  On Prav20 but TG & HDL deranged; needs better diet, exercise, & wt reduction...  GI- GERD, Diverics, needs f/u colon>  F/u colon 8/14 was neg x  divertics, no polyps, f/u rec for 42yrs...  Prostate Ca>  Managed by DrGrapey as noted; rising PSA indicates recurrent dis and I suspect mult lung nodules represent metastatic dis...  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  Anxiety>  He is requesting Rx- try ALPRAZOLAM 0.5mg  tid as needed...      Plan:     Patient's Medications  New Prescriptions   No medications on file  Previous Medications   ALPRAZOLAM (XANAX) 0.5 MG TABLET    Take 1/2 to 1 tablet by mouth three times daily as needed for anxiety   ASPIRIN 81 MG TABLET    Take 81 mg by mouth daily.  Modified Medications   Modified Medication Previous Medication   ATENOLOL (TENORMIN) 25 MG TABLET atenolol (TENORMIN) 25 MG tablet      Take 1 tablet (25 mg total) by mouth daily.    Take 1 tablet (25 mg total) by mouth daily.   LISINOPRIL (PRINIVIL,ZESTRIL) 10 MG TABLET lisinopril (PRINIVIL,ZESTRIL) 10 MG tablet      TAKE ONE TABLET BY MOUTH ONE TIME DAILY    TAKE ONE TABLET BY MOUTH ONE TIME DAILY   OMEPRAZOLE (PRILOSEC) 40 MG CAPSULE omeprazole (PRILOSEC) 40 MG capsule      Take 1 capsule (40 mg total) by mouth daily.    Take 1 capsule (40 mg total) by mouth daily.   PRAVASTATIN (PRAVACHOL) 20 MG TABLET pravastatin (PRAVACHOL) 20 MG tablet      Take 1 tablet (20 mg total) by mouth daily.    Take 1 tablet (20 mg total) by mouth daily.  Discontinued  Medications   No medications on file

## 2014-02-11 ENCOUNTER — Other Ambulatory Visit: Payer: Self-pay | Admitting: Pulmonary Disease

## 2014-02-11 DIAGNOSIS — J984 Other disorders of lung: Secondary | ICD-10-CM

## 2014-02-11 DIAGNOSIS — C61 Malignant neoplasm of prostate: Secondary | ICD-10-CM

## 2014-02-12 ENCOUNTER — Other Ambulatory Visit (INDEPENDENT_AMBULATORY_CARE_PROVIDER_SITE_OTHER): Payer: Medicare Other

## 2014-02-12 DIAGNOSIS — E785 Hyperlipidemia, unspecified: Secondary | ICD-10-CM

## 2014-02-12 DIAGNOSIS — K5730 Diverticulosis of large intestine without perforation or abscess without bleeding: Secondary | ICD-10-CM

## 2014-02-12 DIAGNOSIS — F411 Generalized anxiety disorder: Secondary | ICD-10-CM

## 2014-02-12 DIAGNOSIS — C61 Malignant neoplasm of prostate: Secondary | ICD-10-CM

## 2014-02-12 DIAGNOSIS — I10 Essential (primary) hypertension: Secondary | ICD-10-CM

## 2014-02-12 LAB — LIPID PANEL
CHOL/HDL RATIO: 6
Cholesterol: 174 mg/dL (ref 0–200)
HDL: 31.4 mg/dL — ABNORMAL LOW (ref >39.00)
NonHDL: 142.6
Triglycerides: 290 mg/dL — ABNORMAL HIGH (ref 0.0–149.0)
VLDL: 58 mg/dL — AB (ref 0.0–40.0)

## 2014-02-12 LAB — CBC WITH DIFFERENTIAL/PLATELET
Basophils Absolute: 0 10*3/uL (ref 0.0–0.1)
Basophils Relative: 0.5 % (ref 0.0–3.0)
EOS PCT: 4.7 % (ref 0.0–5.0)
Eosinophils Absolute: 0.3 10*3/uL (ref 0.0–0.7)
HEMATOCRIT: 42.7 % (ref 39.0–52.0)
HEMOGLOBIN: 14.5 g/dL (ref 13.0–17.0)
LYMPHS ABS: 3.1 10*3/uL (ref 0.7–4.0)
Lymphocytes Relative: 45.6 % (ref 12.0–46.0)
MCHC: 33.9 g/dL (ref 30.0–36.0)
MCV: 93.8 fl (ref 78.0–100.0)
MONO ABS: 0.7 10*3/uL (ref 0.1–1.0)
Monocytes Relative: 10.2 % (ref 3.0–12.0)
Neutro Abs: 2.7 10*3/uL (ref 1.4–7.7)
Neutrophils Relative %: 39 % — ABNORMAL LOW (ref 43.0–77.0)
Platelets: 173 10*3/uL (ref 150.0–400.0)
RBC: 4.56 Mil/uL (ref 4.22–5.81)
RDW: 13.4 % (ref 11.5–15.5)
WBC: 6.9 10*3/uL (ref 4.0–10.5)

## 2014-02-12 LAB — BASIC METABOLIC PANEL
BUN: 19 mg/dL (ref 6–23)
CO2: 32 mEq/L (ref 19–32)
CREATININE: 1 mg/dL (ref 0.4–1.5)
Calcium: 9 mg/dL (ref 8.4–10.5)
Chloride: 105 mEq/L (ref 96–112)
GFR: 76.21 mL/min (ref >60.00)
Glucose, Bld: 135 mg/dL — ABNORMAL HIGH (ref 70–99)
POTASSIUM: 4.4 meq/L (ref 3.5–5.1)
Sodium: 140 mEq/L (ref 135–145)

## 2014-02-12 LAB — HEPATIC FUNCTION PANEL
ALT: 39 U/L (ref 0–53)
AST: 29 U/L (ref 0–37)
Albumin: 3.8 g/dL (ref 3.5–5.2)
Alkaline Phosphatase: 58 U/L (ref 39–117)
Bilirubin, Direct: 0.1 mg/dL (ref 0.0–0.3)
Total Bilirubin: 0.8 mg/dL (ref 0.2–1.2)
Total Protein: 6.2 g/dL (ref 6.0–8.3)

## 2014-02-12 LAB — TSH: TSH: 1.38 u[IU]/mL (ref 0.35–4.50)

## 2014-02-12 LAB — LDL CHOLESTEROL, DIRECT: Direct LDL: 86.9 mg/dL

## 2014-02-12 LAB — PSA: PSA: 6.15 ng/mL — AB (ref 0.10–4.00)

## 2014-02-14 ENCOUNTER — Ambulatory Visit (INDEPENDENT_AMBULATORY_CARE_PROVIDER_SITE_OTHER)
Admission: RE | Admit: 2014-02-14 | Discharge: 2014-02-14 | Disposition: A | Discharge status: Home / Self Care | Payer: Medicare Other | Source: Ambulatory Visit | Attending: Pulmonary Disease | Admitting: Pulmonary Disease

## 2014-02-14 DIAGNOSIS — C61 Malignant neoplasm of prostate: Secondary | ICD-10-CM

## 2014-02-14 DIAGNOSIS — J984 Other disorders of lung: Secondary | ICD-10-CM

## 2014-02-14 MED ORDER — IOHEXOL 300 MG/ML  SOLN
100.0000 mL | Freq: Once | INTRAMUSCULAR | Status: AC | PRN
  Administered 2014-02-14: 100 mL via INTRAVENOUS

## 2014-02-17 ENCOUNTER — Other Ambulatory Visit: Payer: Self-pay | Admitting: Pulmonary Disease

## 2014-02-17 DIAGNOSIS — R911 Solitary pulmonary nodule: Secondary | ICD-10-CM

## 2014-02-20 ENCOUNTER — Ambulatory Visit (HOSPITAL_COMMUNITY)
Admission: RE | Admit: 2014-02-20 | Discharge: 2014-02-20 | Disposition: A | Discharge status: Home / Self Care | Payer: Medicare Other | Source: Ambulatory Visit | Attending: Pulmonary Disease | Admitting: Pulmonary Disease

## 2014-02-20 DIAGNOSIS — E278 Other specified disorders of adrenal gland: Secondary | ICD-10-CM | POA: Insufficient documentation

## 2014-02-20 DIAGNOSIS — I2510 Atherosclerotic heart disease of native coronary artery without angina pectoris: Secondary | ICD-10-CM | POA: Insufficient documentation

## 2014-02-20 DIAGNOSIS — C61 Malignant neoplasm of prostate: Secondary | ICD-10-CM | POA: Insufficient documentation

## 2014-02-20 DIAGNOSIS — R918 Other nonspecific abnormal finding of lung field: Secondary | ICD-10-CM | POA: Diagnosis not present

## 2014-02-20 DIAGNOSIS — R911 Solitary pulmonary nodule: Secondary | ICD-10-CM

## 2014-02-20 LAB — GLUCOSE, CAPILLARY: Glucose-Capillary: 111 mg/dL — ABNORMAL HIGH (ref 70–99)

## 2014-02-20 MED ORDER — FLUDEOXYGLUCOSE F - 18 (FDG) INJECTION
10.1000 | Freq: Once | INTRAVENOUS | Status: AC | PRN
Start: 2014-02-20 — End: 2014-02-20
  Administered 2014-02-20: 10.1 via INTRAVENOUS

## 2014-02-21 ENCOUNTER — Other Ambulatory Visit: Payer: Self-pay | Admitting: Pulmonary Disease

## 2014-02-21 DIAGNOSIS — J984 Other disorders of lung: Secondary | ICD-10-CM

## 2014-02-21 DIAGNOSIS — C61 Malignant neoplasm of prostate: Secondary | ICD-10-CM

## 2014-02-27 ENCOUNTER — Encounter (HOSPITAL_COMMUNITY)
Admission: RE | Admit: 2014-02-27 | Discharge: 2014-02-27 | Disposition: A | Discharge status: Home / Self Care | Payer: Medicare Other | Source: Ambulatory Visit | Attending: Pulmonary Disease | Admitting: Pulmonary Disease

## 2014-02-27 DIAGNOSIS — C61 Malignant neoplasm of prostate: Secondary | ICD-10-CM | POA: Diagnosis present

## 2014-02-27 DIAGNOSIS — J984 Other disorders of lung: Secondary | ICD-10-CM

## 2014-02-27 MED ORDER — TECHNETIUM TC 99M MEDRONATE IV KIT
26.0000 | PACK | Freq: Once | INTRAVENOUS | Status: AC | PRN
  Administered 2014-02-27: 26 via INTRAVENOUS

## 2014-02-28 ENCOUNTER — Other Ambulatory Visit: Payer: Self-pay | Admitting: Radiology

## 2014-03-03 ENCOUNTER — Ambulatory Visit (HOSPITAL_COMMUNITY): Admission: RE | Admit: 2014-03-03 | Payer: Medicare Other | Source: Ambulatory Visit

## 2014-03-04 ENCOUNTER — Other Ambulatory Visit: Payer: Self-pay | Admitting: Radiology

## 2014-03-05 ENCOUNTER — Encounter (HOSPITAL_COMMUNITY): Payer: Self-pay | Admitting: Pharmacy Technician

## 2014-03-05 ENCOUNTER — Other Ambulatory Visit: Payer: Self-pay | Admitting: Radiology

## 2014-03-06 ENCOUNTER — Encounter (HOSPITAL_COMMUNITY): Payer: Self-pay

## 2014-03-06 ENCOUNTER — Ambulatory Visit (HOSPITAL_COMMUNITY)
Admission: RE | Admit: 2014-03-06 | Discharge: 2014-03-06 | Disposition: A | Discharge status: Home / Self Care | Payer: Medicare Other | Source: Ambulatory Visit | Attending: Interventional Radiology | Admitting: Interventional Radiology

## 2014-03-06 ENCOUNTER — Ambulatory Visit (HOSPITAL_COMMUNITY)
Admission: RE | Admit: 2014-03-06 | Discharge: 2014-03-06 | Disposition: A | Discharge status: Home / Self Care | Payer: Medicare Other | Source: Ambulatory Visit | Attending: Pulmonary Disease | Admitting: Pulmonary Disease

## 2014-03-06 DIAGNOSIS — R911 Solitary pulmonary nodule: Secondary | ICD-10-CM | POA: Insufficient documentation

## 2014-03-06 DIAGNOSIS — J984 Other disorders of lung: Secondary | ICD-10-CM

## 2014-03-06 DIAGNOSIS — C61 Malignant neoplasm of prostate: Secondary | ICD-10-CM | POA: Diagnosis present

## 2014-03-06 LAB — CBC
HCT: 41.3 % (ref 39.0–52.0)
Hemoglobin: 14.6 g/dL (ref 13.0–17.0)
MCH: 32.2 pg (ref 26.0–34.0)
MCHC: 35.4 g/dL (ref 30.0–36.0)
MCV: 91 fL (ref 78.0–100.0)
Platelets: 167 10*3/uL (ref 150–400)
RBC: 4.54 MIL/uL (ref 4.22–5.81)
RDW: 12.8 % (ref 11.5–15.5)
WBC: 5.9 10*3/uL (ref 4.0–10.5)

## 2014-03-06 LAB — APTT: aPTT: 30 seconds (ref 24–37)

## 2014-03-06 LAB — PROTIME-INR
INR: 0.99 (ref 0.00–1.49)
PROTHROMBIN TIME: 13.1 s (ref 11.6–15.2)

## 2014-03-06 MED ORDER — FENTANYL CITRATE 0.05 MG/ML IJ SOLN
INTRAMUSCULAR | Status: AC | PRN
  Administered 2014-03-06 (×2): 12.5 ug via INTRAVENOUS
  Administered 2014-03-06 (×2): 25 ug via INTRAVENOUS

## 2014-03-06 MED ORDER — MIDAZOLAM HCL 2 MG/2ML IJ SOLN
INTRAMUSCULAR | Status: AC | PRN
  Administered 2014-03-06: 0.5 mg via INTRAVENOUS
  Administered 2014-03-06: 1 mg via INTRAVENOUS
  Administered 2014-03-06 (×2): 0.5 mg via INTRAVENOUS

## 2014-03-06 MED ORDER — SODIUM CHLORIDE 0.9 % IV SOLN
INTRAVENOUS | Status: DC
  Administered 2014-03-06: 08:00:00 via INTRAVENOUS

## 2014-03-06 MED ORDER — MIDAZOLAM HCL 2 MG/2ML IJ SOLN
INTRAMUSCULAR | Status: AC
  Filled 2014-03-06: qty 4

## 2014-03-06 MED ORDER — LIDOCAINE HCL 1 % IJ SOLN
INTRAMUSCULAR | Status: AC
  Filled 2014-03-06: qty 10

## 2014-03-06 MED ORDER — FENTANYL CITRATE 0.05 MG/ML IJ SOLN
INTRAMUSCULAR | Status: AC
  Filled 2014-03-06: qty 2

## 2014-03-06 NOTE — Sedation Documentation (Signed)
Patient denies pain and is resting comfortably.  

## 2014-03-06 NOTE — Discharge Instructions (Signed)
Biopsy Care After Refer to this sheet in the next few weeks. These instructions provide you with information on caring for yourself after your procedure. Your caregiver may also give you more specific instructions. Your treatment has been planned according to current medical practices, but problems sometimes occur. Call your caregiver if you have any problems or questions after your procedure. If you had a fine needle biopsy, you may have soreness at the biopsy site for 1 to 2 days. If you had an open biopsy, you may have soreness at the biopsy site for 3 to 4 days. HOME CARE INSTRUCTIONS   You may resume normal diet and activities as directed.  Change bandages (dressings) as directed. If your wound was closed with a skin glue (adhesive), it will wear off and begin to peel in 7 days.  Only take over-the-counter or prescription medicines for pain, discomfort, or fever as directed by your caregiver.  Ask your caregiver when you can bathe and get your wound wet. SEEK IMMEDIATE MEDICAL CARE IF:   You have increased bleeding (more than a small spot) from the biopsy site.  You notice redness, swelling, or increasing pain at the biopsy site.  You have pus coming from the biopsy site.  You have a fever.  You notice a bad smell coming from the biopsy site or dressing.  You have a rash, have difficulty breathing, or have any allergic problems. MAKE SURE YOU:   Understand these instructions.  Will watch your condition.  Will get help right away if you are not doing well or get worse. Document Released: 01/21/2005 Document Revised: 09/26/2011 Document Reviewed: 12/30/2010 ExitCare Patient Information 2015 [employer]. This information is not intended to replace advice given to you by your health care provider. Make sure you discuss any questions you have with your health care provider.  

## 2014-03-06 NOTE — Procedures (Signed)
LLL lung nodule Bx No comp

## 2014-03-06 NOTE — H&P (Signed)
[name] is an 73 y.o. male.   Chief Complaint: Pt was seen for routine follow up for known prostate cancer and was noted to have elevated PSA. CXR showed change in previously seen (2007) lung nodules: more in number and enlarging nodules. CT and PET confirmed new findings with +PET Now scheduled for Left lung mass biopsy  HPI: CAD; HTN; HLD; Prostate ca; quit smoking 10-12 yrs ago- quit chewing tobacco 2 yrs ago  Past Medical History  Diagnosis Date  . Recurrent aspiration bronchitis/pneumonia   . Pulmonary nodule   . CAD (coronary artery disease)     anterior ischemia on a stress perfusion study.  (Catheterization in 2007, demonstrating 95% mid-LAD stenosis.  The circumflex has 20% proximal stenosis, the right coronary artery had 20% mid stenosis.  The EF was 60%.  He had a non drug eluting stent placed.    Marland Kitchen Unspecified essential hypertension   . Other and unspecified hyperlipidemia   . Esophageal reflux   . Esophageal stricture   . Diverticulosis of colon (without mention of hemorrhage)   . Malignant neoplasm of prostate   . Osteoarthrosis, unspecified whether generalized or localized, unspecified site   . Osteoarthrosis, unspecified whether generalized or localized, unspecified site     Past Surgical History  Procedure Laterality Date  . Eyelid surgery for lid lag    . Foot surgery    . Hydrocele excision / repair    . Lung nodules    . Mastectomy      benign tumor  . Inguinal hernia repair    . Prostate treatment      cancer  . Skin cancer excision  2012    skin cancer on right ear    Family History  Problem Relation Age of Onset  . Prostate cancer Father   . Prostate cancer Brother   . Colon cancer Neg Hx    Social History:  reports that he quit smoking about 36 years ago. His smoking use included Cigarettes. He has a 18 pack-year smoking history. His smokeless tobacco use includes Snuff. He reports that he drinks about 12.6 ounces of alcohol per week. He  reports that he does not use illicit drugs.  Allergies:  Allergies  Allergen Reactions  . Simvastatin Other (See Comments)    REACTION: pt states INTOL to [facility] w/ leg pains     (Not in a hospital admission)  Results for orders placed during the hospital encounter of 03/06/14 (from the past 48 hour(s))  APTT     Status: None   Collection Time    03/06/14  7:51 AM      Result Value Ref Range   aPTT 30  24 - 37 seconds  CBC     Status: None   Collection Time    03/06/14  7:51 AM      Result Value Ref Range   WBC 5.9  4.0 - 10.5 K/uL   RBC 4.54  4.22 - 5.81 MIL/uL   Hemoglobin 14.6  13.0 - 17.0 g/dL   HCT 41.3  39.0 - 52.0 %   MCV 91.0  78.0 - 100.0 fL   MCH 32.2  26.0 - 34.0 pg   MCHC 35.4  30.0 - 36.0 g/dL   RDW 12.8  11.5 - 15.5 %   Platelets 167  150 - 400 K/uL  PROTIME-INR     Status: None   Collection Time    03/06/14  7:51 AM      Result  Value Ref Range   Prothrombin Time 13.1  11.6 - 15.2 seconds   INR 0.99  0.00 - 1.49   No results found.  Review of Systems  Constitutional: Negative for fever and weight loss.  Respiratory: Negative for shortness of breath.   Cardiovascular: Negative for chest pain.  Gastrointestinal: Negative for nausea, vomiting and abdominal pain.  Neurological: Negative for weakness and headaches.  Psychiatric/Behavioral: Negative for substance abuse.    Blood pressure 134/71, pulse 50, temperature 97.8 F (36.6 C), temperature source Oral, resp. rate 18, height 5\' 11"  (1.803 m), weight 90.719 kg (200 lb), SpO2 97.00%. Physical Exam  Constitutional: He is oriented to person, place, and time. He appears well-nourished.  Cardiovascular: Normal rate and regular rhythm.   No murmur heard. Respiratory: Effort normal and breath sounds normal. He has no wheezes.  GI: Soft. Bowel sounds are normal. There is no tenderness.  Musculoskeletal: Normal range of motion.  Neurological: He is alert and oriented to person, place, and time.  Skin:  Skin is warm and dry.  Psychiatric: He has a normal mood and affect. His behavior is normal. Judgment and thought content normal.     Assessment/Plan Hx prostate ca New lung nodules noted at routine follow up Compared to 2007 CXR: some existing nodules larger PSA elevated Now scheduled for LLL lung mass bx Pt aware of procedure benefits and risks and agreeable to proceed Consent signed and in chart  [place] A 03/06/2014, 8:35 AM

## 2014-03-21 ENCOUNTER — Encounter: Payer: Self-pay | Admitting: *Deleted

## 2014-03-21 NOTE — [facility] Oncology Navigator Note (Unsigned)
Pt was discussed at thoracic cancer conference on 03/13/14.  I called [facility] to see if pt was being seen by medical oncology.  Office staff stated pt was seen by Dr. Risa Grill yesterday.  She will send him a message to see if he has been referred.

## 2014-04-01 ENCOUNTER — Encounter: Payer: Self-pay | Admitting: *Deleted

## 2014-04-01 NOTE — [facility] Oncology Navigator Note (Unsigned)
[facility] call me back.  Gwen from their office,  stated Mr. Vi is being seen by Dr. Valere Dross and if referral needs to be made, he will do so.

## 2014-04-01 NOTE — [facility] Oncology Navigator Note (Unsigned)
Called and spoke with [facility] regarding patient treatment.  Office staff will follow up with Dr. Risa Grill and let me know about treatment plan.

## 2014-04-13 ENCOUNTER — Other Ambulatory Visit: Payer: Self-pay | Admitting: Pulmonary Disease

## 2014-04-28 ENCOUNTER — Other Ambulatory Visit: Payer: Self-pay | Admitting: *Deleted

## 2014-04-28 ENCOUNTER — Ambulatory Visit (INDEPENDENT_AMBULATORY_CARE_PROVIDER_SITE_OTHER): Payer: Medicare Other | Admitting: Cardiology

## 2014-04-28 ENCOUNTER — Encounter: Payer: Self-pay | Admitting: Cardiology

## 2014-04-28 VITALS — BP 120/70 | Ht 71.0 in | Wt 205.0 lb

## 2014-04-28 DIAGNOSIS — I2510 Atherosclerotic heart disease of native coronary artery without angina pectoris: Secondary | ICD-10-CM

## 2014-04-28 NOTE — Patient Instructions (Signed)
Your physician recommends that you schedule a follow-up appointment in: one year with Dr. Hochrein  

## 2014-04-28 NOTE — Progress Notes (Signed)
HPI The patient presents for follow up.  Since I last saw him he has done well from a cardiovascular standpoint.  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  He is on hormone therapy. .  The patient denies any new symptoms such as chest discomfort, neck or arm discomfort. There has been no new shortness of breath, PND or orthopnea. There have been no reported palpitations, presyncope or syncope.  He does yard work without cardiovascular symptoms. .  Allergies  Allergen Reactions  . Simvastatin Other (See Comments)    REACTION: pt states INTOL to ZOCOR w/ leg pains    Current Outpatient Prescriptions  Medication Sig Dispense Refill  . ALPRAZolam (XANAX) 0.5 MG tablet TAKE1/2 TO 1 TABLET BY MOUTH THREE TIMES DAILY AS NEEDED ANXIETY  90 tablet  5  . aspirin 81 MG tablet Take 81 mg by mouth daily.      Marland Kitchen atenolol (TENORMIN) 25 MG tablet Take 1 tablet (25 mg total) by mouth daily.  30 tablet  6  . b complex vitamins tablet Take 1 tablet by mouth daily.      . bicalutamide (CASODEX) 50 MG tablet Take 1 tablet by mouth daily.      . Calcium-Magnesium-Vitamin D (CALCIUM 1200+D3 PO) Take 1 tablet by mouth daily.      . Cyanocobalamin (VITAMIN B-12 PO) Take 1 tablet by mouth daily.      Marland Kitchen GLUCOSAMINE SULFATE PO Take 1 tablet by mouth 2 (two) times daily.      Marland Kitchen lisinopril (PRINIVIL,ZESTRIL) 10 MG tablet Take 10 mg by mouth daily.      . Multiple Vitamins-Minerals (MULTIVITAMIN PO) Take 1 tablet by mouth daily.      . Omega-3 Fatty Acids (FISH OIL) 1000 MG CAPS Take 1,000 mg by mouth daily.      Marland Kitchen omeprazole (PRILOSEC) 40 MG capsule Take 1 capsule (40 mg total) by mouth daily.  30 capsule  6  . pravastatin (PRAVACHOL) 20 MG tablet Take 1 tablet (20 mg total) by mouth daily.  30 tablet  6   No current facility-administered medications for this visit.    Past Medical History  Diagnosis Date  . Recurrent aspiration bronchitis/pneumonia   .  Pulmonary nodule   . CAD (coronary artery disease)     anterior ischemia on a stress perfusion study.  (Catheterization in 2007, demonstrating 95% mid-LAD stenosis.  The circumflex has 20% proximal stenosis, the right coronary artery had 20% mid stenosis.  The EF was 60%.  He had a non drug eluting stent placed.    Marland Kitchen Unspecified essential hypertension   . Other and unspecified hyperlipidemia   . Esophageal reflux   . Esophageal stricture   . Diverticulosis of colon (without mention of hemorrhage)   . Malignant neoplasm of prostate   . Osteoarthrosis, unspecified whether generalized or localized, unspecified site   . Osteoarthrosis, unspecified whether generalized or localized, unspecified site     Past Surgical History  Procedure Laterality Date  . Eyelid surgery for lid lag    . Foot surgery    . Hydrocele excision / repair    . Lung nodules    . Mastectomy      benign tumor  . Inguinal hernia repair    . Prostate treatment      cancer  . Skin cancer excision  2012    skin cancer on right ear    ROS:  As stated in the  HPI and negative for all other systems.  PHYSICAL EXAM BP 120/70  Ht 5\' 11"  (1.803 m)  Wt 205 lb (92.987 kg)  BMI 28.60 kg/m2 GENERAL:  Well appearing HEENT:  Pupils equal round and reactive, fundi not visualized, oral mucosa unremarkable, dentures NECK:  No jugular venous distention, waveform within normal limits, carotid upstroke brisk and symmetric, no bruits, no thyromegaly LUNGS:  Clear to auscultation bilaterally BACK:  No CVA tenderness CHEST:  Unremarkable HEART:  PMI not displaced or sustained,S1 and S2 within normal limits, no S3, no S4, no clicks, no rubs, no murmurs ABD:  Flat, positive bowel sounds normal in frequency in pitch, no bruits, no rebound, no guarding, no midline pulsatile mass, no hepatomegaly, no splenomegaly EXT:  2 plus pulses throughout, no edema, no cyanosis no clubbing  EKG:  NSR, rate 56, LAD.  No acute ST T wave changes.   04/28/2014   ASSESSMENT AND PLAN   CAD -  The patient had a follow up stress test in 2011. Since that time he has had no new sypmtoms. No further cardiovascular testing is indicated. We will continue with aggressive risk reduction and meds as listed.   DYSLIPIDEMIA - His LDL this July was at target.  He will continue with current therapy.   HYPERTENSION - The blood pressure is at target. No change in medications is indicated. We will continue with therapeutic lifestyle changes (TLC).

## 2014-06-13 ENCOUNTER — Telehealth: Payer: Self-pay | Admitting: Pulmonary Disease

## 2014-06-13 NOTE — Telephone Encounter (Signed)
Called spoke with pt spouse. She reports pt feel about 2 weeks ago. Pt started having back pain since. Pt is now having hard time walking, standing. Asking for referral to ortho. Aware SN not in until Monday. Please advise SN thanks

## 2014-06-14 ENCOUNTER — Encounter (HOSPITAL_COMMUNITY): Payer: Self-pay | Admitting: Emergency Medicine

## 2014-06-14 ENCOUNTER — Emergency Department (HOSPITAL_COMMUNITY): Payer: Medicare Other

## 2014-06-14 ENCOUNTER — Emergency Department (HOSPITAL_COMMUNITY)
Admission: EM | Admit: 2014-06-14 | Discharge: 2014-06-14 | Disposition: A | Discharge status: Home / Self Care | Payer: Medicare Other | Attending: Emergency Medicine | Admitting: Emergency Medicine

## 2014-06-14 DIAGNOSIS — I10 Essential (primary) hypertension: Secondary | ICD-10-CM | POA: Diagnosis not present

## 2014-06-14 DIAGNOSIS — Z87891 Personal history of nicotine dependence: Secondary | ICD-10-CM | POA: Insufficient documentation

## 2014-06-14 DIAGNOSIS — K219 Gastro-esophageal reflux disease without esophagitis: Secondary | ICD-10-CM | POA: Insufficient documentation

## 2014-06-14 DIAGNOSIS — C61 Malignant neoplasm of prostate: Secondary | ICD-10-CM | POA: Insufficient documentation

## 2014-06-14 DIAGNOSIS — C7800 Secondary malignant neoplasm of unspecified lung: Secondary | ICD-10-CM | POA: Diagnosis not present

## 2014-06-14 DIAGNOSIS — M79604 Pain in right leg: Secondary | ICD-10-CM

## 2014-06-14 DIAGNOSIS — E785 Hyperlipidemia, unspecified: Secondary | ICD-10-CM | POA: Insufficient documentation

## 2014-06-14 DIAGNOSIS — Z8701 Personal history of pneumonia (recurrent): Secondary | ICD-10-CM | POA: Insufficient documentation

## 2014-06-14 DIAGNOSIS — Z85820 Personal history of malignant melanoma of skin: Secondary | ICD-10-CM | POA: Diagnosis not present

## 2014-06-14 DIAGNOSIS — Z7982 Long term (current) use of aspirin: Secondary | ICD-10-CM | POA: Diagnosis not present

## 2014-06-14 DIAGNOSIS — I2510 Atherosclerotic heart disease of native coronary artery without angina pectoris: Secondary | ICD-10-CM | POA: Diagnosis not present

## 2014-06-14 DIAGNOSIS — Z7952 Long term (current) use of systemic steroids: Secondary | ICD-10-CM | POA: Diagnosis not present

## 2014-06-14 DIAGNOSIS — Z79899 Other long term (current) drug therapy: Secondary | ICD-10-CM | POA: Insufficient documentation

## 2014-06-14 DIAGNOSIS — M1990 Unspecified osteoarthritis, unspecified site: Secondary | ICD-10-CM | POA: Diagnosis not present

## 2014-06-14 DIAGNOSIS — M5431 Sciatica, right side: Secondary | ICD-10-CM | POA: Diagnosis not present

## 2014-06-14 DIAGNOSIS — M549 Dorsalgia, unspecified: Secondary | ICD-10-CM | POA: Diagnosis not present

## 2014-06-14 MED ORDER — OXYCODONE-ACETAMINOPHEN 5-325 MG PO TABS: 1.0000 | ORAL_TABLET | ORAL | Status: DC | PRN

## 2014-06-14 MED ORDER — HYDROCODONE-ACETAMINOPHEN 5-325 MG PO TABS
1.0000 | ORAL_TABLET | Freq: Once | ORAL | Status: AC
  Administered 2014-06-14: 1 via ORAL
  Filled 2014-06-14: qty 1

## 2014-06-14 MED ORDER — PREDNISONE 20 MG PO TABS
60.0000 mg | ORAL_TABLET | Freq: Once | ORAL | Status: AC
  Administered 2014-06-14: 60 mg via ORAL
  Filled 2014-06-14: qty 3

## 2014-06-14 MED ORDER — PREDNISONE 20 MG PO TABS: ORAL_TABLET | ORAL | Status: DC

## 2014-06-14 NOTE — Progress Notes (Signed)
VASCULAR LAB PRELIMINARY  PRELIMINARY  PRELIMINARY  PRELIMINARY  Right lower extremity venous Doppler completed.    Preliminary report:  There is no DVT or SVT noted in the right lower extremity.   Maayan Jenning, RVT 06/14/2014, 1:19 PM

## 2014-06-14 NOTE — ED Notes (Addendum)
Pt from home via [facility] with c/o lower back pain and right thigh pain starting Tuesday, worsening Wed.  Pt reports being unable to ambulate however EMS states he insisted on walking down the steps from the home.  Denies injury to area, last fall was 3 weeks ago not associated with any complaints today.  Pt able to remove pants and move in bed with mild grimacing.  Pt reports receiving a steroid shot in right hip this past Monday "for a head cold and chest congestion." Pt in NAD, A&O.

## 2014-06-14 NOTE — ED Provider Notes (Signed)
CSN: [id number]     Arrival date & time 06/14/14  1001 History   First MD Initiated Contact with Patient 06/14/14 1027     Chief Complaint  Patient presents with  . Back Pain  . Leg Pain     (Consider location/radiation/quality/duration/timing/severity/associated sxs/prior Treatment) The history is provided by the patient. No language interpreter was used.  [name] is a 73 y/o M with PMHx of prostate cancer with metastasis to the lungs with identifying lumbar nodules, coronary artery disease, hypertension, hyperlipidemia presenting to the ED with right-sided back pain and right thigh pain that has been ongoing for approximately one week. Patient reports that he had a fall approximately 3 weeks ago when he slipped and fell on his right side-stated that he did not have any immediate pain. Reported that on Monday he was seen and assessed by his primary care provider who administered an IM injection of steroids in the right buttocks. Stated that on Tuesday he started to experience right sided back pain and right leg pain the progress he has gotten worse since Tuesday. Stated that there is a constant stinging sensation with extreme tenderness upon palpation to the surface of the skin. Reported that he has difficulty standing secondary to pain. Denied fever, chills, loss of sensation, urinary bowel incontinence, hematuria, sores, changes to skin color, rashes, leg swelling, travel, chest pain, shortness of breath, difficulty breathing. PCP Dr. Lenna Gilford  Past Medical History  Diagnosis Date  . Recurrent aspiration bronchitis/pneumonia   . Pulmonary nodule   . CAD (coronary artery disease)     anterior ischemia on a stress perfusion study.  (Catheterization in 2007, demonstrating 95% mid-LAD stenosis.  The circumflex has 20% proximal stenosis, the right coronary artery had 20% mid stenosis.  The EF was 60%.  He had a non drug eluting stent placed.    Marland Kitchen Unspecified essential hypertension   . Other  and unspecified hyperlipidemia   . Esophageal reflux   . Esophageal stricture   . Diverticulosis of colon (without mention of hemorrhage)   . Malignant neoplasm of prostate   . Osteoarthrosis, unspecified whether generalized or localized, unspecified site   . Osteoarthrosis, unspecified whether generalized or localized, unspecified site    Past Surgical History  Procedure Laterality Date  . Eyelid surgery for lid lag    . Foot surgery    . Hydrocele excision / repair    . Lung nodules    . Mastectomy      benign tumor  . Inguinal hernia repair    . Prostate treatment      cancer  . Skin cancer excision  2012    skin cancer on right ear   Family History  Problem Relation Age of Onset  . Prostate cancer Father   . Prostate cancer Brother   . Colon cancer Neg Hx    History  Substance Use Topics  . Smoking status: Former Smoker -- 1.00 packs/day for 18 years    Types: Cigarettes    Quit date: 04/24/1977  . Smokeless tobacco: Former Systems developer    Types: Snuff, Chew  . Alcohol Use: 12.6 oz/week    21 Cans of beer per week     Comment: 3 beers per day    Review of Systems  Constitutional: Negative for fever and chills.  Respiratory: Negative for chest tightness and shortness of breath.   Cardiovascular: Negative for chest pain.  Gastrointestinal: Negative for nausea, vomiting and abdominal pain.  Genitourinary: Negative for dysuria  and decreased urine volume.  Musculoskeletal: Positive for back pain (Right-sided back pain) and arthralgias (right hip and thigh). Negative for neck pain.  Neurological: Negative for weakness and numbness.      Allergies  Simvastatin  Home Medications   Prior to Admission medications   Medication Sig Start Date End Date Taking? Authorizing Provider  ALPRAZolam Duanne Moron) 0.5 MG tablet TAKE1/2 TO 1 TABLET BY MOUTH THREE TIMES DAILY AS NEEDED ANXIETY 04/21/14  Yes Noralee Space, MD  aspirin 81 MG tablet Take 81 mg by mouth daily.   Yes Historical  Provider, MD  atenolol (TENORMIN) 25 MG tablet Take 1 tablet (25 mg total) by mouth daily. 02/10/14  Yes Noralee Space, MD  b complex vitamins tablet Take 1 tablet by mouth daily.   Yes Historical Provider, MD  Calcium-Magnesium-Vitamin D (CALCIUM 1200+D3 PO) Take 1 tablet by mouth daily.   Yes Historical Provider, MD  cefdinir (OMNICEF) 300 MG capsule Take 300 mg by mouth 2 (two) times daily.   Yes Historical Provider, MD  Chlorphen-PE-Acetaminophen (NOREL AD) 4-10-325 MG TABS Take 1 tablet by mouth 3 (three) times daily.   Yes Historical Provider, MD  Cyanocobalamin (VITAMIN B-12 PO) Take 1 tablet by mouth daily.   Yes Historical Provider, MD  GLUCOSAMINE SULFATE PO Take 1 tablet by mouth 2 (two) times daily.   Yes Historical Provider, MD  lisinopril (PRINIVIL,ZESTRIL) 10 MG tablet Take 10 mg by mouth daily.   Yes Historical Provider, MD  Multiple Vitamins-Minerals (MULTIVITAMIN PO) Take 1 tablet by mouth daily.   Yes Historical Provider, MD  Omega-3 Fatty Acids (FISH OIL) 1000 MG CAPS Take 1,000 mg by mouth daily.   Yes Historical Provider, MD  omeprazole (PRILOSEC) 40 MG capsule Take 1 capsule (40 mg total) by mouth daily. 02/10/14  Yes Noralee Space, MD  pravastatin (PRAVACHOL) 20 MG tablet Take 1 tablet (20 mg total) by mouth daily. 02/10/14  Yes Noralee Space, MD  oxyCODONE-acetaminophen (PERCOCET/ROXICET) 5-325 MG per tablet Take 1-2 tablets by mouth every 4 (four) hours as needed for moderate pain or severe pain. 06/14/14   Charlesetta Shanks, MD  predniSONE (DELTASONE) 20 MG tablet 3 tabs po daily x 3 days, then 2 tabs x 3 days, then 1.5 tabs x 3 days, then 1 tab x 3 days, then 0.5 tabs x 3 days 06/14/14   Charlesetta Shanks, MD   BP 164/84 mmHg  Pulse 67  Temp(Src) 98.4 F (36.9 C) (Oral)  Resp 19  SpO2 98% Physical Exam  Constitutional: He is oriented to person, place, and time. He appears well-developed and well-nourished. No distress.  HENT:  Head: Normocephalic and atraumatic.    Mouth/Throat: Oropharynx is clear and moist. No oropharyngeal exudate.  Eyes: Conjunctivae and EOM are normal. Pupils are equal, round, and reactive to light. Right eye exhibits no discharge. Left eye exhibits no discharge.  Neck: Normal range of motion. Neck supple. No tracheal deviation present.  Cardiovascular: Normal rate, regular rhythm and normal heart sounds.  Exam reveals no friction rub.   No murmur heard. Pulmonary/Chest: Effort normal and breath sounds normal. No respiratory distress. He has no wheezes. He has no rales.  Abdominal: Soft. Bowel sounds are normal. He exhibits no distension. There is no tenderness. There is no rebound and no guarding.  Musculoskeletal: Normal range of motion. He exhibits tenderness. He exhibits no edema.       Right hip: He exhibits tenderness. He exhibits normal range of motion, normal strength, no  bony tenderness, no swelling and no crepitus.       Legs: Negative swelling, erythema, bulging, deformities, malalignment identified to the spine. Discomfort upon palpation to the right lower aspect of the lumbar spine-tenderness upon palpation. Discomfort upon palpation to the lateral aspect of the right thigh. Negative swelling, erythema, inflammation, lesions, sores, ecchymosis, deformities or malalignments identified to the right lower extremity. Full range of motion to the right hip and right knee without difficulty or ataxia. Negative crepitus upon palpation.  Lymphadenopathy:    He has no cervical adenopathy.  Neurological: He is alert and oriented to person, place, and time. No cranial nerve deficit. He exhibits normal muscle tone. Coordination normal.  Cranial nerves III-XII grossly intact Strength 5+/5+ to lower extremities bilaterally with resistance applied, equal distribution noted Sensation intact with differentiation sharp and dull touch Negative saddle paresthesias bilaterally Strength intact to to the digits of the toes bilaterally  Skin:  Skin is warm and dry. No rash noted. He is not diaphoretic. No erythema.  Negative lesions or sores identified to the right lower extremity and right side the back  Psychiatric: He has a normal mood and affect. His behavior is normal. Thought content normal.  Nursing note and vitals reviewed.   ED Course  Procedures (including critical care time) Labs Review Labs Reviewed - No data to display  Imaging Review Dg Lumbar Spine Complete  06/14/2014   CLINICAL DATA:  Low back and a right hip pain. History of prostate cancer.  EXAM: LUMBAR SPINE - COMPLETE 4+ VIEW  COMPARISON:  Abdomen and pelvis CT dated 02/14/2014.  FINDINGS: Five non-rib-bearing lumbar vertebrae. Mild dextroconvex lumbar scoliosis. Disc space narrowing and anterior and lateral spur formation throughout the lumbar spine. No fractures, pars defects or subluxations. No evidence of bony metastatic disease. Atheromatous arterial calcifications.  IMPRESSION: Extensive degenerative changes.  No acute abnormality.   Electronically Signed   By: Enrique Sack M.D.   On: 06/14/2014 13:25   Dg Hip Complete Right  06/14/2014   CLINICAL DATA:  Low back right hip pain. History of prostate cancer.  EXAM: RIGHT HIP - COMPLETE 2+ VIEW  COMPARISON:  Lumbar spine radiographs obtained at the same time. Pelvis CT dated 02/14/2014.  FINDINGS: Normal-appearing right hip. Minimal left hip degenerative changes. Prostate radiation seed implants. Lower lumbar spine degenerative changes.  IMPRESSION: No acute abnormality.  Minimal left hip degenerative changes.   Electronically Signed   By: Enrique Sack M.D.   On: 06/14/2014 13:27     EKG Interpretation None      MDM   Final diagnoses:  Back pain  Sciatica, right  Prostate cancer metastatic to lung    Medications  predniSONE (DELTASONE) tablet 60 mg (not administered)  HYDROcodone-acetaminophen (NORCO/VICODIN) 5-325 MG per tablet 1 tablet (not administered)  HYDROcodone-acetaminophen  (NORCO/VICODIN) 5-325 MG per tablet 1 tablet (1 tablet Oral Given 06/14/14 1347)    Filed Vitals:   06/14/14 1200 06/14/14 1253 06/14/14 1402 06/14/14 1403  BP: 142/82 150/82 164/84   Pulse: 63 59  67  Temp:      TempSrc:      Resp: 20 17 25 19   SpO2: 100% 97%  98%   This provider reviewed the patient's chart. Patient recently had a PET scan performed in August 2015 with negative metastasis to the bones. Patient also had a CT scan of the abdomen and pelvis with contrast performed in July 2015 with negative findings of aortic aneurysm, atherosclerosis noted. Preliminary results of Doppler of  right lower extremity negative for acute SVT or DVT. Plain film of lumbar spine negative for acute osseous injury. Plain film of right hip noted minimal left hip degenerative changes with negative acute abnormalities. Doubt dissection. Doubt AAA. Doubt cauda equina. Imaging reviewed with PET scan with negative findings of metastasis. Doubt herpes zoster. Suspicion to be sciatica pain. Imaging and doppler unremarkable for acute abnormalities. Pain medications given in the ED setting. Patient stable, afebrile. Patient not septic appearing. Negative focal neurological deficits. Pulses palpable and strong. Patient seen and assessed by attending physician, Dr. Jerilynn Mages. Pfeiffer, who agrees to plan of discharge for sciatica pain. Patient cleared for discharge by attending physician. Patient discharged with pain medications-discussed course, cautions, disposal technique. Referred patient to PCP and orthopedics. Discussed with patient to rest and stay hydrated. Discussed patient to avoid any physical shortness activity. Discussed with patient to closely monitor symptoms and if symptoms are to worsen or change to report back to the ED - strict return instructions given.  Patient agreed to plan of care, understood, all questions answered.   Jamse Mead, PA-C 06/14/14 1418  Charlesetta Shanks, MD 06/17/14 [id number]

## 2014-06-14 NOTE — Discharge Instructions (Signed)
Sciatica Sciatica is pain, weakness, numbness, or tingling along the path of the sciatic nerve. The nerve starts in the lower back and runs down the back of each leg. The nerve controls the muscles in the lower leg and in the back of the knee, while also providing sensation to the back of the thigh, lower leg, and the sole of your foot. Sciatica is a symptom of another medical condition. For instance, nerve damage or certain conditions, such as a herniated disk or bone spur on the spine, pinch or put pressure on the sciatic nerve. This causes the pain, weakness, or other sensations normally associated with sciatica. Generally, sciatica only affects one side of the body. CAUSES   Herniated or slipped disc.  Degenerative disk disease.  A pain disorder involving the narrow muscle in the buttocks (piriformis syndrome).  Pelvic injury or fracture.  Pregnancy.  Tumor (rare). SYMPTOMS  Symptoms can vary from mild to very severe. The symptoms usually travel from the low back to the buttocks and down the back of the leg. Symptoms can include:  Mild tingling or dull aches in the lower back, leg, or hip.  Numbness in the back of the calf or sole of the foot.  Burning sensations in the lower back, leg, or hip.  Sharp pains in the lower back, leg, or hip.  Leg weakness.  Severe back pain inhibiting movement. These symptoms may get worse with coughing, sneezing, laughing, or prolonged sitting or standing. Also, being overweight may worsen symptoms. DIAGNOSIS  Your caregiver will perform a physical exam to look for common symptoms of sciatica. He or she may ask you to do certain movements or activities that would trigger sciatic nerve pain. Other tests may be performed to find the cause of the sciatica. These may include:  Blood tests.  X-rays.  Imaging tests, such as an MRI or CT scan. TREATMENT  Treatment is directed at the cause of the sciatic pain. Sometimes, treatment is not necessary  and the pain and discomfort goes away on its own. If treatment is needed, your caregiver may suggest:  Over-the-counter medicines to relieve pain.  Prescription medicines, such as anti-inflammatory medicine, muscle relaxants, or narcotics.  Applying heat or ice to the painful area.  Steroid injections to lessen pain, irritation, and inflammation around the nerve.  Reducing activity during periods of pain.  Exercising and stretching to strengthen your abdomen and improve flexibility of your spine. Your caregiver may suggest losing weight if the extra weight makes the back pain worse.  Physical therapy.  Surgery to eliminate what is pressing or pinching the nerve, such as a bone spur or part of a herniated disk. HOME CARE INSTRUCTIONS   Only take over-the-counter or prescription medicines for pain or discomfort as directed by your caregiver.  Apply ice to the affected area for 20 minutes, 3-4 times a day for the first 48-72 hours. Then try heat in the same way.  Exercise, stretch, or perform your usual activities if these do not aggravate your pain.  Attend physical therapy sessions as directed by your caregiver.  Keep all follow-up appointments as directed by your caregiver.  Do not wear high heels or shoes that do not provide proper support.  Check your mattress to see if it is too soft. A firm mattress may lessen your pain and discomfort. SEEK IMMEDIATE MEDICAL CARE IF:   You lose control of your bowel or bladder (incontinence).  You have increasing weakness in the lower back, pelvis, buttocks,   or legs.  You have redness or swelling of your back.  You have a burning sensation when you urinate.  You have pain that gets worse when you lie down or awakens you at night.  Your pain is worse than you have experienced in the past.  Your pain is lasting longer than 4 weeks.  You are suddenly losing weight without reason. MAKE SURE YOU:  Understand these  instructions.  Will watch your condition.  Will get help right away if you are not doing well or get worse. Document Released: 06/28/2001 Document Revised: 01/03/2012 Document Reviewed: 11/13/2011 [employer] Patient Information 2015 [employer]. This information is not intended to replace advice given to you by your health care provider. Make sure you discuss any questions you have with your health care provider.  

## 2014-06-18 ENCOUNTER — Ambulatory Visit (INDEPENDENT_AMBULATORY_CARE_PROVIDER_SITE_OTHER): Payer: Medicare Other | Admitting: Pulmonary Disease

## 2014-06-18 ENCOUNTER — Ambulatory Visit (INDEPENDENT_AMBULATORY_CARE_PROVIDER_SITE_OTHER)
Admission: RE | Admit: 2014-06-18 | Discharge: 2014-06-18 | Disposition: A | Discharge status: Home / Self Care | Payer: Medicare Other | Source: Ambulatory Visit | Attending: Pulmonary Disease | Admitting: Pulmonary Disease

## 2014-06-18 ENCOUNTER — Encounter: Payer: Self-pay | Admitting: Pulmonary Disease

## 2014-06-18 VITALS — BP 140/90 | HR 67 | Temp 98.0°F | Ht 70.5 in | Wt 202.4 lb

## 2014-06-18 DIAGNOSIS — J410 Simple chronic bronchitis: Secondary | ICD-10-CM

## 2014-06-18 DIAGNOSIS — I2510 Atherosclerotic heart disease of native coronary artery without angina pectoris: Secondary | ICD-10-CM

## 2014-06-18 DIAGNOSIS — M159 Polyosteoarthritis, unspecified: Secondary | ICD-10-CM

## 2014-06-18 DIAGNOSIS — E785 Hyperlipidemia, unspecified: Secondary | ICD-10-CM

## 2014-06-18 DIAGNOSIS — I10 Essential (primary) hypertension: Secondary | ICD-10-CM

## 2014-06-18 DIAGNOSIS — M150 Primary generalized (osteo)arthritis: Secondary | ICD-10-CM

## 2014-06-18 DIAGNOSIS — M8949 Other hypertrophic osteoarthropathy, multiple sites: Secondary | ICD-10-CM

## 2014-06-18 DIAGNOSIS — M79604 Pain in right leg: Secondary | ICD-10-CM

## 2014-06-18 DIAGNOSIS — C7800 Secondary malignant neoplasm of unspecified lung: Secondary | ICD-10-CM

## 2014-06-18 DIAGNOSIS — C61 Malignant neoplasm of prostate: Secondary | ICD-10-CM

## 2014-06-18 MED ORDER — GABAPENTIN 100 MG PO CAPS: 100.0000 mg | ORAL_CAPSULE | Freq: Three times a day (TID) | ORAL | Status: DC

## 2014-06-18 MED ORDER — OXYCODONE-ACETAMINOPHEN 5-325 MG PO TABS: 1.0000 | ORAL_TABLET | Freq: Three times a day (TID) | ORAL | Status: DC | PRN

## 2014-06-18 MED ORDER — ATENOLOL 25 MG PO TABS: 25.0000 mg | ORAL_TABLET | Freq: Every day | ORAL | Status: DC

## 2014-06-18 NOTE — Patient Instructions (Signed)
Today we updated your med list in our EPIC system...    Continue your current medications the same...  We refilled your Oxycodone for pain- you may take up to 3 per day...    (be sure to counteract any constipation risk by taking MIRALAX- one capful of the powder mixed in water or juice daily, AND SENAKOT-s one or two caps at bedtime)...  We wrote for an new prescription for GABAPENTIN 100mg  to take one tab three times daily for the right leg nerve pain...    Call & let me know how this is working to see if we need to increase the dose...  We will arrange for an ORTHO eval of your right leg pain ASAP...  Today we did a follow up CXR...    We will contact you w/ the results when available...   Let's plan a follow up visit in about 6 wks, sooner if needed for problems.Marland KitchenMarland Kitchen

## 2014-06-18 NOTE — Progress Notes (Signed)
Subjective:     Patient ID: [name], male   DOB: 15-Feb-1941, 73 y.o.   MRN: [id number]  HPI 73 y/o WM here for a follow up visit... he has multiple medical problems as noted below...    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 he denies CP, palpit, change in SOB (he has chr stable DOE), cough, phlegm, edema, etc... BP controlled on meds;  no angina;  he saw GI 5/11 w/ dysphagia- EGD 5/11 by DrPatterson showed recurrent stricture, dilated #39F maloney, 3cmHH, & changed to AHolcomb268md... we discussed checking FASTING labs today, and adding Alprazolam Prn fr all the stress he is under.  CXR 2/11 showed norm heart size, clear lungs w/o nodules appreciated, NAD...   ~  February 05, 2013:  3y63yrV>  VanMyquanturns after a long hiatus- being cared for by DrHClear Channel Communicationsr Cards, & DrGrapey for Urology;  He has numerous medical issues- addressed below, but returns asking for a nerve pill describing lots of stress w/ his business etc; we discussed trial Alpraz0.5mg65md as needed...     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    HBP> on Aten25, Lisin10;  BP= 142/78 & wt=199# (same as 2011); he denies CP, palpit, SOB, dizzy, edema, etc...    CAD> rec to take ASA81; followed by DrHochrein & last seen 10/13- stable, no changes made...    Dyslipidemia> on Prav20;  FLP 6/14 showed TChol 169, TG 242, HDL 31, LDL 93 & we reviewed diet, exercise, & rec adding FENOFIBRATE 160mg69m.     GI- GERD, Divertics> on Omep40;  He denies abd pain, dysphagia, n/v, c/d, blood seen; last colon by DrPatterson was 2004 & he is due for f/u procedure...     Hx Prostate Cancer> followed by DrGrapey but pt missed his last appt & is due now- Hx prostate ca 2007 w/ IMRT &  hormone therapy x2yrs;45yrwly rising PSA per Urology but Lab 6/14 showed PSA= 3.21    DJD> on OTC analgesics as needed; he has seen DrDaldorf in the past for left knee pain...    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 We reviewed prob list, meds, xrays and labs> see below for updates >>   LABS 6/14:  FLP- chol ok but TG=242 HDL=31;  Chems- wnl;  CBC- wnl;  TSH=1.28;  PSA=3.21 (prev in 2011 it was 0.57)...  ~  August 13, 2013:  46mo ROV23moance has gained 9# up to 208# today, he blames it on quitting chewing tobacco but I told him the trade-off was worth it, asked to get on diet & incr exercise... He has no new complaints or concerns...    Breathing is stable but needs to incr exercise program...    BP is controlled w/ Aten25 & Lisin10;  BP=128/78 today & he denies CP, palpit, SOB, edema...    He saw DrHochrein 10/14- on above + ASA; stable & asymptomatic, no changes made, f/u 73yr...  44yrpids treated w/ Prav20 & diet but he's gained wt as noted; intol to Fenofib previously- we reviewed diet, exercise, wt reduction strategies...Marland KitchenMarland Kitchen  He saw DrGrapey for prostate Ca follow up 8/14> treated w/ XRT but slowly rising PSA since then, they are on Observation protocol w/ check ups every 9mo.. We reviewed prob list, meds, xrays and labs> see below for updates >> Given PREVNAR-13 & Rx for Shingles vaccine...   EKG 10/14 by DrHochrein showed SBrady, rate52, borderline EKG, NAD...   ADDENDUM> PSA 2/15 by DrGrapey was 4.26...   ~  February 10, 2014:  659moOV and VaForestndicates that he is doing well, no new complaints or concerns;  He has a rising PSA- followed by DrGrapey Q6m61mo PSADT est ~83m592moast measured 4.26 in Feb2015;  He saw Urology 3/15 & note reviewed (prev hx well outlined) and plan was to continue monitoring Q92mo 592mol PSA ~10 then consider hormone therapy...  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    HBP> on Aten25, Lisin10;  BP= 132/82 & wt=203# (down 5# last 92mo);8modenies CP, palpit, SOB, dizzy, edema, etc...    CAD> on ASA81; followed by DrHochrein & last seen 10/14- remains stable, no changes made, f/u 9yr...77yrDyslipidemia> on Prav20; hx intol to Fenofibrate; FLP 6/14 showed TChol 169, TG 242, HDL 31, LDL 93 & we reviewed diet, exercise, & wt reduction...    GI- GERD, Divertics> on Omep40;  He denies abd pain, dysphagia, n/v, c/d, blood seen; last colon by DrPatterson was 8/14 & showed mod divertics, no polyps, and he rec f/u colon for routine risk ~64yrs  39yrProstate Cancer> followed by DrGrapey w/ hx prostate ca 2007 w/ IMRT & hormone therapy x2yrs; he22yr had slowly rising PSA= 3.21 June2014,[id number]= 4.26 Feb2015, [id number] PSA= 6.15 (SEE BELOW)    DJD> on OTC analgesics as needed; he has seen DrDaldorf in the past for left knee pain...    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 We reviewed prob list, meds, xrays and labs> see below for updates >>   CXR 7/15 shows norm heart size & Ao atherosclerosis, bilat pulm nodules are visualized (new since 2/11 CXR) w/ largest ~2cm in LUL...    LABS 7/15:  FLP- not at goals w/ TG elev & HDL low;  Chems- ok x BS=135;  CBC- wnl;  TSH=1.38;  PSA=6.15...  CT Chest/ Abd/ Pelvis 7/15 showed numerous bilat pulm nodules; no signif findings in abd/pelvis x gallstones, adrenal adenoma, scat divertics, and atherosclerotic changes; degen  changes in spine PLAN>  CXR shows recurrent lung nodules (assoc w/ his rising PSA); prev noted nodules from 2007 were assoc w/ PSA of 10.6 at time of Dx, and the CXR cleared w/o nodules seen in 2008, 2009, 2011; he has been followed by Urology w/ slowly rising PSA at 3.1 last yr and 4.26 earlier this yr, and now=6.15   With f/u CXR showing recurrent mult bilat pulm nodules (also seen at time of Prostate cancer Dx in 2007 & resolved (2008, 2009, 2011) after XRT &  hormone therapy at time of dx...   PET Scan 02/20/14 showed numerous bilat noncalcif pulm nodules, 5 measured w/ max SUV 1.6-4.6, coronary atherosclerosis, no skeletal metssmall right adrenal nodule (adenma favored)...  Nuclear Bone Scan 02/27/14 showed focal areas of uptake in spine (most likely degenerative) & knee/ shoulder- no convincing evid for mets to bone...  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  PLAN>  He has upcoming appt w/ DrGrapey for Urology   ~  June 18, 2014:  125moROV & VSidharthsaw DrGrapey 9/15 who agreed w/ our Dx of an unusual case of Prostate Cancer w/ mult lung metastasis; they started Rx w/ Lupron (planning shots Q457moand used Casodex for the 1st 30d only; he has f/u appt in Jan2016 w/ Urology for PSA & Testos levels; by his account he has been tolerating the Rx well w/o new issues until last week...  He states that on 11/23 he had a "head cold & chest congestion" so he went to the local [facility] & was evaluated & given Omnicef & a steroid shot in right buttock; on 11/24 he awoke w/ pain in his right leg (?hip area & thigh area mostly) & states he "couldn't walk" due to the pain & numbness; on 11/25 he went to his Chiro w/ XRays done (later told they showed "alot of issues in my lower back"); he has an inversion table he's used on & off for yrs but hasn't been on it recently; 11/27 was Thanksgiving & on 11/28 he  was [facility] so much [facility] XRays of Lumbar sp w/ extensive degen changes, XRay of right hip w/ min degen arthritis, VenDopplers neg for DVT, no rash presenty, sl tender right hip but good ROM> they told him likely siatica- given Pred taper & Percocet5 which he has taken ~3/d & today notes sl improved...  Still no rash present & exam is unchanged from what is described- min tender, good ROM, essent neg SLR, symmetric reflexes and strength; note- [facility]...  Daughter requests that he see DrLucey for Ortho eval & we will try to expedite this ASAP for pt...     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     Prostate Cancer w/ lung mets> 9/15 OV by DrGrapey reviewed; pt was started on Lupron shots (planned Q4mo48mogiven Casodex x30d to start; pt has f/u visit sched for Jan2LFY1017 labs (PSA & Testos) and 2nd Lupron injection... We reviewed prob list, meds, xrays and labs> see below for updates >>   CXR 12/15 showed signif improvement w/ decr in size & number of nodules... PLAN>> now w/ new onset of right leg pain- primarily right thigh area w/ some buring as well; no rash present & asked to watch for vesicular eruption& call if any changes; this is the vicinity of meralgia paresthetica & we discussed trial GABAPENTIN start 100mg68m; continue Pred taper given in ER and refilled the Oxycodone5 Tid + constip prophylactic regime w/ Miralax daily & Senakot-S Qhs... Plan ROV 6wks...          Problem List:    BRONCHITIS, RECURRENT (ICD-491.9) - no recent URI symptoms... Hx of PULMONARY NODULES (ICD-518.89) - mult sm pulm nodules (?etiology- poss granulomas) on prev  Chest CT scans... ~  CXR 2007 when coronary stent placed showed ?sm nodules seen... ~  CT Chest 8/07 showed bilat noncalcif lung nodules, varying size in all lobes & largest= 9.34m in  LLL, no adenopathy... note: PSA=10.6 ~  CT Abd&Pelvis 9/07 showed mult sm lung nodules at bases bilat, gallstones, sm duod divertic, scat atherosclerotic calcif, no mass or adenop, asymmetric enlarged prostate...  ~  CT Chest 12/07 showed mult pulm nodules bilat- no change, no adenopathy, coronary calcif...  ~  f/u CXR 8/08 without nodules seen... (note: PSA= 0.03) ~  f/u CXR 11/09 clear, no lesions seen... ~  f/u CXR 2/11 showed clear, NAD..Marland Kitchen (note: PSA= 0.57) ~  CXR 7/15 shows norm heart size & Ao atherosclerosis, bilat pulm nodules are visualized (new since 2/11 CXR) w/ largest ~2cm in LUL... 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 ~  CXR 12/15 (after Lupron shot & 172mof Casodex) showed signif improvement w/ decr in size & number of nodules  HYPERTENSION (ICD-401.9) - contolled on ATENOLOL 2587m, & LISINOPRIL 56m28m..  ~  9/11:  BP=110/62 here and usually 120s/ 70s at home- denies HA, fatigue, visual changes, CP, palipit, dizziness, syncope, dyspnea, edema, etc... ~  7/14:  on Aten25, Lisin10;  BP= 142/78 & wt=199# (same as 2011); he denies CP, palpit, SOB, dizzy, edema, etc. ~  7/15: on Aten25, Lisin10;  BP= 132/82 & wt=203# (down 5# last 32mo)64mo remains asymptomatic... ~  12/15: on Aten25, Lisin10; BP= 140/90 & wt=202#; he denies CP, palpit, SOB, edema...  CAD (ICD-414.00) - followed by DrHochrein... on ASA 325mg/89m  NuclearStressTest 8/07 abnormal w/ ant ischemia... ~  cath 8/07 showed 95% mid-LAD stenosis, & 20% lesions in the other 2 vessels, EF=60%... subseq PTCA w/ non-drug eluting stent... ~  OV w/ Cards= 5/10 & note reviewed- BP sl elevated & Lisinopril added. ~  He saw DrHochrein 10/13> doing well, no new symptoms, exam was neg, no change in meds & rec aggressive risk factor reduction strategy...  ~  EKG 10/13 showed NSR, rate62, LAD, otherw wnl, NAD... ~  He saw  DrHochrein 10/14- on above + ASA; stable & asymptomatic, no changes made, f/u 29yr ~ 56yrsaw DrHochrein 10/15> HBP, CAD, HL- stable w/o CP/ palpit/ SOB/ edema/ etc (despite being diagnosed w/ met prostate ca to lungs)...  DYSLIPIDEMIA (ICD-272.4) - on PRAVACHOL 20mg/d 52mtol OK...  ~  FLP 8/08Princess Anneowed TChol 168, TG 143, HDL34, LDL105 ~  FLP 2/09 showed TChol 163, TG 207, HDL 34, LDL 91 ~  FLP 5/10 showed TChol 148, TG 131, HDL 33, LDL 89 ~  FLP 9/11 showed TChol 163, TG 140, HDL 42, LDL 93 ~  FLP 6/14 on Prav20 showed TChol 169, TG 242, HDL 31, LDL 93  ~  FLP 7/15 on Prav20 showed TChol 174, TG 290, HDL 31, LDL 87... We reviewed low fat diet 7 need for wt reduction.  GERD (ICD-530.81) - prev on OMEP20 but causes diarrhea he says, and ACIPHEX 20mg/d w53m better...  ~  last EGD (DrPatterson)was 8/04 showing GERD & stricture dilated... ~  5/11: presented to GI w/ recurrent dysphagia- EGD showed 3cmHH, stricture dilated, no Barrett's, changed to Aciphex. ~  On Omep40 & he remains asymptomatic w/o dysphagia, CP, abd pain, n/v, c/d, blood seen...  DIVERTICULOSIS OF COLON (ICD-562.10) >>  ~  Colonoscopy  8/04 by DrPatterson was normal, f/u 90yr (scattered tics seen on 1996 flex) ~  7/14:  He is due for f/u colon 7 we will refer the chart to DrPatterson for review... ~  Colonoscopy 8/14 by DrPatterson showed mod divertics, no polyps, and he rec f/u colon for routine risk ~166yr..  PROSTATE CANCER (ICD-185) - followed by DrPeterson- elevated PSA found 8/07 (10.6)... biopsies were pos (Stage T2b, bilat dis w/ Gleason4+3=7 on one side & 8 on the other) & there is a family hx as well; he had bilat pulm nodules on CXR & CT Chest at diagnosis... [facility], then hormonal Rx for 2 yrs... [facility]... prev on TRELSTAR injections q 55m60month PROVERA for the hot flashes>> x2yr65yrneg bone scan 1/08;  PSA here= 0.03 in 2008, and CXR cleared w/o nodules  seen in 2008, 2009, 2011 (see above)... ~  2/11: we don't have recent note from DrPe[place] will request info sent to us aKoreanext visit. ~  9/11: labs here showed PSA= 0.57 & we will forward to DrPeHorseheads North9/13:  He had f/u visit w/ DrGrapey> PSA had been slowly rising and ~1 when they last checked; DrGrapey rec Q19mo 319mo ~  7/14:  Pt missed his 3/14 appt w/ Urology & is sched to see them 8/14; PSA recorded 6/14 = 3.21 & we sent copy to DrGrapey... ~  3/15:  He had f/u DrGrapey> prostate Ca dx 2007, [facility]21yrMRT in 2008; slowly rising PSA w/ doubling time ~319mo n17morGrapey follows pt Q19mo & p855mois for hormone therapy when PSA>10... ~  7/15:  Routine CXR showed return of bilat pulm nodules assoc w/ PSA= 6.15  (CT Chest, Abd, Pelvis is planned & discussion w/ DrGrapey & Oncology)=> Dx w/ metastatic prostate adenocarcinoma (lung mets) ~  9/15: he was started on Lupron shots Q4mo, giv68moasodex daily x30d per DrGrapey; f/u OV planned Jan2016..VQQ5956/15: CXR showed improvement w/ decr in size & number of lung nodules...  DEGENERATIVE JOINT DISEASE (ICD-715.90) >>  RIGHT THIGH AREA PAIN 12/15 >> ?etiology, [facility] & [facility]... ~  his left knee gives him some pain on and off; he saw DrDaldorf for this 12/08 & he thought there might be a torn meniscus, they decided to Rx w/ NAPROSEN which helps; may yet need MRI/ arthroscopy... ~  2/11: discussed trial of Mobic to see if this works for him. ~  12/15: presented w/ right thigh area pain ?[facility]; [employer]/ extensive degen changes in lumbar spine; sl improved w/ Pred & Percocet from [facility]; [place], ?could this be meralgia paresthetica? They want to see DrLucey for Ortho eval, we will give trial GABAPENTIN100Tid, watch for rash (he had Shingles vaccine 2 mo ago)...  HEALTH  MAINTENANCE >>  ~  GI>  Followed by DrPattersLongs Drug Storesolon was 2004- f/u due now... ~  GU>  Followed by DrGrapey & seen every 19mo for P20moecheck but he missed the 3/14 appt & our PSA 6/14= 3.21 (copy to Urology & he has appt 8/14)... ~  Immuniz>  He gets the yearly flu vax (last 10/13);  Had Pneumovax several yrs ago;  Not sure of last Tetanus vaccine;  Asking about the shingles shot=> Rx written &  reveived [facility]...   Past Surgical History  Procedure Laterality Date  . Eyelid surgery for lid lag    . Foot surgery    . Hydrocele excision / repair    . Lung nodules    . Mastectomy      benign tumor  . Inguinal hernia repair    . Prostate treatment      cancer  . Skin cancer excision  2012    skin cancer on right ear    Outpatient Encounter Prescriptions as of 06/18/2014  Medication Sig  . ALPRAZolam (XANAX) 0.5 MG tablet TAKE1/2 TO 1 TABLET BY MOUTH THREE TIMES DAILY AS NEEDED ANXIETY  . aspirin 81 MG tablet Take 81 mg by mouth daily.  Marland Kitchen atenolol (TENORMIN) 25 MG tablet Take 1 tablet (25 mg total) by mouth daily.  Marland Kitchen b complex vitamins tablet Take 1 tablet by mouth daily.  . Calcium-Magnesium-Vitamin D (CALCIUM 1200+D3 PO) Take 1 tablet by mouth daily.  . cefdinir (OMNICEF) 300 MG capsule Take 300 mg by mouth 2 (two) times daily.  . Chlorphen-PE-Acetaminophen (NOREL AD) 4-10-325 MG TABS Take 1 tablet by mouth 3 (three) times daily.  . Cyanocobalamin (VITAMIN B-12 PO) Take 1 tablet by mouth daily.  Marland Kitchen GLUCOSAMINE SULFATE PO Take 1 tablet by mouth 2 (two) times daily.  Marland Kitchen lisinopril (PRINIVIL,ZESTRIL) 10 MG tablet Take 10 mg by mouth daily.  . Multiple Vitamins-Minerals (MULTIVITAMIN PO) Take 1 tablet by mouth daily.  . Omega-3 Fatty Acids (FISH OIL) 1000 MG CAPS Take 1,000 mg by mouth daily.  Marland Kitchen omeprazole (PRILOSEC) 40 MG capsule Take 1 capsule (40 mg total) by mouth daily.  Marland Kitchen oxyCODONE-acetaminophen (PERCOCET/ROXICET) 5-325 MG per tablet Take 1-2 tablets by mouth  3 (three) times daily as needed for moderate pain or severe pain.  . pravastatin (PRAVACHOL) 20 MG tablet Take 1 tablet (20 mg total) by mouth daily.  . predniSONE (DELTASONE) 20 MG tablet 3 tabs po daily x 3 days, then 2 tabs x 3 days, then 1.5 tabs x 3 days, then 1 tab x 3 days, then 0.5 tabs x 3 days  . [DISCONTINUED] atenolol (TENORMIN) 25 MG tablet Take 1 tablet (25 mg total) by mouth daily.  . [DISCONTINUED] oxyCODONE-acetaminophen (PERCOCET/ROXICET) 5-325 MG per tablet Take 1-2 tablets by mouth every 4 (four) hours as needed for moderate pain or severe pain.  Marland Kitchen gabapentin (NEURONTIN) 100 MG capsule Take 1 capsule (100 mg total) by mouth 3 (three) times daily.    Allergies  Allergen Reactions  . Simvastatin Other (See Comments)    REACTION: pt states INTOL to ZOCOR w/ leg pains    Current Medications, Allergies, Past Medical History, Past Surgical History, Family History, and Social History were reviewed in [employer] record.   Review of Systems         See HPI - The patient denies anorexia, fever, weight loss, weight gain, vision loss, decreased hearing, hoarseness, chest pain, syncope, dyspnea on exertion, peripheral edema, prolonged cough, headaches, hemoptysis, abdominal pain, melena, hematochezia, severe indigestion/heartburn, hematuria, incontinence, muscle weakness, suspicious skin lesions, transient blindness, difficulty walking, depression, unusual weight change, abnormal bleeding, enlarged lymph nodes, and angioedema.     Objective:   Physical Exam    WD, WN, 73 y/o WM in NAD... GENERAL:  Alert & oriented; pleasant & cooperative... HEENT:  [place]/AT, EOM-wnl, PERRLA, EACs-clear, TMs-wnl, NOSE-clear, THROAT-clear & wnl. NECK:  Supple w/ fairROM; no JVD; normal carotid impulses w/o bruits; no thyromegaly or  nodules palpated; no lymphadenopathy. CHEST:  Clear to P & A; without wheezes/ rales/ or rhonchi heard... HEART:  Regular Rhythm; without murmurs/  rubs/ or gallops detected... ABDOMEN:  Soft & nontender; normal bowel sounds; no organomegaly or masses palpated... EXT: without deformities, mild arthritic changes; no varicose veins/ venous insuffic/ or edema.      c/o pain in right thigh area- good ROM hip, neg SLR, reflexes symmetric, no rash apparent... NEURO:  CN's intact; motor testing normal; sensory testing normal; gait normal & balance OK. DERM:  No lesions noted; no rash etc...  RADIOLOGY DATA:  Reviewed in the EPIC EMR & discussed w/ the patient...  LABORATORY DATA:  Reviewed in the EPIC EMR & discussed w/ the patient...   Assessment:      Hx Bronchitis, and Mult Pulm Nodules>  His breathing has been fine, at baseline w/o new complaints etc;  Previous mult pulm nodules from 2007 were likely prostate ca mets that resolved w/ his treatment IMRT 2008 and hormone therapy x79yr;  CXRs in 2008, 2009, & 2011 were clear;  CXR 7/15 showed return of bilat pulm nodules (assoc w/ rising PSA=6.15) & subseq CT Chest, PET scan, Bone scan, Needle bx lung lesion confirmed Dx metastatic prostate cancer in lung. 9/15> DrGrapey started treatment w/ Lupron Q421moCasodex x30d at outset... 12/15> CXR already shows signs of improvement w/ decr size & number of lesions...  HBP>  Stable on low dose Aten & Lisin, continue same...  CAD>  Followed by DrHochrein; seen last 10/14 & he remains stable, no changes made; needs to incr his exercise program...  Dyslipidemia>  On Prav20 but TG & HDL deranged; needs better diet, exercise, & wt reduction...  GI- GERD, Diverics, needs f/u colon>  F/u colon 8/14 was neg x divertics, no polyps, f/u rec for 1031yr.  Prostate Ca>  Managed by DrGrapey as noted; rising PSA indicates recurrent dis and mult lung nodules represent metastatic dis...  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  Anxiety>  He is requesting Rx- try ALPRAZOLAM 0.5mg51md as needed...      Plan:     Patient's Medications   New Prescriptions   GABAPENTIN (NEURONTIN) 100 MG CAPSULE    Take 1 capsule (100 mg total) by mouth 3 (three) times daily.  Previous Medications   ALPRAZOLAM (XANAX) 0.5 MG TABLET    TAKE1/2 TO 1 TABLET BY MOUTH THREE TIMES DAILY AS NEEDED ANXIETY   ASPIRIN 81 MG TABLET    Take 81 mg by mouth daily.   B COMPLEX VITAMINS TABLET    Take 1 tablet by mouth daily.   CALCIUM-MAGNESIUM-VITAMIN D (CALCIUM 1200+D3 PO)    Take 1 tablet by mouth daily.   CEFDINIR (OMNICEF) 300 MG CAPSULE    Take 300 mg by mouth 2 (two) times daily.   CHLORPHEN-PE-ACETAMINOPHEN (NOREL AD) 4-10-325 MG TABS    Take 1 tablet by mouth 3 (three) times daily.   CYANOCOBALAMIN (VITAMIN B-12 PO)    Take 1 tablet by mouth daily.   GLUCOSAMINE SULFATE PO    Take 1 tablet by mouth 2 (two) times daily.   LISINOPRIL (PRINIVIL,ZESTRIL) 10 MG TABLET    Take 10 mg by mouth daily.   MULTIPLE VITAMINS-MINERALS (MULTIVITAMIN PO)    Take 1 tablet by mouth daily.   OMEGA-3 FATTY ACIDS (FISH OIL) 1000 MG CAPS    Take 1,000 mg by mouth daily.   OMEPRAZOLE (PRILOSEC) 40 MG CAPSULE    Take  1 capsule (40 mg total) by mouth daily.   PRAVASTATIN (PRAVACHOL) 20 MG TABLET    Take 1 tablet (20 mg total) by mouth daily.   PREDNISONE (DELTASONE) 20 MG TABLET    3 tabs po daily x 3 days, then 2 tabs x 3 days, then 1.5 tabs x 3 days, then 1 tab x 3 days, then 0.5 tabs x 3 days  Modified Medications   Modified Medication Previous Medication   ATENOLOL (TENORMIN) 25 MG TABLET atenolol (TENORMIN) 25 MG tablet      Take 1 tablet (25 mg total) by mouth daily.    Take 1 tablet (25 mg total) by mouth daily.   OXYCODONE-ACETAMINOPHEN (PERCOCET/ROXICET) 5-325 MG PER TABLET oxyCODONE-acetaminophen (PERCOCET/ROXICET) 5-325 MG per tablet      Take 1-2 tablets by mouth 3 (three) times daily as needed for moderate pain or severe pain.    Take 1-2 tablets by mouth every 4 (four) hours as needed for moderate pain or severe pain.  Discontinued Medications   No  medications on file

## 2014-06-18 NOTE — Telephone Encounter (Signed)
Pt has appt today with SN.  Will discuss at this OV

## 2014-06-19 ENCOUNTER — Telehealth: Payer: Self-pay | Admitting: Pulmonary Disease

## 2014-06-19 NOTE — Telephone Encounter (Signed)
Pt returning call.[name] ° °

## 2014-06-19 NOTE — Telephone Encounter (Signed)
Notes Recorded by Noralee Space, MD on 06/19/2014 at 7:57 AM Please notify patient>  CXR shows improvement- decr in size & number of nodules c/w response to therapy (Lupron & Casodex)... This is oviously a good sign...  lmomtcb x 1

## 2014-06-19 NOTE — Telephone Encounter (Signed)
Result Note     Please notify patient>     CXR shows improvement- decr in size & number of nodules c/w response to therapy (Lupron & Casodex)...    This is oviously a good sign...   ---  lmomtcb x1

## 2014-06-20 NOTE — Telephone Encounter (Signed)
Called and spoke with pt and he is aware of results per SN.  Pt voiced his understanding and nothing further is needed.  

## 2014-06-20 NOTE — Telephone Encounter (Signed)
Pt returning call to triage-please call patient back at [phone]. Thanks.

## 2014-06-20 NOTE — Telephone Encounter (Signed)
LMTCB on home and cell #. [place] Bing, CMA

## 2014-07-08 ENCOUNTER — Telehealth: Payer: Self-pay | Admitting: Pulmonary Disease

## 2014-07-08 NOTE — Telephone Encounter (Signed)
Called and spoke to pt. Pt requesting a refill on Percocet to last him till his neurologist appt on 07/29/2014. Percocet last filled on 06/18/14 for #90, 1-2 tabs TID prn.   Dr. Lenna Gilford, please advise on refill. thanks.   Allergies  Allergen Reactions  . Simvastatin Other (See Comments)    REACTION: pt states INTOL to ZOCOR w/ leg pains    Current Outpatient Prescriptions on File Prior to Visit  Medication Sig Dispense Refill  . ALPRAZolam (XANAX) 0.5 MG tablet TAKE1/2 TO 1 TABLET BY MOUTH THREE TIMES DAILY AS NEEDED ANXIETY 90 tablet 5  . aspirin 81 MG tablet Take 81 mg by mouth daily.    Marland Kitchen atenolol (TENORMIN) 25 MG tablet Take 1 tablet (25 mg total) by mouth daily. 30 tablet 6  . b complex vitamins tablet Take 1 tablet by mouth daily.    . Calcium-Magnesium-Vitamin D (CALCIUM 1200+D3 PO) Take 1 tablet by mouth daily.    . cefdinir (OMNICEF) 300 MG capsule Take 300 mg by mouth 2 (two) times daily.    . Chlorphen-PE-Acetaminophen (NOREL AD) 4-10-325 MG TABS Take 1 tablet by mouth 3 (three) times daily.    . Cyanocobalamin (VITAMIN B-12 PO) Take 1 tablet by mouth daily.    Marland Kitchen gabapentin (NEURONTIN) 100 MG capsule Take 1 capsule (100 mg total) by mouth 3 (three) times daily. 90 capsule 1  . GLUCOSAMINE SULFATE PO Take 1 tablet by mouth 2 (two) times daily.    Marland Kitchen lisinopril (PRINIVIL,ZESTRIL) 10 MG tablet Take 10 mg by mouth daily.    . Multiple Vitamins-Minerals (MULTIVITAMIN PO) Take 1 tablet by mouth daily.    . Omega-3 Fatty Acids (FISH OIL) 1000 MG CAPS Take 1,000 mg by mouth daily.    Marland Kitchen omeprazole (PRILOSEC) 40 MG capsule Take 1 capsule (40 mg total) by mouth daily. 30 capsule 6  . oxyCODONE-acetaminophen (PERCOCET/ROXICET) 5-325 MG per tablet Take 1-2 tablets by mouth 3 (three) times daily as needed for moderate pain or severe pain. 90 tablet 0  . pravastatin (PRAVACHOL) 20 MG tablet Take 1 tablet (20 mg total) by mouth daily. 30 tablet 6  . predniSONE (DELTASONE) 20 MG tablet 3 tabs po  daily x 3 days, then 2 tabs x 3 days, then 1.5 tabs x 3 days, then 1 tab x 3 days, then 0.5 tabs x 3 days 27 tablet 0   No current facility-administered medications on file prior to visit.

## 2014-07-09 MED ORDER — OXYCODONE-ACETAMINOPHEN 5-325 MG PO TABS: 1.0000 | ORAL_TABLET | Freq: Three times a day (TID) | ORAL | Status: DC | PRN

## 2014-07-09 NOTE — Telephone Encounter (Signed)
Spoke with pt, he is aware of refill and recs.  rx placed up front for pt.  Nothing further needed.

## 2014-07-09 NOTE — Telephone Encounter (Signed)
Per SN---  Ok to refill the percocet #90.  Please make him aware and to be very careful with this rx.  This medication can be very addictive.  thanks

## 2014-07-30 ENCOUNTER — Ambulatory Visit (INDEPENDENT_AMBULATORY_CARE_PROVIDER_SITE_OTHER): Payer: Medicare Other | Admitting: Pulmonary Disease

## 2014-07-30 ENCOUNTER — Encounter: Payer: Self-pay | Admitting: Pulmonary Disease

## 2014-07-30 VITALS — BP 140/84 | HR 66 | Temp 97.4°F | Ht 70.5 in | Wt 198.4 lb

## 2014-07-30 DIAGNOSIS — J410 Simple chronic bronchitis: Secondary | ICD-10-CM

## 2014-07-30 DIAGNOSIS — C7800 Secondary malignant neoplasm of unspecified lung: Secondary | ICD-10-CM

## 2014-07-30 DIAGNOSIS — M79604 Pain in right leg: Secondary | ICD-10-CM

## 2014-07-30 DIAGNOSIS — I10 Essential (primary) hypertension: Secondary | ICD-10-CM

## 2014-07-30 DIAGNOSIS — C61 Malignant neoplasm of prostate: Secondary | ICD-10-CM

## 2014-07-30 MED ORDER — AZITHROMYCIN 250 MG PO TABS: ORAL_TABLET | ORAL | Status: DC

## 2014-07-30 MED ORDER — HYDROCODONE-HOMATROPINE 5-1.5 MG/5ML PO SYRP: 5.0000 mL | ORAL_SOLUTION | Freq: Four times a day (QID) | ORAL | Status: DC | PRN

## 2014-07-30 NOTE — Patient Instructions (Signed)
Today we updated your med list in our EPIC system...    Continue your current medications the same...  For your respiratory symptoms >>    Take the ZPak as directed (we made this refillable if you need more later on)...    We also wrote for a cough syrup- HYCODAN- one tsp every 6h as needed for cough...    You may also use the OTC MUCINEX 600mg  tabs- one tab up to 4 times daily w/ fluids as needed for the congestion...   Call for any questions...  Let's plan a follow up visit in 4 months, sooner if needed for problems.Marland KitchenMarland Kitchen

## 2014-07-30 NOTE — Progress Notes (Signed)
Subjective:     Patient ID: [name], male   DOB: Jul 10, 1941, 74 y.o.   MRN: [id number]  HPI 74 y/o WM here for a follow up visit... he has multiple medical problems as noted below...   ~  SEE PREV EPIC NOTES FOR OLDER DATA >>   ~  February 05, 2013:  38yrROV>  VThaddeausreturns after a long hiatus- being cared for by DC[employer]for Cards, & DrGrapey for Urology;  He has numerous medical issues- addressed below, but returns asking for a nerve pill describing lots of stress w/ his business etc; we discussed trial Alpraz0.533mtid as needed...     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    HBP> on Aten25, Lisin10;  BP= 142/78 & wt=199# (same as 2011); he denies CP, palpit, SOB, dizzy, edema, etc...    CAD> rec to take ASA81; followed by DrHochrein & last seen 10/13- stable, no changes made...    Dyslipidemia> on Prav20;  FLP 6/14 showed TChol 169, TG 242, HDL 31, LDL 93 & we reviewed diet, exercise, & rec adding FENOFIBRATE 16074m...     GI- GERD, Divertics> on Omep40;  He denies abd pain, dysphagia, n/v, c/d, blood seen; last colon by DrPatterson was 2004 & he is due for f/u procedure...     Hx Prostate Cancer> followed by DrGrapey but pt missed his last appt & is due now- Hx prostate ca 2007 w/ IMRT & hormone therapy x2yr26yrlowly rising PSA per Urology but Lab 6/14 showed PSA= 3.21    DJD> on OTC analgesics as needed; he has seen DrDaldorf in the past for left knee pain...    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 We reviewed prob list, meds, xrays and labs> see below for updates >>   LABS 6/14:  FLP- chol ok but TG=242 HDL=31;  Chems- wnl;  CBC- wnl;  TSH=1.28;  PSA=3.21 (prev in 2011 it was 0.57)...  ~  August 13, 2013:  62mo R58mo [name] has gained 9# up to 208# today, he blames  it on quitting chewing tobacco but I told him the trade-off was worth it, asked to get on diet & incr exercise... He has no new complaints or concerns...    Breathing is stable but needs to incr exercise program...    BP is controlled w/ Aten25 & Lisin10;  BP=128/78 today & he denies CP, palpit, SOB, edema...    He saw DrHochrein 10/14- on above + ASA; stable & asymptomatic, no changes made, f/u 61yr...75yrLipids treated w/ Prav20 & diet but he's gained wt as noted; intol to Fenofib previously- we reviewed diet, exercise, wt reduction strategies...    He saw DrGrapey for prostate Ca follow up 8/14> treated w/ XRT but slowly rising PSA since then, they are on Observation protocol w/ check ups every 62mo... 762moeviewed prob list, meds, xrays and labs> see below for updates >> Given PREVNAR-13 & Rx for Shingles vaccine...   EKG 10/14 by DrHochrein showed SBrady, rate52, borderline EKG, NAD...   ADDENDUM> PSA 2/15 by DrGrapey was 4.26...   ~  February 10, 2014:  62mo ROV 53moVance indThaynes that he is doing well, no new complaints or concerns;  He has a rising PSA- followed by DrGrapey Q62mo w/69mo  PSADT est ~35mo& last measured 4.26 in Feb2015;  He saw Urology 3/15 & note reviewed (prev hx well outlined) and plan was to continue monitoring Q670montil PSA ~10 then consider hormone therapy...  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    HBP> on Aten25, Lisin10;  BP= 132/82 & wt=203# (down 5# last 8m3mohe denies CP, palpit, SOB, dizzy, edema, etc...     CAD> on ASA81; followed by DrHochrein & last seen 10/14- remains stable, no changes made, f/u 91yr32yr   Dyslipidemia> on Prav20; hx intol to Fenofibrate; FLP 6/14 showed TChol 169, TG 242, HDL 31, LDL 93 & we reviewed diet, exercise, & wt reduction...    GI- GERD, Divertics> on Omep40;  He denies abd pain, dysphagia, n/v, c/d, blood seen; last colon by DrPatterson was 8/14 & showed mod divertics, no polyps, and he rec f/u colon for routine risk ~42yr79yrHx Prostate Cancer> followed by DrGrapey w/ hx prostate ca 2007 w/ IMRT & hormone therapy x2yrs;90yrhas had slowly rising PSA= 3.21 [id number]= 4.26 [id number] PSA= 6.15 (SEE BELOW)    DJD> on OTC analgesics as needed; he has seen DrDaldorf in the past for left knee pain...    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 We reviewed prob list, meds, xrays and labs> see below for updates >>   CXR 7/15 shows norm heart size & Ao atherosclerosis, bilat pulm nodules are visualized (new since 2/11 CXR) w/ largest ~2cm in LUL...    LABS 7/15:  FLP- not at goals w/ TG elev & HDL low;  Chems- ok x BS=135;  CBC- wnl;  TSH=1.38;  PSA=6.15...  CT Chest/ Abd/ Pelvis 7/15 showed numerous bilat pulm nodules; no signif findings in abd/pelvis x gallstones, adrenal adenoma, scat divertics, and atherosclerotic changes; degen changes in spine PLAN>  CXR shows recurrent lung nodules (assoc w/ his rising PSA); prev noted nodules from 2007 were assoc w/ PSA of 10.6 at time of Dx, and the CXR cleared w/o nodules seen in 2008, 2009, 2011; he has been followed by Urology w/ slowly rising PSA at 3.1 last yr and 4.26 earlier this yr, and now=6.15   With f/u CXR showing recurrent mult bilat pulm nodules (also seen at time of Prostate cancer Dx in 2007 & resolved (2008, 2009, 2011) after XRT & hormone therapy at time of dx...   PET Scan 02/20/14 showed numerous bilat noncalcif pulm nodules, 5 measured w/ max SUV  1.6-4.6, coronary atherosclerosis, no skeletal metssmall right adrenal nodule (adenma favored)...  Nuclear Bone Scan 02/27/14 showed focal areas of uptake in spine (most likely degenerative) & knee/ shoulder- no convincing evid for mets to bone...  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  PLAN>  He has upcoming appt w/ DrGrapey for Urology   ~  June 18, 2014:  39moROV & VLeelyndsaw DrGrapey 9/15 who agreed w/ our Dx of an unusual case of Prostate Cancer w/ mult lung metastasis; they started Rx w/ Lupron (planning shots Q437moand used Casodex for the 1st 30d only; he has f/u appt in Jan2016 w/ Urology for PSA & Testos levels; by his account he has been tolerating the Rx well w/o new issues until last week...  He states that on 11/23 he had a "head cold & chest congestion" so he went to the local [facility] & was evaluated & given Omnicef & a steroid shot in right buttock; on 11/24 he awoke w/ pain in his right leg (?hip area & thigh area mostly) & states he "couldn't walk" due to the pain & numbness; on 11/25 he went to his Chiro w/ XRays done (later told they showed "alot of issues in my lower back"); he has an inversion table he's used on & off for yrs but hasn't been on it recently; 11/27 was Thanksgiving & on 11/28 he was hurting so much [facility] XRays of Lumbar sp w/ extensive degen changes, XRay of right hip w/ min degen arthritis, VenDopplers neg for DVT, no rash presenty, sl tender right hip but good ROM> they told him likely siatica- given Pred taper & Percocet5 which he has taken ~3/d & today notes sl improved...  Still no rash present & exam is unchanged from what is described- min tender, good ROM, essent neg SLR, symmetric reflexes and strength; note- [facility]...  Daughter requests that he see DrLucey for Ortho eval &  we will try to expedite this ASAP for pt...     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     Prostate Cancer w/ lung mets> 9/15 OV by DrGrapey reviewed; pt was started on Lupron shots (planned Q4mo62mogiven Casodex x30d to start; pt has f/u visit sched for [id number] labs (PSA & Testos) and 2nd Lupron injection... We reviewed prob list, meds, xrays and labs> see below for updates >>   CXR 12/15 showed signif improvement w/ decr in size & number of nodules... PLAN>> now w/ new onset of right leg pain- primarily right thigh area w/ some burning as well; no rash present & asked to watch for vesicular eruption & call if any changes; this is the vicinity of meralgia paresthetica & we discussed trial GABAPENTIN start 100mg64m; continue Pred taper given in ER and refilled the Oxycodone5 Tid + constip prophylactic regime w/ Miralax daily & Senakot-S Qhs... Plan ROV 6wks...  ~  July 30, 2014:  6wk ROV & VanceDerelles me he went to an Ortho in [place] who did MRI and referred him to NS- DrJenkins, told him he could do surg but advised holding off since his pain level is diminished, still taking the Neurontin100Tid & Oxycodone5 prn; we do not have notes from these consultants & he is asked to get records [place] into Epic...     VanceJackston/o a URI w/ cough, beige sput, sinus drainage, & chest soreness from the cough; he denies f/c/s, denies incr SOB, etc; we decided to treat w/ ZPak, Hycodan, Mucinex, fluids...     He has f/u appt w/ Urology, DrGrapey soon; as noted his last CXR 12/15 showed signif improvement w/ decr in size & number  of nodules (believed to be metastatic prostate cancer)...  We reviewed prob list, meds, xrays and labs> see below for updates >>  PLAN>> we decided to treat w/ ZPak, Hycodan, Mucinex, fluids; he will f/u in 69mow/ repeat CXR...          Problem List:     BRONCHITIS, RECURRENT (ICD-491.9) - no recent URI symptoms... 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 ~  CXR 2007 when coronary stent placed showed ?sm nodules seen... ~  CT Chest 8/07 showed bilat noncalcif lung nodules, varying size in all lobes & largest= 9.413min LLL, no adenopathy... note: PSA=10.6 ~  CT Abd&Pelvis 9/07 showed mult sm lung nodules at bases bilat, gallstones, sm duod divertic, scat atherosclerotic calcif, no mass or adenop, asymmetric enlarged prostate...  ~  CT Chest 12/07 showed mult pulm nodules bilat- no change, no adenopathy, coronary calcif...  ~  f/u CXR 8/08 without nodules seen... (note: PSA= 0.03) ~  f/u CXR 11/09 [name], no lesions seen... ~  f/u CXR 2/11 showed [name], NAD...Marland Kitchen(note: PSA= 0.57) ~  CXR 7/15 shows norm heart size & Ao atherosclerosis, bilat pulm nodules are visualized (new since 2/11 CXR) w/ largest ~2cm in LUL... 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 ~  CXR 12/15 (after Lupron shot & 6m27mo Casodex) showed signif improvement w/ decr in size & number of nodules ~  1/16: he returned w/ URI- cough, sput, chest soreness and we decided to treat w/ ZPak, Hycodan, Mucinex, fluids...  HYPERTENSION (ICD-401.9) - contolled on ATENOLOL 1m66m & LISINOPRIL 10mg23m.  ~  9/11:  BP=110/62 here and usually 120s/ 70s at home- denies HA, fatigue, visual changes, CP, palipit, dizziness, syncope, dyspnea, edema, etc... ~  7/14:  on Aten25, Lisin10;  BP= 142/78 & wt=199# (same as 2011); he denies CP, palpit, SOB, dizzy, edema, etc. ~  7/15: on Aten25, Lisin10;  BP= 132/82 & wt=203# (down 5# last 58mo);68moremains asymptomatic... ~  12/15: on Aten25, Lisin10; BP= 140/90 & wt=202#; he denies CP, palpit, SOB, edema...  CAD (ICD-414.00) - followed by DrHochrein... on ASA 31mg/d38m  NuclearStressTest 8/07 abnormal w/ ant ischemia... ~  cath 8/07 showed 95% mid-LAD stenosis, & 20% lesions in the other 2 vessels, EF=60%... subseq PTCA w/ non-drug eluting stent... ~  OV w/ Cards= 5/10 & note reviewed- BP sl elevated & Lisinopril added. ~  He saw DrHochrein 10/13> doing well, no new symptoms, exam was neg, no change in meds & rec aggressive risk factor reduction strategy...  ~  EKG 10/13 showed NSR, rate62, LAD, otherw wnl, NAD... ~  He saw DrHochrein 10/14- on above + ASA; stable & asymptomatic, no changes made, f/u 33yr ~  24yraw DrHochrein 10/15> HBP, CAD, HL- stable w/o CP/ palpit/ SOB/ edema/ etc (despite being diagnosed w/ met prostate ca to lungs)...  DYSLIPIDEMIA (ICD-272.4) - on PRAVACHOL 20mg/d a2mol OK...  ~  FLP 8/08 [place] TChol 168, TG 143, HDL34, LDL105 ~  FLP 2/09 showed TChol 163, TG 207, HDL 34, LDL 91 ~  FLP 5/10 showed TChol 148, TG 131, HDL 33, LDL 89 ~  FLP 9/11 showed TChol 163, TG 140, HDL 42, LDL 93 ~  FLP 6/14 on Prav20 showed TChol 169, TG 242, HDL 31, LDL 93  ~  FLP 7/15 on Prav20 showed TChol 174, TG 290, HDL 31, LDL 87... We reviewed low fat diet 7 need for wt reduction.  GERD (ICD-530.81) - prev on OMEP20 but causes diarrhea he says, and ACIPHEX 34m/d works better...  ~  last EGD (DrPatterson)was 8/04 showing GERD & stricture dilated... ~  5/11: presented to GI w/ recurrent dysphagia- EGD showed 3cmHH, stricture dilated, no Barrett's, changed to Aciphex. ~  On Omep40 & he remains asymptomatic w/o dysphagia, CP, abd pain, n/v, c/d, blood seen...  DIVERTICULOSIS OF COLON (ICD-562.10) >>  ~  Colonoscopy 8/04 by DrPatterson was normal, f/u 149yr(scattered tics seen on 1996 flex) ~  7/14:  He is due for f/u colon 7 we will refer the chart to DrPatterson for review... ~  Colonoscopy 8/14 by DrPatterson showed mod divertics, no polyps, and he rec f/u colon for routine risk ~1086yr.  PROSTATE CANCER (ICD-185) - followed by DrPeterson- elevated  PSA found 8/07 (10.6)... biopsies were pos (Stage T2b, bilat dis w/ Gleason4+3=7 on one side & 8 on the other) & there is a family hx as well; he had bilat pulm nodules on CXR & CT Chest at diagnosis... [facility], then hormonal Rx for 2 yrs... [facility]... prev on TRELSTAR injections q 58mo28monthPROVERA for the hot flashes>> x2yrs53yreg bone scan 1/08;  PSA here= 0.03 in 2008, and CXR cleared w/o nodules seen in 2008, 2009, 2011 (see above)... ~  2/11: we don't have recent note from [place] request info sent to us at[place] visit. ~  9/11: labs here showed PSA= 0.57 & we will forward to DrPetWilkes/13:  He had f/u visit w/ DrGrapey> PSA had been slowly rising and ~1 when they last checked; DrGrapey rec Q6mo f558mo~  7/14:  Pt missed his 3/14 appt w/ Urology & is sched to see them 8/14; PSA recorded 6/14 = 3.21 & we sent copy to DrGrapey... ~  3/15:  He had f/u DrGrapey> prostate Ca dx 2007, [facility] 54yrRT in 2008; slowly rising PSA w/ doubling time ~31mo no15moGrapey follows pt Q6mo & pl72mos for hormone therapy when PSA>10... ~  7/15:  Routine CXR showed return of bilat pulm nodules assoc w/ PSA= 6.15  (CT Chest, Abd, Pelvis is planned & discussion w/ DrGrapey & Oncology)=> Dx w/ metastatic prostate adenocarcinoma (lung mets) ~  9/15: he was started on Lupron shots Q4mo, give65mosodex daily x30d per DrGrapey; f/u OV planned Jan2016...[id number]: CXR showed improvement w/ decr in size & number of lung nodules... He has a follow up appt w/ DrGrapey in Jan2016.  DEGENERATIVE JOINT DISEASE (ICD-715.90) >>  RIGHT THIGH AREA PAIN 12/15 >> ?etiology, [facility]... ~  his left knee gives him some pain on and off; he saw DrDaldorf for this 12/08 & he thought there might be a torn meniscus, they decided to Rx w/ NAPROSEN  which helps; may yet need MRI/ arthroscopy... ~  2/11: discussed trial of Mobic to see if this works for him. ~  12/15: presented w/ right thigh area pain ?etiology [facility]; [employer] extensive degen changes in lumbar spine; sl improved w/ Pred & Percocet from [facility]; [place]ford[place], ?could this be meralgia paresthetica? They want  to see DrLucey for Ortho eval, we will give trial GABAPENTIN100Tid, watch for rash (he had Shingles vaccine 2 mo ago)... ~  1/16: Thor tells me he went to an Ortho in [place] who did MRI and referred him to NS- DrJenkins, told him he could do surg but advised holding off since his pain level is diminished, still taking the Neurontin100Tid & Oxycodone5 prn; we do not have notes from these consultants & he is asked to get records sent to [place] to scan into Epic.  HEALTH MAINTENANCE >>  ~  GI>  Followed by [facility] & last colon was 2004- f/u due now... ~  GU>  Followed by DrGrapey & seen every 34mofor PSA recheck but he missed the 3/14 appt & our PSA 6/14= 3.21 (copy to Urology & he has appt 8/14)... ~  Immuniz>  He gets the yearly flu vax (last 10/13);  Had Pneumovax several yrs ago;  He received the PREVNAR-13 vax 1/15; Not sure of last Tetanus vaccine; Asking about the shingles shot=> Rx written [facility]...   Past Surgical History  Procedure Laterality Date  . Eyelid surgery for lid lag    . Foot surgery    . Hydrocele excision / repair    . Lung nodules    . Mastectomy      benign tumor  . Inguinal hernia repair    . Prostate treatment      cancer  . Skin cancer excision  2012    skin cancer on right ear    Outpatient Encounter Prescriptions as of 07/30/2014  Medication Sig  . ALPRAZolam (XANAX) 0.5 MG tablet TAKE1/2 TO 1 TABLET BY MOUTH THREE TIMES DAILY AS NEEDED ANXIETY  . aspirin 81 MG tablet Take 81 mg by mouth daily.  .Marland Kitchenatenolol (TENORMIN) 25 MG tablet Take 1  tablet (25 mg total) by mouth daily.  .Marland Kitchenb complex vitamins tablet Take 1 tablet by mouth daily.  . Calcium-Magnesium-Vitamin D (CALCIUM 1200+D3 PO) Take 1 tablet by mouth daily.  . Cyanocobalamin (VITAMIN B-12 PO) Take 1 tablet by mouth daily.  .Marland Kitchengabapentin (NEURONTIN) 100 MG capsule Take 1 capsule (100 mg total) by mouth 3 (three) times daily.  .Marland KitchenGLUCOSAMINE SULFATE PO Take 1 tablet by mouth 2 (two) times daily.  .Marland Kitchenlisinopril (PRINIVIL,ZESTRIL) 10 MG tablet Take 10 mg by mouth daily.  . Multiple Vitamins-Minerals (MULTIVITAMIN PO) Take 1 tablet by mouth daily.  . Omega-3 Fatty Acids (FISH OIL) 1000 MG CAPS Take 1,000 mg by mouth daily.  .Marland Kitchenomeprazole (PRILOSEC) 40 MG capsule Take 1 capsule (40 mg total) by mouth daily.  . pravastatin (PRAVACHOL) 20 MG tablet Take 1 tablet (20 mg total) by mouth daily.  . [DISCONTINUED] cefdinir (OMNICEF) 300 MG capsule Take 300 mg by mouth 2 (two) times daily.  . [DISCONTINUED] Chlorphen-PE-Acetaminophen (NOREL AD) 4-10-325 MG TABS Take 1 tablet by mouth 3 (three) times daily.  . [DISCONTINUED] oxyCODONE-acetaminophen (PERCOCET/ROXICET) 5-325 MG per tablet Take 1-2 tablets by mouth 3 (three) times daily as needed for moderate pain or severe pain. (Patient not taking: Reported on 07/30/2014)  . [DISCONTINUED] predniSONE (DELTASONE) 20 MG tablet 3 tabs po daily x 3 days, then 2 tabs x 3 days, then 1.5 tabs x 3 days, then 1 tab x 3 days, then 0.5 tabs x 3 days (Patient not taking: Reported on 07/30/2014)    Allergies  Allergen Reactions  . Simvastatin Other (See Comments)    REACTION: pt states INTOL  to [facility] w/ leg pains    Current Medications, Allergies, Past Medical History, Past Surgical History, Family History, and Social History were reviewed in [employer] record.   Review of Systems         See HPI - The patient denies anorexia, fever, weight loss, weight gain, vision loss, decreased hearing, hoarseness, chest pain, syncope,  dyspnea on exertion, peripheral edema, prolonged cough, headaches, hemoptysis, abdominal pain, melena, hematochezia, severe indigestion/heartburn, hematuria, incontinence, muscle weakness, suspicious skin lesions, transient blindness, difficulty walking, depression, unusual weight change, abnormal bleeding, enlarged lymph nodes, and angioedema.     Objective:   Physical Exam    WD, WN, 74 y/o WM in NAD... GENERAL:  Alert & oriented; pleasant & cooperative... HEENT:  [place]/AT, EOM-wnl, PERRLA, EACs-[name], TMs-wnl, NOSE-[name], THROAT-[name] & wnl. NECK:  Supple w/ fairROM; no JVD; normal carotid impulses w/o bruits; no thyromegaly or nodules palpated; no lymphadenopathy. CHEST:  [name] to P & A; without wheezes/ rales/ or rhonchi heard... HEART:  Regular Rhythm; without murmurs/ rubs/ or gallops detected... ABDOMEN:  Soft & nontender; normal bowel sounds; no organomegaly or masses palpated... EXT: without deformities, mild arthritic changes; no varicose veins/ venous insuffic/ or edema.      c/o pain in right thigh area- good ROM hip, neg SLR, reflexes symmetric, no rash apparent... NEURO:  CN's intact; motor testing normal; sensory testing normal; gait normal & balance OK. DERM:  No lesions noted; no rash etc...  RADIOLOGY DATA:  Reviewed in the EPIC EMR & discussed w/ the patient...  LABORATORY DATA:  Reviewed in the EPIC EMR & discussed w/ the patient...   Assessment:      Hx Bronchitis, and Mult Pulm Nodules>  His breathing has been fine, at baseline w/o new complaints etc;  Previous mult pulm nodules from 2007 were likely prostate ca mets that resolved w/ his treatment IMRT 2008 and hormone therapy x82yr;  CXRs in 2008, 2009, & 2011 were [name];  CXR 7/15 showed return of bilat pulm nodules (assoc w/ rising PSA=6.15) & subseq CT Chest, PET scan, Bone scan, Needle bx lung lesion confirmed Dx metastatic prostate cancer in lung. 9/15> DrGrapey started treatment w/ Lupron Q437moCasodex x30d at  outset... 12/15> CXR already shows signs of improvement w/ decr size & number of lesions...  HBP>  Stable on low dose Aten & Lisin, continue same...  CAD>  Followed by DrHochrein; seen last 10/14 & he remains stable, no changes made; needs to incr his exercise program...  Dyslipidemia>  On Prav20 but TG & HDL deranged; needs better diet, exercise, & wt reduction...  GI- GERD, Diverics, needs f/u colon>  F/u colon 8/14 was neg x divertics, no polyps, f/u rec for 1079yr.  Prostate Ca>  Managed by DrGrapey as noted; rising PSA indicates recurrent dis and mult lung nodules represent metastatic dis...  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 Right leg pain ?etiology> we treated empirically w/ Neurontin & prn oxycod; he went to see Ortho in [place], MRI done, referred to NS- DrJJenkins & we will call for his notes...  Anxiety>  He is requesting Rx- try ALPRAZOLAM 0.5mg29md as needed...      Plan:     Patient's Medications  New Prescriptions   AZITHROMYCIN (ZITHROMAX) 250 MG TABLET    Take as directed   HYDROCODONE-HOMATROPINE (HYCODAN) 5-1.5 MG/5ML SYRUP    Take 5 mLs by mouth every 6 (six) hours as needed for cough.  Previous Medications  ALPRAZOLAM (XANAX) 0.5 MG TABLET    TAKE1/2 TO 1 TABLET BY MOUTH THREE TIMES DAILY AS NEEDED ANXIETY   ASPIRIN 81 MG TABLET    Take 81 mg by mouth daily.   ATENOLOL (TENORMIN) 25 MG TABLET    Take 1 tablet (25 mg total) by mouth daily.   B COMPLEX VITAMINS TABLET    Take 1 tablet by mouth daily.   CALCIUM-MAGNESIUM-VITAMIN D (CALCIUM 1200+D3 PO)    Take 1 tablet by mouth daily.   CYANOCOBALAMIN (VITAMIN B-12 PO)    Take 1 tablet by mouth daily.   GABAPENTIN (NEURONTIN) 100 MG CAPSULE    Take 1 capsule (100 mg total) by mouth 3 (three) times daily.   GLUCOSAMINE SULFATE PO    Take 1 tablet by mouth 2 (two) times daily.   LISINOPRIL (PRINIVIL,ZESTRIL) 10 MG TABLET    Take 10 mg by mouth daily.   MULTIPLE VITAMINS-MINERALS  (MULTIVITAMIN PO)    Take 1 tablet by mouth daily.   OMEGA-3 FATTY ACIDS (FISH OIL) 1000 MG CAPS    Take 1,000 mg by mouth daily.   OMEPRAZOLE (PRILOSEC) 40 MG CAPSULE    Take 1 capsule (40 mg total) by mouth daily.   PRAVASTATIN (PRAVACHOL) 20 MG TABLET    Take 1 tablet (20 mg total) by mouth daily.  Modified Medications   No medications on file  Discontinued Medications   CEFDINIR (OMNICEF) 300 MG CAPSULE    Take 300 mg by mouth 2 (two) times daily.   CHLORPHEN-PE-ACETAMINOPHEN (NOREL AD) 4-10-325 MG TABS    Take 1 tablet by mouth 3 (three) times daily.   OXYCODONE-ACETAMINOPHEN (PERCOCET/ROXICET) 5-325 MG PER TABLET    Take 1-2 tablets by mouth 3 (three) times daily as needed for moderate pain or severe pain.   PREDNISONE (DELTASONE) 20 MG TABLET    3 tabs po daily x 3 days, then 2 tabs x 3 days, then 1.5 tabs x 3 days, then 1 tab x 3 days, then 0.5 tabs x 3 days

## 2014-08-13 ENCOUNTER — Ambulatory Visit: Payer: Medicare Other | Admitting: Pulmonary Disease

## 2014-10-20 ENCOUNTER — Other Ambulatory Visit: Payer: Self-pay | Admitting: Pulmonary Disease

## 2014-10-20 NOTE — Telephone Encounter (Signed)
Pt requesting refill on xanax 0.5mg  1/2 -1 tab TID PRN anxiety. Future office visit 11/2014. Last appt was 07/2014. Last time medication filled was 07/22/2013 with 5 refills. SN please advise. Thanks

## 2014-11-06 ENCOUNTER — Other Ambulatory Visit: Payer: Self-pay | Admitting: Pulmonary Disease

## 2014-11-28 ENCOUNTER — Encounter: Payer: Self-pay | Admitting: Pulmonary Disease

## 2014-11-28 ENCOUNTER — Ambulatory Visit (INDEPENDENT_AMBULATORY_CARE_PROVIDER_SITE_OTHER): Payer: Medicare Other | Admitting: Pulmonary Disease

## 2014-11-28 ENCOUNTER — Ambulatory Visit (INDEPENDENT_AMBULATORY_CARE_PROVIDER_SITE_OTHER)
Admission: RE | Admit: 2014-11-28 | Discharge: 2014-11-28 | Disposition: A | Discharge status: Home / Self Care | Payer: Medicare Other | Source: Ambulatory Visit | Attending: Pulmonary Disease | Admitting: Pulmonary Disease

## 2014-11-28 VITALS — BP 142/80 | HR 68 | Temp 97.1°F | Ht 71.0 in | Wt 197.8 lb

## 2014-11-28 DIAGNOSIS — C7800 Secondary malignant neoplasm of unspecified lung: Secondary | ICD-10-CM

## 2014-11-28 DIAGNOSIS — J410 Simple chronic bronchitis: Secondary | ICD-10-CM

## 2014-11-28 DIAGNOSIS — I10 Essential (primary) hypertension: Secondary | ICD-10-CM | POA: Diagnosis not present

## 2014-11-28 DIAGNOSIS — I2510 Atherosclerotic heart disease of native coronary artery without angina pectoris: Secondary | ICD-10-CM

## 2014-11-28 DIAGNOSIS — C61 Malignant neoplasm of prostate: Secondary | ICD-10-CM

## 2014-11-28 NOTE — Patient Instructions (Signed)
Today we updated your med list in our EPIC system...    Continue your current medications the same...  Today we rechecked your CXR...    We will contact you w/ the results when available...   Call for any questions...  Let's plan a follow up visit in 24mo, sooner if needed for problems.Marland KitchenMarland Kitchen

## 2014-11-28 NOTE — Progress Notes (Signed)
Subjective:     Patient ID: [name] [name], male   DOB: 13-Mar-1941, 74 y.o.   MRN: [id number]  HPI 74 y/o WM here for a follow up visit... he has multiple medical problems as noted below...   ~  SEE PREV EPIC NOTES FOR OLDER DATA >>   ~  February 05, 2013:  60yrROV>  [name] [name] after a long hiatus- being cared for by [employer] Cards, & [name] [name] for Urology;  He has numerous medical issues- addressed below, but [name] [name] for a nerve pill describing lots of stress w/ his business etc; we discussed trial Alpraz0.545mtid as needed...     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    HBP> on Aten25, Lisin10;  BP= 142/78 & wt=199# (same as 2011); he denies CP, palpit, SOB, dizzy, edema, etc...    CAD> rec to take ASA81; followed by [name] [name] & last seen 10/13- stable, no changes made...    Dyslipidemia> on Prav20;  FLP 6/14 showed TChol 169, TG 242, HDL 31, LDL 93 & we reviewed diet, exercise, & rec adding FENOFIBRATE 16065m...     GI- GERD, Divertics> on Omep40;  He denies abd pain, dysphagia, n/v, c/d, blood seen; last colon by DrPatterson was 2004 & he is due for f/u procedure...     Hx Prostate Cancer> followed by [name] [name] but pt missed his last appt & is due now- Hx prostate ca 2007 w/ IMRT & hormone therapy x2yr53yrlowly rising PSA per Urology but Lab 6/14 showed PSA= 3.21    DJD> on OTC analgesics as needed; he has seen DrDaldorf in the past for left knee pain...    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 We reviewed prob list, meds, xrays and labs> see below for updates >>   LABS 6/14:  FLP- chol ok but TG=242 HDL=31;  Chems- wnl;  CBC- wnl;  TSH=1.28;  PSA=3.21 (prev in 2011 it was 0.57)...  ~  August 13, 2013:  70mo R74mo [name] [name] has gained 9# up to 208# today, he blames  it on quitting chewing tobacco but I told him the trade-off was worth it, asked to get on diet & incr exercise... He has no new complaints or concerns...    Breathing is stable but needs to incr exercise program...    BP is controlled w/ Aten25 & Lisin10;  BP=128/78 today & he denies CP, palpit, SOB, edema...    He saw [name] [name] 10/14- on above + ASA; stable & asymptomatic, no changes made, f/u 5yr...6yrLipids treated w/ Prav20 & diet but he's gained wt as noted; intol to Fenofib previously- we reviewed diet, exercise, wt reduction strategies...    He saw [name] [name] for prostate Ca follow up 8/14> treated w/ XRT but slowly rising PSA since then, they are on Observation protocol w/ check ups every 70mo... 44moeviewed prob list, meds, xrays and labs> see below for updates >> Given PREVNAR-13 & Rx for Shingles vaccine...   EKG 10/14 by [name] [name] showed SBrady, rate52, borderline EKG, NAD...   ADDENDUM> PSA 2/15 by [name] [name] was 4.26...   ~  February 10, 2014:  70mo ROV 61moVance indKincades that he is doing well, no new complaints or concerns;  He has a rising PSA- followed by [name] [name] Q70mo w/71mo  PSADT est ~25mo& last measured 4.26 in Feb2015;  He saw Urology 3/15 & note reviewed (prev hx well outlined) and plan was to continue monitoring Q675montil PSA ~10 then consider hormone therapy...  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    HBP> on Aten25, Lisin10;  BP= 132/82 & wt=203# (down 5# last 21m55mohe denies CP, palpit, SOB, dizzy, edema, etc...     CAD> on ASA81; followed by [name] [name] & last seen 10/14- remains stable, no changes made, f/u 40yr63yr   Dyslipidemia> on Prav20; hx intol to Fenofibrate; FLP 6/14 showed TChol 169, TG 242, HDL 31, LDL 93 & we reviewed diet, exercise, & wt reduction...    GI- GERD, Divertics> on Omep40;  He denies abd pain, dysphagia, n/v, c/d, blood seen; last colon by DrPatterson was 8/14 & showed mod divertics, no polyps, and he rec f/u colon for routine risk ~96yr25yrHx Prostate Cancer> followed by [name] [name] w/ hx prostate ca 2007 w/ IMRT & hormone therapy x2yrs;23yrhas had slowly rising PSA= 3.21 [id number]= 4.26 [id number] PSA= 6.15 (SEE BELOW)    DJD> on OTC analgesics as needed; he has seen DrDaldorf in the past for left knee pain...    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 We reviewed prob list, meds, xrays and labs> see below for updates >>   CXR 7/15 shows norm heart size & Ao atherosclerosis, bilat pulm nodules are visualized (new since 2/11 CXR) w/ largest ~2cm in LUL...    LABS 7/15:  FLP- not at goals w/ TG elev & HDL low;  Chems- ok x BS=135;  CBC- wnl;  TSH=1.38;  PSA=6.15...  CT Chest/ Abd/ Pelvis 7/15 showed numerous bilat pulm nodules; no signif findings in abd/pelvis x gallstones, adrenal adenoma, scat divertics, and atherosclerotic changes; degen changes in spine PLAN>  CXR shows recurrent lung nodules (assoc w/ his rising PSA); prev noted nodules from 2007 were assoc w/ PSA of 10.6 at time of Dx, and the CXR cleared w/o nodules seen in 2008, 2009, 2011; he has been followed by Urology w/ slowly rising PSA at 3.1 last yr and 4.26 earlier this yr, and now=6.15   With f/u CXR showing recurrent mult bilat pulm nodules (also seen at time of Prostate cancer Dx in 2007 & resolved (2008, 2009, 2011) after XRT & hormone therapy at time of dx...   PET Scan 02/20/14 showed numerous bilat noncalcif pulm nodules, 5 measured w/ max SUV  1.6-4.6, coronary atherosclerosis, no skeletal metssmall right adrenal nodule (adenma favored)...  Nuclear Bone Scan 02/27/14 showed focal areas of uptake in spine (most likely degenerative) & knee/ shoulder- no convincing evid for mets to bone...  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  PLAN>  He has upcoming appt w/ [name] [name] for Urology   ~  June 18, 2014:  70moROV & VByrensaw [name] [name] 9/15 who agreed w/ our Dx of an unusual case of Prostate Cancer w/ mult lung metastasis; they started Rx w/ Lupron (planning shots Q461moand used Casodex for the 1st 30d only; he has f/u appt in Jan2016 w/ Urology for PSA & Testos levels; by his account he has been tolerating the Rx well w/o new issues until last week...  He states that on 11/23 he had a "head cold & chest congestion" so he went to the local [facility] & was evaluated & given Omnicef & a steroid shot in right buttock; on 11/24 he awoke w/ pain in his right leg (?hip area & thigh area mostly) & states he "couldn't walk" due to the pain & numbness; on 11/25 he went to his Chiro w/ XRays done (later told they showed "alot of issues in my lower back"); he has an inversion table he's used on & off for yrs but hasn't been on it recently; 11/27 was Thanksgiving & on 11/28 he was hurting so much [facility] X[facility]ays of Lumbar sp w/ extensive degen changes, XRay of right hip w/ min degen arthritis, VenDopplers neg for DVT, no rash presenty, sl tender right hip but good ROM> they told him likely siatica- given Pred taper & Percocet5 which he has taken ~3/d & today notes sl improved...  Still no rash present & exam is unchanged from what is described- min tender, good ROM, essent neg SLR, symmetric reflexes and strength; note- [facility]...  Daughter requests that he see DrLucey for Ortho eval &  we will try to expedite this ASAP for pt...     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     Prostate Cancer w/ lung mets> 9/15 OV by [name] [name] reviewed; pt was started on Lupron shots (planned Q4mo50mogiven Casodex x30d to start; pt has f/u visit sched for [id number] labs (PSA & Testos) and 2nd Lupron injection... We reviewed prob list, meds, xrays and labs> see below for updates >>   CXR 12/15 showed signif improvement w/ decr in size & number of nodules... PLAN>> now w/ new onset of right leg pain- primarily right thigh area w/ some burning as well; no rash present & asked to watch for vesicular eruption & call if any changes; this is the vicinity of meralgia paresthetica & we discussed trial GABAPENTIN start 100mg11m; continue Pred taper given in ER and refilled the Oxycodone5 Tid + constip prophylactic regime w/ Miralax daily & Senakot-S Qhs... Plan ROV 6wks...  ~  July 30, 2014:  6wk ROV & [name] [name] me he went to an Ortho in [place] who did MRI and referred him to NS- DrJenkins, told him he could do surg but advised holding off since his pain level is diminished, still taking the Neurontin100Tid & Oxycodone5 prn; we do not have notes from these consultants & he is asked to get records sent to [place] into Epic...     VanceAdeeb/o a URI w/ cough, beige sput, sinus drainage, & chest soreness from the cough; he denies f/c/s, denies incr SOB, etc; we decided to treat w/ ZPak, Hycodan, Mucinex, fluids...     He has f/u appt w/ Urology, [name] [name] soon; as noted his last CXR 12/15 showed signif improvement w/ decr in size & number  of nodules (believed to be metastatic prostate cancer)...  We reviewed prob list, meds, xrays and labs> see below for updates >>  PLAN>> we decided to treat w/ ZPak, Hycodan, Mucinex, fluids; he will f/u in 62mow/ repeat CXR...  ~  Nov 28, 2014:  425moOV & VaPrenticereports that is breathing is good, no problems reported- denies cough, sput, SOB, CP, etc;  His CC remains LBP- pt says MRI reveqaled bulging discs & he is sched for an epid steroid shot per DrJenkins (we still do [place] He continues to f/u w/ Urology- [name] [name] & pt indicates that he's due for lab work next week & a hormone shot after that- again he is asked to request records be sent to [place] his specialists for [place] review & so these records will be avail in EPIC...  EXAM reveals Afeb, VSS, O2sat=99% on RA;  HEENT- neg, mallampati2;  Chest- [name] w/o w/r/r;  Heart- RR w/o m/r/g;  Ext- neg w/o c/c/e...   CXR 5/16 shows regression of the bilat lung nodules & now resolved, norm heart size, NAD...  PLAN>>  VaAntuans stable from the pulmonary standpoint; he has mult specialists attending his various problems & he is reminded to get records sent to [place] review & scan into Epic...           Problem List:    BRONCHITIS, RECURRENT (ICD-491.9) - no recent URI symptoms... 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 ~  CXR 2007 when coronary stent placed showed ?sm nodules seen... ~  CT Chest 8/07 showed bilat noncalcif lung nodules, varying size in all lobes & largest= 9.15m56mn LLL, no adenopathy... note: PSA=10.6 ~  CT Abd&Pelvis 9/07 showed mult sm lung nodules at bases bilat, gallstones, sm duod divertic, scat atherosclerotic calcif, no mass or adenop, asymmetric enlarged prostate...  ~  CT Chest 12/07 showed mult pulm nodules bilat- no change, no adenopathy, coronary calcif...  ~  f/u CXR 8/08 without nodules seen... (note: PSA= 0.03) ~  f/u CXR 11/09 [name], no lesions seen... ~  f/u CXR 2/11 showed [name], NAD... Marland Kitchennote: PSA= 0.57) ~  CXR 7/15 shows norm heart size & Ao atherosclerosis, bilat pulm nodules are visualized (new since 2/11 CXR) w/ largest ~2cm in LUL... ~  CT  Chest, Abd, Pelvis;  PET Scan;  Nuclear Bone Scan;  CT needle bx of lung lesion => SEE ABOVE, all c/w metastatic prostate cancer to lungs... ~  CXR 12/15 (after Lupron shot & 71mo23moCasodex) showed signif improvement w/ decr in size & number of nodules ~  1/16: he returned w/ URI- cough, sput, chest soreness and we decided to treat w/ ZPak, Hycodan, Mucinex, fluids... ~  CXR 5/16 shows resolution of the prev metastatic nodules, heart size is wnl, NAD...   HYPERTENSION (ICD-401.9) - contolled on ATENOLOL 25mg3m& LISINOPRIL 10mg/5m  ~  9/11:  BP=110/62 here and usually 120s/ 70s at home- denies HA, fatigue, visual changes, CP, palipit, dizziness, syncope, dyspnea, edema, etc... ~  7/14:  on Aten25, Lisin10;  BP= 142/78 & wt=199# (same as 2011); he denies CP, palpit, SOB, dizzy, edema, etc. ~  7/15: on Aten25, Lisin10;  BP= 132/82 & wt=203# (down 5# last 73mo); 39moemains asymptomatic... ~  12/15: on Aten25, Lisin10; BP= 140/90 & wt=202#; he denies CP, palpit, SOB, edema... ~  5/16: on  Aten25, Lisin10; BP= 140/90 & he is asymptomatic... Reminded to elim sodium.  CAD (ICD-414.00) - followed by [name] [name]... on ASA 32m/d. ~  NuclearStressTest 8/07 abnormal w/ ant ischemia... ~  cath 8/07 showed 95% mid-LAD stenosis, & 20% lesions in the other 2 vessels, EF=60%... subseq PTCA w/ non-drug eluting stent... ~  OV w/ Cards= 5/10 & note reviewed- BP sl elevated & Lisinopril added. ~  He saw [name] [name] 10/13> doing well, no new symptoms, exam was neg, no change in meds & rec aggressive risk factor reduction strategy...  ~  EKG 10/13 showed NSR, rate62, LAD, otherw wnl, NAD... ~  He saw [name] [name] 10/14- on above + ASA; stable & asymptomatic, no changes made, f/u 167yr  He saw [name] [name] 10/15> HBP, CAD, HL- stable w/o CP/ palpit/ SOB/ edema/ etc (despite being diagnosed w/ met prostate ca to lungs)...  DYSLIPIDEMIA (ICD-272.4) - on PRAVACHOL 203m and tol OK...  ~  FLPIvanhoe08 showed TChol 168, TG 143,  HDL34, LDL105 ~  FLP 2/09 showed TChol 163, TG 207, HDL 34, LDL 91 ~  FLP 5/10 showed TChol 148, TG 131, HDL 33, LDL 89 ~  FLP 9/11 showed TChol 163, TG 140, HDL 42, LDL 93 ~  FLP 6/14 on Prav20 showed TChol 169, TG 242, HDL 31, LDL 93  ~  FLP 7/15 on Prav20 showed TChol 174, TG 290, HDL 31, LDL 87... We reviewed low fat diet 7 need for wt reduction.  GERD (ICD-530.81) - prev on OMEP20 but causes diarrhea he says, and ACIPHEX 20m64mworks better...  ~  last EGD (DrPatterson)was 8/04 showing GERD & stricture dilated... ~  5/11: presented to GI w/ recurrent dysphagia- EGD showed 3cmHH, stricture dilated, no Barrett's, changed to Aciphex. ~  On Omep40 & he remains asymptomatic w/o dysphagia, CP, abd pain, n/v, c/d, blood seen...  DIVERTICULOSIS OF COLON (ICD-562.10) >>  ~  Colonoscopy 8/04 by DrPatterson was normal, f/u 31yr78yrattered tics seen on 1996 flex) ~  7/14:  He is due for f/u colon 7 we will refer the chart to DrPatterson for review... ~  Colonoscopy 8/14 by DrPatterson showed mod divertics, no polyps, and he rec f/u colon for routine risk ~31yrs22yrPROSTATE CANCER (ICD-185) - followed by DrPeterson- elevated PSA found 8/07 (10.6)... biopsies were pos (Stage T2b, bilat dis w/ Gleason4+3=7 on one side & 8 on the other) & there is a family hx as well; he had bilat pulm nodules on CXR & CT Chest at diagnosis... [facility], then [facility] Rx for 2 yrs... [facility] prev on TRELSTAR injections q 89month4monthVERA for the hot flashes>> x2yrs;  56yrbone scan 1/08;  PSA here= 0.03 in 2008, and CXR cleared w/o nodules seen in 2008, 2009, 2011 (see above)... ~  2/11: we don't have recent note from DrPetersMesquitell request info sent to us at [place] visit. ~  9/11: labs here showed PSA= 0.57 & we will forward to [place]PetersFox [place]:  He had f/u visit w/ [name] [name]> PSA had been slowly rising and ~1 when they last checked; [name] [name] rec Q6mo  f/u69mo 7/14:  Pt missed his 3/14 appt w/ Urology & is sched to see them 8/14; PSA recorded 6/14 = 3.21 & we sent copy to [name] [name]... ~  3/15:  He had f/u [name] [name]> prostate Ca dx 2007, [facility]) & I60yr  in 2008; slowly rising PSA w/ doubling time ~82monow; [name] [name] follows pt Q679mo plan is for hormone therapy when PSA>10... ~  7/15:  Routine CXR showed return of bilat pulm nodules assoc w/ PSA= 6.15  (CT Chest, Abd, Pelvis is planned & discussion w/ [name] [name] & Oncology)=> Dx w/ metastatic prostate adenocarcinoma (lung mets) ~  9/15: he was started on Lupron shots Q4m25moiven Casodex daily x30d per [name] [name]; f/u OV planned [id number] ~  12/15: CXR showed improvement w/ decr in size & number of lung nodules... He has a follow up appt w/ [name] [name] in Jan2016.  DEGENERATIVE JOINT DISEASE (ICD-715.90) >>  RIGHT THIGH AREA PAIN 12/15 >> ?etiology, [facility], & by [facility]... ~  his left knee gives him some pain on and off; he saw DrDaldorf for this 12/08 & he thought there might be a torn meniscus, they decided to Rx w/ NAPROSEN which helps; may yet need MRI/ arthroscopy... ~  2/11: discussed trial of Mobic to see if this works for him. 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 They want to see DrLucey for Ortho eval, we will give trial GABAPENTIN100Tid, watch for rash (he had Shingles vaccine 2 mo ago)... ~  1/16: [name] [name] me he went to an Ortho in [place] who did MRI and referred him to NS- DrJenkins, told him he could do surg but advised holding off since his pain level is diminished, still taking the Neurontin100Tid & Oxycodone5 prn; we do not have notes from these  consultants & he is asked to get records sent to us [place] scan into Epic.  HEALTH MAINTENANCE >>  ~  GI>  [facility] Stores[facility] colon [facility]- f/u due now... ~  GU>  Followed by [name] [name] & seen every 17mo79mo PSA recheck but he missed the 3/14 appt & our PSA 6/14= 3.21 (copy to Urology & he has appt 8/14)... ~  Immuniz>  He gets the yearly flu vax (last 10/13);  Had Pneumovax several yrs ago;  He received the PREVNAR-13 vax 1/15; Not sure of last Tetanus vaccine; [name] about the shingles shot=> Rx written & [facility]...   Past Surgical History  Procedure Laterality Date  . Eyelid surgery for lid lag    . Foot surgery    . Hydrocele excision / repair    . Lung nodules    . Mastectomy      benign tumor  . Inguinal hernia repair    . Prostate treatment      cancer  . Skin cancer excision  2012    skin cancer on right ear    Outpatient Encounter Prescriptions as of 11/28/2014  Medication Sig  . ALPRAZolam (XANAX) 0.5 MG tablet TAKE ONE-HALF TO 1 TABLET BY MOUTH THREE TIMES DAILY AS NEEDED ANXIETY  . aspirin 81 MG tablet Take 81 mg by mouth daily.  . atMarland Kitchennolol (TENORMIN) 25 MG tablet Take 1 tablet (25 mg total) by mouth daily.  . b Marland Kitchenomplex vitamins tablet Take 1 tablet by mouth daily.  . Calcium-Magnesium-Vitamin D (CALCIUM 1200+D3 PO) Take 1 tablet by mouth daily.  . Cyanocobalamin (VITAMIN B-12 PO) Take 1 tablet by mouth daily.  . gaMarland Kitchenapentin (NEURONTIN) 100 MG capsule Take 1 capsule (  100 mg total) by mouth 3 (three) times daily.  Marland Kitchen GLUCOSAMINE SULFATE PO Take 1 tablet by mouth 2 (two) times daily.  Marland Kitchen lisinopril (PRINIVIL,ZESTRIL) 10 MG tablet Take 10 mg by mouth daily.  . Multiple Vitamins-Minerals (MULTIVITAMIN PO) Take 1 tablet by mouth daily.  . Omega-3 Fatty Acids (FISH OIL) 1000 MG CAPS Take 1,000 mg by mouth daily.  Marland Kitchen omeprazole (PRILOSEC) 40 MG capsule Take 1 capsule (40 mg total) by mouth daily.  . pravastatin (PRAVACHOL) 20 MG tablet TAKE 1  TABLET BY MOUTH EVERY DAY  . [DISCONTINUED] azithromycin (ZITHROMAX) 250 MG tablet Take as directed (Patient not taking: Reported on 11/28/2014)  . [DISCONTINUED] HYDROcodone-homatropine (HYCODAN) 5-1.5 MG/5ML syrup Take 5 mLs by mouth every 6 (six) hours as needed for cough. (Patient not taking: Reported on 11/28/2014)   No facility-administered encounter medications on file as of 11/28/2014.    Allergies  Allergen Reactions  . Simvastatin Other (See Comments)    REACTION: pt states INTOL to ZOCOR w/ leg pains    Current Medications, Allergies, Past Medical History, Past Surgical History, Family History, and Social History were reviewed in [employer] record.   Review of Systems         See HPI - The patient denies anorexia, fever, weight loss, weight gain, vision loss, decreased hearing, hoarseness, chest pain, syncope, dyspnea on exertion, peripheral edema, prolonged cough, headaches, hemoptysis, abdominal pain, melena, hematochezia, severe indigestion/heartburn, hematuria, incontinence, muscle weakness, suspicious skin lesions, transient blindness, difficulty walking, depression, unusual weight change, abnormal bleeding, enlarged lymph nodes, and angioedema.     Objective:   Physical Exam    WD, WN, 74 y/o WM in NAD... GENERAL:  Alert & oriented; pleasant & cooperative... HEENT:  [place]/AT, EOM-wnl, PERRLA, EACs-[name], TMs-wnl, NOSE-[name], THROAT-[name] & wnl. NECK:  Supple w/ fairROM; no JVD; normal carotid impulses w/o bruits; no thyromegaly or nodules palpated; no lymphadenopathy. CHEST:  [name] to P & A; without wheezes/ rales/ or rhonchi heard... HEART:  Regular Rhythm; without murmurs/ rubs/ or gallops detected... ABDOMEN:  Soft & nontender; normal bowel sounds; no organomegaly or masses palpated... EXT: without deformities, mild arthritic changes; no varicose veins/ venous insuffic/ or edema.     c/o pain in right thigh area- good ROM hip, neg SLR, reflexes  symmetric, no rash apparent... NEURO:  CN's intact; motor testing normal; sensory testing normal; gait normal & balance OK. DERM:  No lesions noted; no rash etc...  RADIOLOGY DATA:  Reviewed in the EPIC EMR & discussed w/ the patient...  LABORATORY DATA:  Reviewed in the EPIC EMR & discussed w/ the patient...   Assessment:      Hx Bronchitis, and Mult Pulm Nodules>  His breathing has been fine, at baseline w/o new complaints etc;  Previous mult pulm nodules from 2007 were likely prostate ca mets that resolved w/ his treatment IMRT 2008 and hormone therapy x69yr;  CXRs in 2008, 2009, & 2011 were [name];  CXR 7/15 showed return of bilat pulm nodules (assoc w/ rising PSA=6.15) & subseq CT Chest, PET scan, Bone scan, Needle bx lung lesion confirmed Dx metastatic prostate cancer in lung. 9/15> [name] [name] started treatment w/ Lupron Q456moCasodex x30d at outset... 12/15> CXR already shows signs of improvement w/ decr size & number of lesions... 5/16> CXR nodules have resolved w/ ongoing treatment for his prostate cancer...  HBP>  Stable on low dose Aten & Lisin, continue same...  CAD>  Followed by [name] [name]; seen last 10/14 & he remains stable,  no changes made; needs to incr his exercise program...  Dyslipidemia>  On Prav20 but TG & HDL deranged; needs better diet, exercise, & wt reduction...  GI- GERD, Diverics, needs f/u colon>  F/u colon 8/14 was neg x divertics, no polyps, f/u rec for 41yr...  Prostate Ca>  Managed by [name] [name] as noted; rising PSA indicates recurrent dis and mult lung nodules represent metastatic dis...  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 Right leg pain ?etiology> we treated empirically w/ Neurontin & prn oxycod; he went to see Ortho in [place], MRI done, referred to NS- DrJJenkins & we will call for his notes...  Anxiety>  He is requesting Rx- try ALPRAZOLAM 0.529mtid as needed...      Plan:     Patient's Medications  New Prescriptions    No medications on file  Previous Medications   ALPRAZOLAM (XANAX) 0.5 MG TABLET    TAKE ONE-HALF TO 1 TABLET BY MOUTH THREE TIMES DAILY AS NEEDED ANXIETY   ASPIRIN 81 MG TABLET    Take 81 mg by mouth daily.   ATENOLOL (TENORMIN) 25 MG TABLET    Take 1 tablet (25 mg total) by mouth daily.   B COMPLEX VITAMINS TABLET    Take 1 tablet by mouth daily.   CALCIUM-MAGNESIUM-VITAMIN D (CALCIUM 1200+D3 PO)    Take 1 tablet by mouth daily.   CYANOCOBALAMIN (VITAMIN B-12 PO)    Take 1 tablet by mouth daily.   GABAPENTIN (NEURONTIN) 100 MG CAPSULE    Take 1 capsule (100 mg total) by mouth 3 (three) times daily.   GLUCOSAMINE SULFATE PO    Take 1 tablet by mouth 2 (two) times daily.   LISINOPRIL (PRINIVIL,ZESTRIL) 10 MG TABLET    Take 10 mg by mouth daily.   MULTIPLE VITAMINS-MINERALS (MULTIVITAMIN PO)    Take 1 tablet by mouth daily.   OMEGA-3 FATTY ACIDS (FISH OIL) 1000 MG CAPS    Take 1,000 mg by mouth daily.   OMEPRAZOLE (PRILOSEC) 40 MG CAPSULE    Take 1 capsule (40 mg total) by mouth daily.   PRAVASTATIN (PRAVACHOL) 20 MG TABLET    TAKE 1 TABLET BY MOUTH EVERY DAY  Modified Medications   No medications on file  Discontinued Medications   AZITHROMYCIN (ZITHROMAX) 250 MG TABLET    Take as directed   HYDROCODONE-HOMATROPINE (HYCODAN) 5-1.5 MG/5ML SYRUP    Take 5 mLs by mouth every 6 (six) hours as needed for cough.

## 2014-12-28 ENCOUNTER — Other Ambulatory Visit: Payer: Self-pay | Admitting: Pulmonary Disease

## 2015-01-15 ENCOUNTER — Other Ambulatory Visit: Payer: Self-pay | Admitting: Pulmonary Disease

## 2015-03-01 ENCOUNTER — Other Ambulatory Visit: Payer: Self-pay | Admitting: Pulmonary Disease

## 2015-03-02 ENCOUNTER — Other Ambulatory Visit: Payer: Self-pay | Admitting: Orthopedic Surgery

## 2015-03-10 ENCOUNTER — Encounter (HOSPITAL_BASED_OUTPATIENT_CLINIC_OR_DEPARTMENT_OTHER): Payer: Self-pay | Admitting: *Deleted

## 2015-03-12 ENCOUNTER — Ambulatory Visit (HOSPITAL_BASED_OUTPATIENT_CLINIC_OR_DEPARTMENT_OTHER): Payer: Medicare Other | Admitting: Anesthesiology

## 2015-03-12 ENCOUNTER — Encounter (HOSPITAL_BASED_OUTPATIENT_CLINIC_OR_DEPARTMENT_OTHER): Payer: Self-pay | Admitting: Orthopedic Surgery

## 2015-03-12 ENCOUNTER — Encounter (HOSPITAL_BASED_OUTPATIENT_CLINIC_OR_DEPARTMENT_OTHER)
Admission: RE | Disposition: A | Discharge status: Home / Self Care | Payer: Self-pay | Source: Ambulatory Visit | Attending: Orthopedic Surgery

## 2015-03-12 ENCOUNTER — Ambulatory Visit (HOSPITAL_BASED_OUTPATIENT_CLINIC_OR_DEPARTMENT_OTHER)
Admission: RE | Admit: 2015-03-12 | Discharge: 2015-03-12 | Disposition: A | Discharge status: Home / Self Care | Payer: Medicare Other | Source: Ambulatory Visit | Attending: Orthopedic Surgery | Admitting: Orthopedic Surgery

## 2015-03-12 DIAGNOSIS — I2510 Atherosclerotic heart disease of native coronary artery without angina pectoris: Secondary | ICD-10-CM | POA: Diagnosis not present

## 2015-03-12 DIAGNOSIS — Y9389 Activity, other specified: Secondary | ICD-10-CM | POA: Insufficient documentation

## 2015-03-12 DIAGNOSIS — K219 Gastro-esophageal reflux disease without esophagitis: Secondary | ICD-10-CM | POA: Insufficient documentation

## 2015-03-12 DIAGNOSIS — Z8546 Personal history of malignant neoplasm of prostate: Secondary | ICD-10-CM | POA: Diagnosis not present

## 2015-03-12 DIAGNOSIS — X58XXXA Exposure to other specified factors, initial encounter: Secondary | ICD-10-CM | POA: Diagnosis not present

## 2015-03-12 DIAGNOSIS — M1990 Unspecified osteoarthritis, unspecified site: Secondary | ICD-10-CM | POA: Diagnosis not present

## 2015-03-12 DIAGNOSIS — Z7982 Long term (current) use of aspirin: Secondary | ICD-10-CM | POA: Diagnosis not present

## 2015-03-12 DIAGNOSIS — S46211A Strain of muscle, fascia and tendon of other parts of biceps, right arm, initial encounter: Secondary | ICD-10-CM | POA: Diagnosis not present

## 2015-03-12 DIAGNOSIS — Z87891 Personal history of nicotine dependence: Secondary | ICD-10-CM | POA: Diagnosis not present

## 2015-03-12 DIAGNOSIS — Z79899 Other long term (current) drug therapy: Secondary | ICD-10-CM | POA: Diagnosis not present

## 2015-03-12 DIAGNOSIS — F419 Anxiety disorder, unspecified: Secondary | ICD-10-CM | POA: Insufficient documentation

## 2015-03-12 DIAGNOSIS — I10 Essential (primary) hypertension: Secondary | ICD-10-CM | POA: Diagnosis not present

## 2015-03-12 DIAGNOSIS — Z955 Presence of coronary angioplasty implant and graft: Secondary | ICD-10-CM | POA: Diagnosis not present

## 2015-03-12 HISTORY — PX: DISTAL BICEPS TENDON REPAIR: SHX1461

## 2015-03-12 HISTORY — DX: Strain of muscle, fascia and tendon of other parts of biceps, unspecified arm, initial encounter: S46.219A

## 2015-03-12 LAB — POCT I-STAT, CHEM 8
BUN: 17 mg/dL (ref 6–20)
BUN: 22 mg/dL — ABNORMAL HIGH (ref 6–20)
CALCIUM ION: 1.07 mmol/L — AB (ref 1.13–1.30)
CHLORIDE: 104 mmol/L (ref 101–111)
CHLORIDE: 106 mmol/L (ref 101–111)
CREATININE: 0.9 mg/dL (ref 0.61–1.24)
CREATININE: 0.9 mg/dL (ref 0.61–1.24)
Calcium, Ion: 1.09 mmol/L — ABNORMAL LOW (ref 1.13–1.30)
GLUCOSE: 131 mg/dL — AB (ref 65–99)
GLUCOSE: 132 mg/dL — AB (ref 65–99)
HCT: 41 % (ref 39.0–52.0)
HCT: 41 % (ref 39.0–52.0)
HEMOGLOBIN: 13.9 g/dL (ref 13.0–17.0)
Hemoglobin: 13.9 g/dL (ref 13.0–17.0)
POTASSIUM: 4 mmol/L (ref 3.5–5.1)
Potassium: 4 mmol/L (ref 3.5–5.1)
Sodium: 138 mmol/L (ref 135–145)
Sodium: 138 mmol/L (ref 135–145)
TCO2: 21 mmol/L (ref 0–100)
TCO2: 21 mmol/L (ref 0–100)

## 2015-03-12 SURGERY — REPAIR, TENDON, BICEPS, DISTAL
Anesthesia: Regional | Site: Arm Upper | Laterality: Right

## 2015-03-12 MED ORDER — OXYCODONE HCL 5 MG PO TABS: 5.0000 mg | ORAL_TABLET | Freq: Once | ORAL | Status: DC | PRN

## 2015-03-12 MED ORDER — CHLORHEXIDINE GLUCONATE 4 % EX LIQD: 60.0000 mL | Freq: Once | CUTANEOUS | Status: DC

## 2015-03-12 MED ORDER — BUPIVACAINE-EPINEPHRINE (PF) 0.5% -1:200000 IJ SOLN
INTRAMUSCULAR | Status: DC | PRN
  Administered 2015-03-12: 30 mL via PERINEURAL

## 2015-03-12 MED ORDER — FENTANYL CITRATE (PF) 100 MCG/2ML IJ SOLN
INTRAMUSCULAR | Status: AC
  Filled 2015-03-12: qty 2

## 2015-03-12 MED ORDER — CEFAZOLIN SODIUM-DEXTROSE 2-3 GM-% IV SOLR
INTRAVENOUS | Status: AC
  Filled 2015-03-12: qty 50

## 2015-03-12 MED ORDER — CEFAZOLIN SODIUM-DEXTROSE 2-3 GM-% IV SOLR: 2.0000 g | INTRAVENOUS | Status: DC

## 2015-03-12 MED ORDER — DEXAMETHASONE SODIUM PHOSPHATE 4 MG/ML IJ SOLN
INTRAMUSCULAR | Status: DC | PRN
  Administered 2015-03-12: 10 mg via INTRAVENOUS

## 2015-03-12 MED ORDER — OXYCODONE-ACETAMINOPHEN 10-325 MG PO TABS: 1.0000 | ORAL_TABLET | ORAL | Status: DC | PRN

## 2015-03-12 MED ORDER — SCOPOLAMINE 1 MG/3DAYS TD PT72: 1.0000 | MEDICATED_PATCH | Freq: Once | TRANSDERMAL | Status: DC | PRN

## 2015-03-12 MED ORDER — PROMETHAZINE HCL 25 MG/ML IJ SOLN
6.2500 mg | INTRAMUSCULAR | Status: DC | PRN
Start: 2015-03-12 — End: 2015-03-12

## 2015-03-12 MED ORDER — LACTATED RINGERS IV SOLN
INTRAVENOUS | Status: DC
  Administered 2015-03-12: 10:00:00 via INTRAVENOUS

## 2015-03-12 MED ORDER — OXYCODONE HCL 5 MG/5ML PO SOLN: 5.0000 mg | Freq: Once | ORAL | Status: DC | PRN

## 2015-03-12 MED ORDER — MIDAZOLAM HCL 2 MG/2ML IJ SOLN
1.0000 mg | INTRAMUSCULAR | Status: DC | PRN
Start: 2015-03-12 — End: 2015-03-12
  Administered 2015-03-12: 1 mg via INTRAVENOUS

## 2015-03-12 MED ORDER — HYDROMORPHONE HCL 1 MG/ML IJ SOLN: 0.2500 mg | INTRAMUSCULAR | Status: DC | PRN

## 2015-03-12 MED ORDER — MIDAZOLAM HCL 2 MG/2ML IJ SOLN
INTRAMUSCULAR | Status: AC
  Filled 2015-03-12: qty 2

## 2015-03-12 MED ORDER — GLYCOPYRROLATE 0.2 MG/ML IJ SOLN: 0.2000 mg | Freq: Once | INTRAMUSCULAR | Status: DC | PRN

## 2015-03-12 MED ORDER — FENTANYL CITRATE (PF) 100 MCG/2ML IJ SOLN
50.0000 ug | INTRAMUSCULAR | Status: DC | PRN
  Administered 2015-03-12: 50 ug via INTRAVENOUS

## 2015-03-12 MED ORDER — ONDANSETRON HCL 4 MG/2ML IJ SOLN
INTRAMUSCULAR | Status: DC | PRN
  Administered 2015-03-12: 4 mg via INTRAVENOUS

## 2015-03-12 MED ORDER — PROPOFOL 10 MG/ML IV BOLUS
INTRAVENOUS | Status: DC | PRN
  Administered 2015-03-12: 50 mg via INTRAVENOUS
  Administered 2015-03-12: 200 mg via INTRAVENOUS

## 2015-03-12 MED ORDER — FENTANYL CITRATE (PF) 100 MCG/2ML IJ SOLN
INTRAMUSCULAR | Status: AC
Start: 2015-03-12 — End: 2015-03-12
  Filled 2015-03-12: qty 4

## 2015-03-12 SURGICAL SUPPLY — 68 items
ANCH SUT 12 BICEPS BTN STRL (Orthopedic Implant) ×1 IMPLANT
BLADE MINI RND TIP GREEN BEAV (BLADE) IMPLANT
BLADE SURG 15 STRL LF DISP TIS (BLADE) ×1 IMPLANT
BLADE SURG 15 STRL SS (BLADE) ×1
BNDG CMPR 9X4 STRL LF SNTH (GAUZE/BANDAGES/DRESSINGS) ×1
BNDG COHESIVE 3X5 TAN STRL LF (GAUZE/BANDAGES/DRESSINGS) ×4 IMPLANT
BNDG ESMARK 4X9 LF (GAUZE/BANDAGES/DRESSINGS) ×2 IMPLANT
BNDG GAUZE ELAST 4 BULKY (GAUZE/BANDAGES/DRESSINGS) ×2 IMPLANT
BUTTON DISTAL BICEPS (Orthopedic Implant) ×2 IMPLANT
CHLORAPREP W/TINT 26ML (MISCELLANEOUS) ×2 IMPLANT
CORDS BIPOLAR (ELECTRODE) ×2 IMPLANT
COVER BACK TABLE 60X90IN (DRAPES) ×2 IMPLANT
CUFF TOURNIQUET SINGLE 18IN (TOURNIQUET CUFF) ×2 IMPLANT
DECANTER SPIKE VIAL GLASS SM (MISCELLANEOUS) IMPLANT
DRAPE EXTREMITY T 121X128X90 (DRAPE) ×2 IMPLANT
DRAPE OEC MINIVIEW 54X84 (DRAPES) ×2 IMPLANT
DRAPE SURG 17X23 STRL (DRAPES) IMPLANT
DRAPE U 20/CS (DRAPES) ×2 IMPLANT
DRSG PAD ABDOMINAL 8X10 ST (GAUZE/BANDAGES/DRESSINGS) ×2 IMPLANT
GAUZE SPONGE 4X4 12PLY STRL (GAUZE/BANDAGES/DRESSINGS) ×2 IMPLANT
GAUZE SPONGE 4X4 16PLY XRAY LF (GAUZE/BANDAGES/DRESSINGS) IMPLANT
GAUZE XEROFORM 1X8 LF (GAUZE/BANDAGES/DRESSINGS) ×2 IMPLANT
GLOVE BIO SURGEON STRL SZ8.5 (GLOVE) IMPLANT
GLOVE BIOGEL M 7.0 STRL (GLOVE) ×2 IMPLANT
GLOVE BIOGEL PI IND STRL 7.5 (GLOVE) ×1 IMPLANT
GLOVE BIOGEL PI IND STRL 8.5 (GLOVE) ×1 IMPLANT
GLOVE BIOGEL PI INDICATOR 7.5 (GLOVE) ×1
GLOVE BIOGEL PI INDICATOR 8.5 (GLOVE) ×1
GLOVE SURG ORTHO 8.0 STRL STRW (GLOVE) ×2 IMPLANT
GOWN STRL REUS W/ TWL LRG LVL3 (GOWN DISPOSABLE) ×2 IMPLANT
GOWN STRL REUS W/TWL LRG LVL3 (GOWN DISPOSABLE) ×4
GOWN STRL REUS W/TWL XL LVL3 (GOWN DISPOSABLE) ×4 IMPLANT
INSERTER BUTTON (SYSTAGENIX WOUND MANAGEMENT) ×2 IMPLANT
LOOP VESSEL MAXI BLUE (MISCELLANEOUS) IMPLANT
NDL SUT 6 .5 CRC .975X.05 MAYO (NEEDLE) ×1 IMPLANT
NEEDLE 1/2 CIR CATGUT .05X1.09 (NEEDLE) IMPLANT
NEEDLE MAYO TAPER (NEEDLE) ×2
NEEDLE PRECISIONGLIDE 27X1.5 (NEEDLE) IMPLANT
NS IRRIG 1000ML POUR BTL (IV SOLUTION) ×2 IMPLANT
PACK BASIN DAY SURGERY FS (CUSTOM PROCEDURE TRAY) ×2 IMPLANT
PAD CAST 4YDX4 CTTN HI CHSV (CAST SUPPLIES) ×1 IMPLANT
PADDING CAST ABS 3INX4YD NS (CAST SUPPLIES) ×1
PADDING CAST ABS 4INX4YD NS (CAST SUPPLIES) ×1
PADDING CAST ABS COTTON 3X4 (CAST SUPPLIES) ×1 IMPLANT
PADDING CAST ABS COTTON 4X4 ST (CAST SUPPLIES) ×1 IMPLANT
PADDING CAST COTTON 4X4 STRL (CAST SUPPLIES) ×2
PIN DRILL ACL TIGHTROPE 4MM (PIN) ×2 IMPLANT
SLEEVE SCD COMPRESS KNEE MED (MISCELLANEOUS) ×2 IMPLANT
SPLINT PLASTER CAST XFAST 3X15 (CAST SUPPLIES) ×30 IMPLANT
SPLINT PLASTER XTRA FASTSET 3X (CAST SUPPLIES) ×30
SPONGE LAP 4X18 X RAY DECT (DISPOSABLE) ×2 IMPLANT
STOCKINETTE 4X48 STRL (DRAPES) ×2 IMPLANT
SUT 2 FIBERLOOP 20 STRT BLUE (SUTURE) ×2
SUT ETHIBOND 0 MO6 C/R (SUTURE) IMPLANT
SUT ETHILON 4 0 PS 2 18 (SUTURE) ×2 IMPLANT
SUT FIBERWIRE #2 38 T-5 BLUE (SUTURE)
SUT MERSILENE 3 0 FS 1 (SUTURE) IMPLANT
SUT SILK 2 0 SH (SUTURE) ×2 IMPLANT
SUT VIC AB 4-0 BRD 54 (SUTURE) IMPLANT
SUT VIC AB 4-0 P-3 18XBRD (SUTURE) IMPLANT
SUT VIC AB 4-0 P3 18 (SUTURE)
SUT VICRYL 4-0 PS2 18IN ABS (SUTURE) ×2 IMPLANT
SUTURE 2 FIBERLOOP 20 STRT BLU (SUTURE) ×1 IMPLANT
SUTURE FIBERWR #2 38 T-5 BLUE (SUTURE) IMPLANT
SYR BULB 3OZ (MISCELLANEOUS) ×2 IMPLANT
SYR CONTROL 10ML LL (SYRINGE) IMPLANT
TOWEL OR 17X24 6PK STRL BLUE (TOWEL DISPOSABLE) ×2 IMPLANT
UNDERPAD 30X30 (UNDERPADS AND DIAPERS) ×2 IMPLANT

## 2015-03-12 NOTE — Op Note (Signed)
Dictation Number [id number]

## 2015-03-12 NOTE — Brief Op Note (Signed)
03/12/2015  12:48 PM  PATIENT:  [name]  74 y.o. male  PRE-OPERATIVE DIAGNOSIS:  RIGHT BICEPS TEAR  POST-OPERATIVE DIAGNOSIS:  RIGHT BICEPS TEAR  PROCEDURE:  Procedure(s): REPAIR/RECONSTRUCTION RIGHT BICEPS TENDON (Right)  SURGEON:  Surgeon(s) and Role:    * Daryll Brod, MD - Primary    * Leanora Cover, MD  PHYSICIAN ASSISTANT:   ASSISTANTS: K Carel Carrier,MD   ANESTHESIA:   regional and general  EBL:  Total I/O In: 1000 [I.V.:1000] Out: -   BLOOD ADMINISTERED:none  DRAINS: none   LOCAL MEDICATIONS USED:  NONE  SPECIMEN:  No Specimen  DISPOSITION OF SPECIMEN:  N/A  COUNTS:  YES  TOURNIQUET:   Total Tourniquet Time Documented: Upper Arm (Right) - 67 minutes Total: Upper Arm (Right) - 67 minutes   DICTATION: .Other Dictation: Dictation Number [id number]  PLAN OF CARE: Discharge to home after PACU  PATIENT DISPOSITION:  PACU - hemodynamically stable.

## 2015-03-12 NOTE — Discharge Instructions (Addendum)

## 2015-03-12 NOTE — Transfer of Care (Signed)
Immediate Anesthesia Transfer of Care Note  Patient: [name]  Procedure(s) Performed: Procedure(s): REPAIR/RECONSTRUCTION RIGHT BICEPS TENDON (Right)  Patient Location: PACU  Anesthesia Type:GA combined with regional for post-op pain  Level of Consciousness: awake, alert  and oriented  Airway & Oxygen Therapy: Patient Spontanous Breathing and Patient connected to face mask oxygen  Post-op Assessment: Report given to RN and Post -op Vital signs reviewed and stable  Post vital signs: Reviewed and stable  Last Vitals:  Filed Vitals:   03/12/15 1030  BP: 124/65  Pulse: 52  Temp:   Resp: 19    Complications: No apparent anesthesia complications

## 2015-03-12 NOTE — Anesthesia Preprocedure Evaluation (Signed)
Anesthesia Evaluation  Patient identified by MRN, date of birth, ID band Patient awake    Reviewed: Allergy & Precautions, NPO status , Patient's Chart, lab work & pertinent test results  History of Anesthesia Complications Negative for: history of anesthetic complications  Airway Mallampati: I  TM Distance: >3 FB Neck ROM: Full    Dental  (+) Edentulous Upper, Edentulous Lower   Pulmonary former smoker,    Pulmonary exam normal       Cardiovascular hypertension, + CAD and + Cardiac Stents Normal cardiovascular exam    Neuro/Psych Anxiety negative neurological ROS  negative psych ROS   GI/Hepatic Neg liver ROS, GERD-  ,  Endo/Other  negative endocrine ROS  Renal/GU negative Renal ROS     Musculoskeletal   Abdominal   Peds  Hematology   Anesthesia Other Findings   Reproductive/Obstetrics                             Anesthesia Physical Anesthesia Plan  ASA: III  Anesthesia Plan: General   Post-op Pain Management: GA combined w/ Regional for post-op pain   Induction: Intravenous  Airway Management Planned: Oral ETT  Additional Equipment:   Intra-op Plan:   Post-operative Plan: Extubation in OR  Informed Consent: I have reviewed the patients History and Physical, chart, labs and discussed the procedure including the risks, benefits and alternatives for the proposed anesthesia with the patient or authorized representative who has indicated his/her understanding and acceptance.   Dental advisory given  Plan Discussed with: CRNA, Anesthesiologist and Surgeon  Anesthesia Plan Comments:         Anesthesia Quick Evaluation

## 2015-03-12 NOTE — Anesthesia Procedure Notes (Addendum)
Anesthesia Regional Block:  Supraclavicular block  Pre-Anesthetic Checklist: ,, timeout performed, Correct Patient, Correct Site, Correct Laterality, Correct Procedure, Correct Position, site marked, Risks and benefits discussed,  Surgical consent,  Pre-op evaluation,  At surgeon's request and post-op pain management  Laterality: Right  Prep: chloraprep       Needles:  Injection technique: Single-shot  Needle Type: Echogenic Stimulator Needle     Needle Length: 9cm 9 cm Needle Gauge: 21 and 21 G    Additional Needles:  Procedures: ultrasound guided (picture in chart) and nerve stimulator Supraclavicular block  Nerve Stimulator or Paresthesia:  Response: triceps, 0.5 mA,   Additional Responses:   Narrative:  Start time: 03/12/2015 10:30 AM End time: 03/12/2015 10:40 AM Injection made incrementally with aspirations every 5 mL.  Performed by: Personally  Anesthesiologist: Suzette Battiest  Additional Notes: Risks, benefits and alternative to block explained extensively.  Patient tolerated procedure well, without complications.   Procedure Name: LMA Insertion Date/Time: 03/12/2015 11:23 AM Performed by: Maryella Shivers Pre-anesthesia Checklist: Patient identified, Emergency Drugs available, Suction available and Patient being monitored Patient Re-evaluated:Patient Re-evaluated prior to inductionOxygen Delivery Method: Circle System Utilized Preoxygenation: Pre-oxygenation with 100% oxygen Intubation Type: IV induction Ventilation: Mask ventilation without difficulty LMA: LMA inserted LMA Size: 5.0 Number of attempts: 1 Airway Equipment and Method: bite block Placement Confirmation: positive ETCO2 Tube secured with: Tape Dental Injury: Teeth and Oropharynx as per pre-operative assessment

## 2015-03-12 NOTE — Progress Notes (Signed)
Assisted Dr. Rodman Comp with right, supraclavicular block. Side rails up, monitors on throughout procedure. See vital signs in flow sheet. Tolerated Procedure well.

## 2015-03-12 NOTE — H&P (Signed)
[name] is a 74 year old left hand dominant male referred by Dr. Ronnie Derby for a consultation with respect to an injury he suffered to his right elbow. This occurred approximately 6 weeks ago. He was lifting a sheet of plywood when he noticed some pain in his antecubital fossa. Two weeks he was pulling up on the power take off of a truck when he noted a repeat pain. This included bruising. He was seen by Dr. Ronnie Derby in [place] and x-rays were taken and MRI revealing a ruptured biceps tendon distal insertion. He has no prior history of injury. This is to his right side. He has no history of diabetes or thyroid problems. He does have a history of arthritis. He has no history of gout. He complains of intermittent, mild aching type pain with a feeling of weakness. He states it has gotten somewhat better. He states he has limited use secondary to discomfort. He localizes this in the central aspect of his right antecubital fossa.   PAST MEDICAL HISTORY:  He has no known drug allergies. He is on the following medications: Lisinopril, Pravastatin, Atenolol, alprazolam, aspirin, vitamins, fish oil, B-complex, B12, Glucosamine, magnesium, potassium, and calcium. He has had stents placed. He does have prostate cancer at the present time.   FAMILY MEDICAL HISTORY: Positive for high BP  SOCIAL HISTORY:  He does not smoke. He drinks socially. 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  REVIEW OF SYSTEMS: Positive for prostate cancer, glasses, high BP, heart attack, otherwise negative 14 points.  [name] is an 74 y.o. male.   Chief Complaint: rupture distal biceps tendon right  HPI: see above  Past Medical History  Diagnosis Date  . Recurrent aspiration bronchitis/pneumonia   . Pulmonary nodule   . CAD (coronary artery disease)     anterior ischemia on a stress perfusion study.  (Catheterization in 2007, demonstrating 95% mid-LAD stenosis.  The circumflex has 20% proximal stenosis, the right  coronary artery had 20% mid stenosis.  The EF was 60%.  He had a non drug eluting stent placed.    Marland Kitchen Unspecified essential hypertension   . Other and unspecified hyperlipidemia   . Esophageal reflux   . Esophageal stricture   . Diverticulosis of colon (without mention of hemorrhage)   . Malignant neoplasm of prostate   . Osteoarthrosis, unspecified whether generalized or localized, unspecified site   . Osteoarthrosis, unspecified whether generalized or localized, unspecified site   . Biceps muscle tear     right    Past Surgical History  Procedure Laterality Date  . Eyelid surgery for lid lag    . Foot surgery    . Hydrocele excision / repair    . Lung nodules    . Mastectomy      benign tumor  . Prostate treatment      cancer  . Skin cancer excision  2012    skin cancer on right ear  . Inguinal hernia repair      74 yrs old    Family History  Problem Relation Age of Onset  . Prostate cancer Father   . Prostate cancer Brother   . Colon cancer Neg Hx    Social History:  reports that he quit smoking about 37 years ago. His smoking use included Cigarettes. He has a 18 pack-year smoking history. He has quit using smokeless tobacco. His smokeless tobacco use included Snuff and Chew. He reports that he drinks about 12.6 oz of alcohol  per week. He reports that he does not use illicit drugs.  Allergies:  Allergies  Allergen Reactions  . Simvastatin Other (See Comments)    REACTION: pt states INTOL to ZOCOR w/ leg pains    No prescriptions prior to admission    No results found for this or any previous visit (from the past 48 hour(s)).  No results found.   Pertinent items are noted in HPI.  Height 5\' 11"  (1.803 m), weight 90.719 kg (200 lb).  General appearance: alert, cooperative and appears stated age Head: Normocephalic, without obvious abnormality Neck: no JVD Resp: clear to auscultation bilaterally Cardio: regular rate and rhythm, S1, S2 normal, no murmur,  click, rub or gallop GI: soft, non-tender; bowel sounds normal; no masses,  no organomegaly Extremities: pain antecubital fossa right arm Pulses: 2+ and symmetric Skin: Skin color, texture, turgor normal. No rashes or lesions Neurologic: Grossly normal Incision/Wound: na  Assessment/Plan X-rays are negative.   His MRI is reviewed revealing a rupture of his biceps tendon distally.  We have discussed with him possible repair of this vs conservative treatment. He is aware that he will lose full supination strength and full flexion strength with major loss of supination. Pre, peri and post op care are discussed along with risks and complications. Patient is aware there is no guarantee with surgery, possibility of infection, injury to arteries, nerves, and tendons, incomplete relief and dystrophy. He is advised of the possibility of arterial repairs, possibility of injury to the lateral antebrachial cutaneous nerve of the forearm with some discomfort with numbness and tingling in that area. We have discussed rehabilitation. He is aware that he is now between 4-6 weeks following his injury. He is aware there will be scar about the muscle that it has contracted somewhat and leaving this for a longer period of time increases the likelihood of loss of mobility. We will increase therapy in an effort to get his arm fully extended if it has to be repaired with any flexed attitude even up to 90 degrees. He would like to schedule this. He will be scheduled for distal biceps right elbow as an outpatient under regional anesthesia with retro-button.   Jedadiah Abdallah R 03/12/2015, 8:42 AM

## 2015-03-12 NOTE — Anesthesia Postprocedure Evaluation (Signed)
  Anesthesia Post-op Note  Patient: [name]  Procedure(s) Performed: Procedure(s): REPAIR/RECONSTRUCTION RIGHT BICEPS TENDON (Right)  Patient Location: PACU  Anesthesia Type:GA combined with regional for post-op pain  Level of Consciousness: awake, alert  and oriented  Airway and Oxygen Therapy: Patient Spontanous Breathing  Post-op Pain: none  Post-op Assessment: Post-op Vital signs reviewed              Post-op Vital Signs: Reviewed  Last Vitals:  Filed Vitals:   03/12/15 1315  BP: 123/61  Pulse: 54  Temp:   Resp: 21    Complications: No apparent anesthesia complications

## 2015-03-13 ENCOUNTER — Encounter (HOSPITAL_BASED_OUTPATIENT_CLINIC_OR_DEPARTMENT_OTHER): Payer: Self-pay | Admitting: Orthopedic Surgery

## 2015-03-13 NOTE — Op Note (Signed)
[name], [name] NO.:  [id number]  MEDICAL RECORD NO.:  [id number]  LOCATION:                                 FACILITY:  PHYSICIAN:  [name], M.D.            DATE OF BIRTH:  DATE OF PROCEDURE:  03/12/2015 DATE OF DISCHARGE:                              OPERATIVE REPORT   PREOPERATIVE DIAGNOSIS:  Ruptured biceps tendon distally, right forearm.  POSTOPERATIVE DIAGNOSIS:  Ruptured biceps tendon distally, right forearm.  OPERATION:  Repair of distal biceps tendon, right forearm.  SURGEON:  [name], M.D.  ASSISTANT:  [name] Cover, MD  ANESTHESIA:  Supraclavicular block, general.  ANESTHESIOLOGIST:  [name] Gerlach, MD  HISTORY:  The patient is a 74 year old male who suffered rupture of his biceps tendon, MRI proven.  He has elected to undergo repair of this. He is aware that there is no guarantee with the surgery, possibility of infection; recurrence of injury to arteries, nerves, tendons; incomplete relief of symptoms, and dystrophy, that repair of biceps tendons are not absolutely necessary, but it is his wish to undergo repair reconstruction of this.  In the preoperative area, the patient is seen, the extremity marked by both patient and surgeon.  Questions encouraged and answered to his satisfaction.  The extremity marked.  PROCEDURE IN DETAIL:  The patient was brought to the operating room. After supraclavicular block was carried out in the preoperative area, he underwent general anesthesia under the direction of Dr. Ola Love.  He was prepped using ChloraPrep, supine position with the right arm free. A 3-minute dry time was allowed.  Time-out taken, confirming the patient and procedure.  A transverse incision was made approximately 2 to 3 fingerbreadths distal to the flexion crease of the right elbow, carried down through subcutaneous tissue.  This was done after exsanguination of the limb with an Esmarch bandage and inflation of the  tourniquet to 250 mmHg.  A time-out had been taken confirming the procedure.  The wound was deepened with blunt and sharp dissection.  Bleeders electrocauterized as necessary with bipolar.  The dissection carried down to a very thick scar proceeding down.  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  The lateral antebrachial cutaneous nerve of the forearm was identified in the scar, this was dissected free.  The median nerve brachial artery was identified proximally.  The lacertus was then cut.  The scar which proceeded down to the radial tubercle was freed and subsequently detached from the tubercle of the radius.  The scar surrounding the tendon was then dissected free.  The muscle freed proximally.  The whipstitch was then placed with a fiber loop, this was brought out through the end of the tendon.  A drill hole was placed for retro button with plan on placement through one cortex only.  Drill hole was made centrally after placement of retractors in the tubercle of the radius attempting to place this centrally and slightly toward the ulna.  The hole was deepened with a House curette. The retro button was then placed into the depths bringing the suture and tendon into position.  X-rays confirmed that  the retro button had deployed properly stabilizing it against proximal and distal cortices in the medullary canal of the radius.  The biceps tendon was then drawn into the hole along the margin of the radial tubercle and a finishing stitch using the FiberWire was then used to circulate this in position. This was done on 2 throws.  The arm was able to be repaired in full extension.  Wound was copiously irrigated with saline.  The superficial veins had been torn, these were hemo-clipped.  The subcutaneous tissue was then closed with interrupted 4-0 Vicryl and the skin with interrupted 4-0 nylon sutures.  Sterile compressive dressing, long-arm splint was applied.  On deflation  of the tourniquet, his fingers pinked. He was taken to the recovery room for observation in a satisfactory condition.  He will be discharged home to return to the [place] in 1 week on Percocet.          ______________________________ [name], M.D.     GK/MEDQ  D:  03/12/2015  T:  03/13/2015  Job:  [id number]

## 2015-03-15 ENCOUNTER — Other Ambulatory Visit: Payer: Self-pay | Admitting: Pulmonary Disease

## 2015-05-26 ENCOUNTER — Other Ambulatory Visit: Payer: Self-pay | Admitting: Pulmonary Disease

## 2015-06-01 ENCOUNTER — Encounter: Payer: Self-pay | Admitting: Pulmonary Disease

## 2015-06-01 ENCOUNTER — Ambulatory Visit (INDEPENDENT_AMBULATORY_CARE_PROVIDER_SITE_OTHER): Payer: Medicare Other | Admitting: Pulmonary Disease

## 2015-06-01 VITALS — BP 132/84 | HR 65 | Temp 97.7°F | Wt 202.6 lb

## 2015-06-01 DIAGNOSIS — Z23 Encounter for immunization: Secondary | ICD-10-CM

## 2015-06-01 DIAGNOSIS — I2510 Atherosclerotic heart disease of native coronary artery without angina pectoris: Secondary | ICD-10-CM

## 2015-06-01 DIAGNOSIS — K5730 Diverticulosis of large intestine without perforation or abscess without bleeding: Secondary | ICD-10-CM

## 2015-06-01 DIAGNOSIS — I10 Essential (primary) hypertension: Secondary | ICD-10-CM

## 2015-06-01 DIAGNOSIS — F411 Generalized anxiety disorder: Secondary | ICD-10-CM

## 2015-06-01 DIAGNOSIS — J410 Simple chronic bronchitis: Secondary | ICD-10-CM | POA: Diagnosis not present

## 2015-06-01 DIAGNOSIS — E785 Hyperlipidemia, unspecified: Secondary | ICD-10-CM

## 2015-06-01 DIAGNOSIS — M159 Polyosteoarthritis, unspecified: Secondary | ICD-10-CM

## 2015-06-01 DIAGNOSIS — M150 Primary generalized (osteo)arthritis: Secondary | ICD-10-CM

## 2015-06-01 DIAGNOSIS — C61 Malignant neoplasm of prostate: Secondary | ICD-10-CM

## 2015-06-01 DIAGNOSIS — K219 Gastro-esophageal reflux disease without esophagitis: Secondary | ICD-10-CM

## 2015-06-01 DIAGNOSIS — M8949 Other hypertrophic osteoarthropathy, multiple sites: Secondary | ICD-10-CM

## 2015-06-01 MED ORDER — OXYCODONE-ACETAMINOPHEN 10-325 MG PO TABS: 1.0000 | ORAL_TABLET | ORAL | Status: DC | PRN

## 2015-06-01 MED ORDER — ALPRAZOLAM 0.5 MG PO TABS: ORAL_TABLET | ORAL | Status: DC

## 2015-06-01 NOTE — Patient Instructions (Signed)
Today we updated your med list in our EPIC system...    Continue your current medications the same...  Please return to our lab one morning this week for your FASTING blood work...    We will contact you w/ the results when available...   We gave you the 2016 flu vaccine today...  Call for any questions...  Let's plan a follow up visit in 99mo, sooner if needed for problems.Marland KitchenMarland Kitchen

## 2015-06-01 NOTE — Progress Notes (Signed)
Subjective:     Patient ID: [name], male   DOB: 15-Aug-1940, 74 y.o.   MRN: [id number]  HPI 74 y/o WM here for a follow up visit... he has multiple medical problems as noted below...   ~  SEE PREV EPIC NOTES FOR OLDER DATA >>   ~  February 05, 2013:  37yrROV>  VTrinidadreturns after a long hiatus- being cared for by DC[employer] Communicationsfor Cards, & DrGrapey for Urology;  He has numerous medical issues- addressed below, but returns asking for a nerve pill describing lots of stress w/ his business etc; we discussed trial [id number] as needed...     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    HBP> on Aten25, Lisin10;  BP= 142/78 & wt=199# (same as 2011); he denies CP, palpit, SOB, dizzy, edema, etc...    CAD> rec to take ASA81; followed by DrHochrein & last seen 10/13- stable, no changes made...    Dyslipidemia> on Prav20;  FLP 6/14 showed TChol 169, TG 242, HDL 31, LDL 93 & we reviewed diet, exercise, & rec adding FENOFIBRATE 16064m...     GI- GERD, Divertics> on Omep40;  He denies abd pain, dysphagia, n/v, c/d, blood seen; last colon by DrPatterson was 2004 & he is due for f/u procedure...     Hx Prostate Cancer> followed by DrGrapey but pt missed his last appt & is due now- Hx prostate ca 2007 w/ IMRT & hormone therapy x2yr5yrlowly rising PSA per Urology but Lab 6/14 showed PSA= 3.21    DJD> on OTC analgesics as needed; he has seen DrDaldorf in the past for left knee pain...    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 We reviewed prob list, meds, xrays and labs> see below for updates >>   LABS 6/14:  FLP- chol ok but TG=242 HDL=31;  Chems- wnl;  CBC- wnl;  TSH=1.28;  PSA=3.21 (prev in 2011 it was 0.57)...  ~  August 13, 2013:  70mo R71mo [name] has gained 9# up to 208# today, he blames  it on quitting chewing tobacco but I told him the trade-off was worth it, asked to get on diet & incr exercise... He has no new complaints or concerns...    Breathing is stable but needs to incr exercise program...    BP is controlled w/ Aten25 & Lisin10;  BP=128/78 today & he denies CP, palpit, SOB, edema...    He saw DrHochrein 10/14- on above + ASA; stable & asymptomatic, no changes made, f/u 93yr...36yrLipids treated w/ Prav20 & diet but he's gained wt as noted; intol to Fenofib previously- we reviewed diet, exercise, wt reduction strategies...    He saw DrGrapey for prostate Ca follow up 8/14> treated w/ XRT but slowly rising PSA since then, they are on Observation protocol w/ check ups every 70mo... 1moeviewed prob list, meds, xrays and labs> see below for updates >> Given PREVNAR-13 & Rx for Shingles vaccine...   EKG 10/14 by DrHochrein showed SBrady, rate52, borderline EKG, NAD...   ADDENDUM> PSA 2/15 by DrGrapey was 4.26...   ~  February 10, 2014:  70mo ROV 370moVance indAngellos that he is doing well, no new complaints or concerns;  He has a rising PSA- followed by DrGrapey Q70mo w/89mo  PSADT est ~360mo& last measured 4.26 in Feb2015;  He saw Urology 3/15 & note reviewed (prev hx well outlined) and plan was to continue monitoring Q658montil PSA ~10 then consider hormone therapy...  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    HBP> on Aten25, Lisin10;  BP= 132/82 & wt=203# (down 5# last 60m1mohe denies CP, palpit, SOB, dizzy, edema, etc...     CAD> on ASA81; followed by DrHochrein & last seen 10/14- remains stable, no changes made, f/u 48yr4yr   Dyslipidemia> on Prav20; hx intol to Fenofibrate; FLP 6/14 showed TChol 169, TG 242, HDL 31, LDL 93 & we reviewed diet, exercise, & wt reduction...    GI- GERD, Divertics> on Omep40;  He denies abd pain, dysphagia, n/v, c/d, blood seen; last colon by DrPatterson was 8/14 & showed mod divertics, no polyps, and he rec f/u colon for routine risk ~37yr40yrHx Prostate Cancer> followed by DrGrapey w/ hx prostate ca 2007 w/ IMRT & hormone therapy x2yrs;70yrhas had slowly rising PSA= 3.21 [id number]= 4.26 Feb201[name] PSA= 6.15 (SEE BELOW)    DJD> on OTC analgesics as needed; he has seen DrDaldorf in the past for left knee pain...    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 We reviewed prob list, meds, xrays and labs> see below for updates >>   CXR 7/15 shows norm heart size & Ao atherosclerosis, bilat pulm nodules are visualized (new since 2/11 CXR) w/ largest ~2cm in LUL...    LABS 7/15:  FLP- not at goals w/ TG elev & HDL low;  Chems- ok x BS=135;  CBC- wnl;  TSH=1.38;  PSA=6.15...  CT Chest/ Abd/ Pelvis 7/15 showed numerous bilat pulm nodules; no signif findings in abd/pelvis x gallstones, adrenal adenoma, scat divertics, and atherosclerotic changes; degen changes in spine PLAN>  CXR shows recurrent lung nodules (assoc w/ his rising PSA); prev noted nodules from 2007 were assoc w/ PSA of 10.6 at time of Dx, and the CXR cleared w/o nodules seen in 2008, 2009, 2011; he has been followed by Urology w/ slowly rising PSA at 3.1 last yr and 4.26 earlier this yr, and now=6.15   With f/u CXR showing recurrent mult bilat pulm nodules (also seen at time of Prostate cancer Dx in 2007 & resolved (2008, 2009, 2011) after XRT & hormone therapy at time of dx...   PET Scan 02/20/14 showed numerous bilat noncalcif pulm nodules, 5 measured w/ max SUV  1.6-4.6, coronary atherosclerosis, no skeletal metssmall right adrenal nodule (adenma favored)...  Nuclear Bone Scan 02/27/14 showed focal areas of uptake in spine (most likely degenerative) & knee/ shoulder- no convincing evid for mets to bone...  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  PLAN>  He has upcoming appt w/ DrGrapey for Urology   ~  June 18, 2014:  31moROV & VBraysaw DrGrapey 9/15 who agreed w/ our Dx of an unusual case of Prostate Cancer w/ mult lung metastasis; they started Rx w/ Lupron (planning shots Q420moand used Casodex for the 1st 30d only; he has f/u appt in Jan2016 w/ Urology for PSA & Testos levels; by his account he has been tolerating the Rx well w/o new issues until last week...  He states that on 11/23 he had a "head cold & chest congestion" so he went to the local [facility] & was evaluated & given Omnicef & a steroid shot in right buttock; on 11/24 he awoke w/ pain in his right leg (?hip area & thigh area mostly) & states he "couldn't walk" due to the pain & numbness; on 11/25 he went to his Chiro w/ XRays done (later told they showed "alot of issues in my lower back"); he has an inversion table he's used on & off for yrs but hasn't been on it recently; 11/27 was Thanksgiving & on 11/28 he was hurting so much [facility]> XRays of Lumbar sp w/ extensive degen changes, XRay of right hip w/ min degen arthritis, VenDopplers neg for DVT, no rash presenty, sl tender right hip but good ROM> they told him likely siatica- given Pred taper & Percocet5 which he has taken ~3/d & today notes sl improved...  Still no rash present & exam is unchanged from what is described- min tender, good ROM, essent neg SLR, symmetric reflexes and strength; note- [facility]...  Daughter requests that he see DrLucey for Ortho eval &  we will try to expedite this ASAP for pt...     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     Prostate Cancer w/ lung mets> 9/15 OV by DrGrapey reviewed; pt was started on Lupron shots (planned Q4mo633mogiven Casodex x30d to start; pt has f/u visit sched for [id number] labs (PSA & Testos) and 2nd Lupron injection... We reviewed prob list, meds, xrays and labs> see below for updates >>   CXR 12/15 showed signif improvement w/ decr in size & number of nodules... PLAN>> now w/ new onset of right leg pain- primarily right thigh area w/ some burning as well; no rash present & asked to watch for vesicular eruption & call if any changes; this is the vicinity of meralgia paresthetica & we discussed trial GABAPENTIN start 100mg33m; continue Pred taper given in ER and refilled the Oxycodone5 Tid + constip prophylactic regime w/ Miralax daily & Senakot-S Qhs... Plan ROV 6wks...  ~  July 30, 2014:  6wk ROV & VanceNorwins me he went to an Ortho in [place] who did MRI and referred him to NS- DrJenkins, told him he could do surg but advised holding off since his pain level is diminished, still taking the Neurontin100Tid & Oxycodone5 prn; we do not have notes from these consultants & he is asked to get records sent to us to[place]oreac[place] into Epic...     VanceBrinton/o a URI w/ cough, beige sput, sinus drainage, & chest soreness from the cough; he denies f/c/s, denies incr SOB, etc; we decided to treat w/ ZPak, Hycodan, Mucinex, fluids...     He has f/u appt w/ Urology, DrGrapey soon; as noted his last CXR 12/15 showed signif improvement w/ decr in size & number  of nodules (believed to be metastatic prostate cancer)...  We reviewed prob list, meds, xrays and labs> see below for updates >>  PLAN>> we decided to treat w/ ZPak, Hycodan, Mucinex, fluids; he will f/u in 89mow/ repeat CXR...  ~  Nov 28, 2014:  41moOV & VaLemontreports that is breathing is good, no problems reported- denies cough, sput, SOB, CP, etc;  His CC remains LBP- pt says MRI reveqaled bulging discs & he is sched for an epid steroid shot per DrJenkins (we still do [place]. He continues to f/u w/ Urology- DrGrapey & pt indicates that he's due for lab work next week & a hormone shot after that- again he is asked to request records be sent to usKorearom his specialists for usKoreao review & so these records will be avail in EPIC...  EXAM reveals Afeb, VSS, O2sat=99% on RA;  HEENT- neg, mallampati2;  Chest- clear w/o w/r/r;  Heart- RR w/o m/r/g;  Ext- neg w/o c/c/e...   CXR 5/16 shows regression of the bilat lung nodules & now resolved, norm heart size, NAD...  PLAN>>  VaIsaiss stable from the pulmonary standpoint; he has mult specialists attending his various problems & he is reminded to get records sent to us[place] review & scan into Epic...   ~  June 01, 2015:  57m72moV & VanCleophusports that he is doing well- he remains on Lupron shots per DrGrapey- last seen 04/13/15 & note reviewed- he's had a great response to the Lupron w/ PSA falling to zero & pulmonary metastatic prostate cancer nodules melting away; tolerating the shots well...  He had a ruptured bicep tendon in right arm repaired by DrKuzma 02/2015... 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    HBP> on Aten25, Lisin10;  BP= 132/84 & wt=203# (stable); he denies CP, palpit, SOB, dizzy, edema, etc...    CAD> on ASA81; followed by DrHochrein & last  seen 10/15- remains stable, no changes made, overdue for yearly f/u visit w/ Cards...    Dyslipidemia> on Prav20; hx intol to Fenofibrate; FLP 11/16 showed TChol 169, TG 292, HDL 34, LDL 81 & we reviewed better diet, exercise, & wt reduction...    GI- GERD, Divertics> on Omep40;  He denies abd pain, dysphagia, n/v, c/d, blood seen; last colon by DrPatterson was 8/14 & showed mod divertics, no polyps, and he rec f/u colon for routine risk ~10y25yr Hx Prostate Cancer w/ lung mets> followed by DrGrapey w/ hx prostate ca 2007 w/ IMRT & hormone therapy x2yrs62yr has had slowly rising PSA= 3.21 [id number]= 4.26 Feb20OTL5726= 6.15 Jul2015=> Dx w/ metastatic ca to lungs; treated by DrGrapey w/ Lupron shots and PSA et to zero w/ resolution of the pulm nodules...    DJD> on OTC analgesics as needed; he has seen DrDaldorf in the past for left knee pain...    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 EXAM reveals Afeb, VSS, O2sat=98%  on RA;  HEENT- neg, mallampati2;  Chest- clear w/o w/r/r;  Heart- RR w/o m/r/g;  Abd- soft, nontender, neg;  Ext- neg w/o c/c/e...   LABS 05/2015>  FLP- ok on Prev20 x TG=292;  Chems- ok x BS=147;  CBC- wnl;  TSH=1.41... PSAs checked by DrGrapey 7 reported to be= zero... IMP/PLAN>>  Amon is stable on current regimen;  Given 2016 Flu vaccine; rec to continue same and we plan ROV in 39mo..          Problem List:    BRONCHITIS, RECURRENT (ICD-491.9) - no recent URI symptoms... 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 ~  CXR 2007 when coronary stent placed showed ?sm nodules seen... ~  CT Chest 8/07 showed bilat noncalcif lung nodules, varying size in all lobes & largest= 9.454min LLL, no adenopathy... note: PSA=10.6 ~  CT Abd&Pelvis 9/07 showed mult sm lung nodules at bases bilat, gallstones, sm duod divertic, scat atherosclerotic calcif, no mass or  adenop, asymmetric enlarged prostate...  ~  CT Chest 12/07 showed mult pulm nodules bilat- no change, no adenopathy, coronary calcif...  ~  f/u CXR 8/08 without nodules seen... (note: PSA= 0.03) ~  f/u CXR 11/09 clear, no lesions seen... ~  f/u CXR 2/11 showed clear, NAD...Marland Kitchen(note: PSA= 0.57) ~  CXR 7/15 shows norm heart size & Ao atherosclerosis, bilat pulm nodules are visualized (new since 2/11 CXR) w/ largest ~2cm in LUL... 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 ~  CXR 12/15 (after Lupron shot & 33m53mo Casodex) showed signif improvement w/ decr in size & number of nodules ~  1/16: he returned w/ URI- cough, sput, chest soreness and we decided to treat w/ ZPak, Hycodan, Mucinex, fluids... ~  CXR 5/16 shows resolution of the prev metastatic nodules, heart size is wnl, NAD...   HYPERTENSION (ICD-401.9) - contolled on ATENOLOL 15m27m & LISINOPRIL 10mg26m.  ~  9/11:  BP=110/62 here and usually 120s/ 70s at home- denies HA, fatigue, visual changes, CP, palipit, dizziness, syncope, dyspnea, edema, etc... ~  7/14:  on Aten25, Lisin10;  BP= 142/78 & wt=199# (same as 2011); he denies CP, palpit, SOB, dizzy, edema, etc. ~  7/15: on Aten25, Lisin10;  BP= 132/82 & wt=203# (down 5# last 65mo);65moremains asymptomatic... ~  12/15: on Aten25, Lisin10; BP= 140/90 & wt=202#; he denies CP, palpit, SOB, edema... ~  5/16: on Aten25, Lisin10; BP= 140/90 & he is asymptomatic... Reminded to elim sodium.  CAD (ICD-414.00) - followed by DrHochrein... on ASA 315mg/d3m NuclearStressTest 8/07 abnormal w/ ant ischemia... ~  cath 8/07 showed 95% mid-LAD stenosis, & 20% lesions in the other 2 vessels, EF=60%... subseq PTCA w/ non-drug eluting stent... ~  OV w/ Cards= 5/10 & note reviewed- BP sl elevated & Lisinopril added. ~  He saw DrHochrein 10/13> doing well, no new symptoms, exam was neg, no change in meds &  rec aggressive risk factor reduction strategy...  ~  EKG 10/13 showed NSR, rate62, LAD, otherw wnl, NAD... ~  He saw DrHochrein 10/14- on above + ASA; stable & asymptomatic, no changes made, f/u 59yr ~  55yraw DrHochrein 10/15> HBP, CAD, HL- stable w/o CP/ palpit/ SOB/ edema/ etc (despite being diagnosed w/ met prostate ca to lungs)...  DYSLIPIDEMIA (ICD-272.4) - on PRAVACHOL 20mg/d57m  and tol OK...  ~  [place] 8/08 showed TChol 168, TG 143, HDL34, LDL105 ~  FLP 2/09 showed TChol 163, TG 207, HDL 34, LDL 91 ~  FLP 5/10 showed TChol 148, TG 131, HDL 33, LDL 89 ~  FLP 9/11 showed TChol 163, TG 140, HDL 42, LDL 93 ~  FLP 6/14 on Prav20 showed TChol 169, TG 242, HDL 31, LDL 93  ~  FLP 7/15 on Prav20 showed TChol 174, TG 290, HDL 31, LDL 87... We reviewed low fat diet 7 need for wt reduction.  GERD (ICD-530.81) - prev on OMEP20 but causes diarrhea he says, and ACIPHEX 24m/d works better...  ~  last EGD (DrPatterson)was 8/04 showing GERD & stricture dilated... ~  5/11: presented to GI w/ recurrent dysphagia- EGD showed 3cmHH, stricture dilated, no Barrett's, changed to Aciphex. ~  On Omep40 & he remains asymptomatic w/o dysphagia, CP, abd pain, n/v, c/d, blood seen...  DIVERTICULOSIS OF COLON (ICD-562.10) >>  ~  Colonoscopy 8/04 by DrPatterson was normal, f/u 169yr(scattered tics seen on 1996 flex) ~  7/14:  He is due for f/u colon 7 we will refer the chart to DrPatterson for review... ~  Colonoscopy 8/14 by DrPatterson showed mod divertics, no polyps, and he rec f/u colon for routine risk ~1017yr.  PROSTATE CANCER (ICD-185) - followed by DrPeterson- elevated PSA found 8/07 (10.6)... biopsies were pos (Stage T2b, bilat dis w/ Gleason4+3=7 on one side & 8 on the other) & there is a family hx as well; he had bilat pulm nodules on CXR & CT Chest at diagnosis... [facility], then [facility] Rx for 2 yrs... [facility]... prev on TRELSTAR injections q  63mo54monthPROVERA for the hot flashes>> x2yrs26yreg bone scan 1/08;  PSA here= 0.03 in 2008, and CXR cleared w/o nodules seen in 2008, 2009, 2011 (see above)... ~  2/11: we don't have recent note from DrPetGlasgow will request info sent to us atKoreaext visit. ~  9/11: labs here showed PSA= 0.57 & we will forward to DrPetOhio/13:  He had f/u visit w/ DrGrapey> PSA had been slowly rising and ~1 when they last checked; DrGrapey rec Q6mo f56mo~  7/14:  Pt missed his 3/14 appt w/ Urology & is sched to see them 8/14; PSA recorded 6/14 = 3.21 & we sent copy to DrGrapey... ~  3/15:  He had f/u DrGrapey> prostate Ca dx 2007, [facility] 68yr[facility] in 2008; slowly rising PSA w/ doubling time ~69mo no59moGrapey follows pt Q6mo & pl87mos for hormone therapy when PSA>10... ~  7/15:  Routine CXR showed return of bilat pulm nodules assoc w/ PSA= 6.15  (CT Chest, Abd, Pelvis is planned & discussion w/ DrGrapey & Oncology)=> Dx w/ metastatic prostate adenocarcinoma (lung mets) ~  9/15: he was started on Lupron shots Q4mo, give52mosodex daily x30d per DrGrapey; f/u OV planned Jan2016...[id number]: CXR showed improvement w/ decr in size & number of lung nodules... He has a follow up appt w/ DrGrapey in Jan2016.  DEGENERATIVE JOINT DISEASE (ICD-715.90) >>  RIGHT THIGH AREA PAIN 12/15 >> ?etiology, [facility] & by [facility]... ~  his left knee gives him some pain on and off; he saw DrDaldorf for this 12/08 & he thought there might be a torn meniscus, they  decided to Rx w/ NAPROSEN which helps; may yet need MRI/ arthroscopy... ~  2/11: discussed trial of Mobic to see if this works for him. ~  12/15: presented w/ right thigh area pain ?etiology after eval at [facility], by [employer], by [employer]; XRays w/ extensive degen changes in lumbar spine; sl improved w/ Pred & Percocet from [place]; no rash seen, ?could this  be meralgia paresthetica? They want to see DrLucey for Ortho eval, we will give trial GABAPENTIN100Tid, watch for rash (he had Shingles vaccine 2 mo ago)... ~  1/16: Taber tells me he went to an Ortho in [place] who did MRI and referred him to NS- DrJenkins, told him he could do surg but advised holding off since his pain level is diminished, still taking the Neurontin100Tid & Oxycodone5 prn; we do not have notes from these consultants & he is asked to get records sent to [place] to scan into Epic.  HEALTH MAINTENANCE >>  ~  GI>  Followed by [facility] & last colon was 2004- f/u due now... ~  GU>  Followed by DrGrapey & seen every 48mofor PSA recheck but he missed the 3/14 appt & our PSA 6/14= 3.21 (copy to Urology & he has appt 8/14)... ~  Immuniz>  He gets the yearly flu vax (last 10/13);  Had Pneumovax several yrs ago;  He received the PREVNAR-13 vax 1/15; Not sure of last Tetanus vaccine; Asking about the shingles shot=> Rx written [facility]...   Past Surgical History  Procedure Laterality Date  . Eyelid surgery for lid lag    . Foot surgery    . Hydrocele excision / repair    . Lung nodules    . Mastectomy      benign tumor  . Prostate treatment      cancer  . Skin cancer excision  2012    skin cancer on right ear  . Inguinal hernia repair      74yrs old  . Distal biceps tendon repair Right 03/12/2015    Procedure: REPAIR/RECONSTRUCTION RIGHT BICEPS TENDON;  Surgeon: GDaryll Brod MD;  [place] [facility]  Service: Orthopedics;  Laterality: Right;    Outpatient Encounter Prescriptions as of 06/01/2015  Medication Sig  . ALPRAZolam (XANAX) 0.5 MG tablet TAKE ONE-HALF TO 1 TABLET BY MOUTH THREE TIMES DAILY AS NEEDED ANXIETY  . aspirin 81 MG tablet Take 81 mg by mouth daily.  .Marland Kitchenatenolol (TENORMIN) 25 MG tablet TAKE 1 TABLET BY MOUTH EVERY DAY  . b complex vitamins tablet Take 1 tablet by mouth daily.  . Calcium-Magnesium-Vitamin D  (CALCIUM 1200+D3 PO) Take 1 tablet by mouth daily.  . Cyanocobalamin (VITAMIN B-12 PO) Take 1 tablet by mouth daily.  .Marland KitchenGLUCOSAMINE SULFATE PO Take 1 tablet by mouth 2 (two) times daily.  .Marland Kitchenlisinopril (PRINIVIL,ZESTRIL) 10 MG tablet TAKE 1 TABLET BY MOUTH EVERY DAY  . Multiple Vitamins-Minerals (MULTIVITAMIN PO) Take 1 tablet by mouth daily.  . Omega-3 Fatty Acids (FISH OIL) 1000 MG CAPS Take 1,000 mg by mouth daily.  .Marland Kitchenomeprazole (PRILOSEC) 40 MG capsule TAKE ONE CAPSULE BY MOUTH EVERY DAY  . oxyCODONE-acetaminophen (PERCOCET) 10-325 MG tablet Take 1 tablet by mouth every 4 (four) hours as needed for pain.  . pravastatin (PRAVACHOL) 20 MG tablet TAKE 1 TABLET BY MOUTH EVERY DAY  . RABEprazole (ACIPHEX) 20 MG tablet Take 20 mg by mouth daily.    Allergies  Allergen Reactions  . Simvastatin Other (  See Comments)    REACTION: pt states INTOL to ZOCOR w/ leg pains    Current Medications, Allergies, Past Medical History, Past Surgical History, Family History, and Social History were reviewed in [employer] record.   Review of Systems         See HPI - The patient denies anorexia, fever, weight loss, weight gain, vision loss, decreased hearing, hoarseness, chest pain, syncope, dyspnea on exertion, peripheral edema, prolonged cough, headaches, hemoptysis, abdominal pain, melena, hematochezia, severe indigestion/heartburn, hematuria, incontinence, muscle weakness, suspicious skin lesions, transient blindness, difficulty walking, depression, unusual weight change, abnormal bleeding, enlarged lymph nodes, and angioedema.     Objective:   Physical Exam    WD, WN, 74 y/o WM in NAD... GENERAL:  Alert & oriented; pleasant & cooperative... HEENT:  [place]/AT, EOM-wnl, PERRLA, EACs-clear, TMs-wnl, NOSE-clear, THROAT-clear & wnl. NECK:  Supple w/ fairROM; no JVD; normal carotid impulses w/o bruits; no thyromegaly or nodules palpated; no lymphadenopathy. CHEST:  Clear to P & A;  without wheezes/ rales/ or rhonchi heard... HEART:  Regular Rhythm; without murmurs/ rubs/ or gallops detected... ABDOMEN:  Soft & nontender; normal bowel sounds; no organomegaly or masses palpated... EXT: without deformities, mild arthritic changes; no varicose veins/ venous insuffic/ or edema.     c/o pain in right thigh area- good ROM hip, neg SLR, reflexes symmetric, no rash apparent... NEURO:  CN's intact; motor testing normal; sensory testing normal; gait normal & balance OK. DERM:  No lesions noted; no rash etc...  RADIOLOGY DATA:  Reviewed in the EPIC EMR & discussed w/ the patient...  LABORATORY DATA:  Reviewed in the EPIC EMR & discussed w/ the patient...   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 9/15> DrGrapey started treatment w/ Lupron [id number] x30d at outset... 12/15> CXR already shows signs of improvement w/ decr size & number of lesions... 5/16> CXR nodules have resolved w/ ongoing treatment for his prostate cancer...  HBP>  Stable on low dose Aten & Lisin, continue same...  CAD>  Followed by DrHochrein; seen last 10/14 & he remains stable, no changes made; needs to incr his exercise program...  Dyslipidemia>  On Prav20 but TG & HDL deranged; needs better diet, exercise, & wt reduction...  GI- GERD, Diverics, needs f/u colon>  F/u colon 8/14 was neg x divertics, no polyps, f/u rec for 1042yr.  Prostate Ca>  Managed by DrGrapey as noted; rising PSA indicates recurrent dis and mult lung nodules represent metastatic dis...  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 Right leg pain ?etiology> we treated empirically w/ Neurontin & prn oxycod; he went to see Ortho in [place], MRI done, referred to NS- DrJJenkins & we will call for his notes...  Anxiety>  He is requesting Rx- try ALPRAZOLAM 0.5mg57md as needed...      Plan:     Patient's Medications  New Prescriptions   No medications on file  Previous Medications   ASPIRIN 81 MG TABLET    Take 81  mg by mouth daily.   ATENOLOL (TENORMIN) 25 MG TABLET    TAKE 1 TABLET BY MOUTH EVERY DAY   B COMPLEX VITAMINS TABLET    Take 1 tablet by mouth daily.   CALCIUM-MAGNESIUM-VITAMIN D (CALCIUM 1200+D3 PO)    Take 1 tablet by mouth daily.   CYANOCOBALAMIN (VITAMIN B-12 PO)    Take 1 tablet by mouth daily.   GLUCOSAMINE SULFATE PO    Take 1 tablet by mouth 2 (two) times daily.   LISINOPRIL (PRINIVIL) 61m TABLET     TAKE 1 TABLET BY MOUTH EVERY DAY   MULTIPLE VITAMINS-MINERALS (MULTIVITAMIN PO)    Take 1 tablet by mouth daily.   OMEGA-3 FATTY ACIDS (FISH OIL) 1000 MG CAPS    Take 1,000 mg by mouth daily.   OMEPRAZOLE (PRILOSEC) 40 MG CAPSULE    TAKE ONE CAPSULE BY MOUTH EVERY DAY   PRAVASTATIN (PRAVACHOL) 20 MG TABLET    TAKE 1 TABLET BY MOUTH EVERY DAY   RABEPRAZOLE (ACIPHEX) 20 MG TABLET    Take 20 mg by mouth daily.  Modified Medications   Modified Medication Previous Medication   ALPRAZOLAM (XANAX) 0.5 MG TABLET ALPRAZolam (XANAX) 0.5 MG tablet      TAKE ONE-HALF TO 1 TABLET BY MOUTH THREE TIMES DAILY AS NEEDED ANXIETY    TAKE ONE-HALF TO 1 TABLET BY MOUTH THREE TIMES DAILY AS NEEDED ANXIETY   OXYCODONE-ACETAMINOPHEN (PERCOCET) 10-325 MG TABLET oxyCODONE-acetaminophen (PERCOCET) 10-325 MG per tablet      Take 1 tablet by mouth every 4 (four) hours as needed for pain.    Take 1 tablet by mouth every 4 (four) hours as needed for pain.  Discontinued Medications

## 2015-06-03 ENCOUNTER — Telehealth: Payer: Self-pay | Admitting: *Deleted

## 2015-06-03 NOTE — Telephone Encounter (Signed)
[name] from [facility] [phone] called and the alprazolam 0.5 mg was denied because he has to try two alternatives first, one alternative is lorazepam.  Member has been notified.

## 2015-06-03 NOTE — Telephone Encounter (Signed)
Initiated PA for Alprazolam thru [facility]. Key: [id number] Sent for review. Will await response.

## 2015-06-05 NOTE — Telephone Encounter (Signed)
Dr. Lenna Gilford please advise if you're ok with switching pt to Lorazepam since Alprazolam has been denied d/t needing to try an alternative med first.  Thanks!

## 2015-06-08 NOTE — Telephone Encounter (Signed)
Per SN>> Ok to switch pt to Lorazepam 1.0mg  #90 with instruction to take 1/2-1 tablet PO TID PRN anxiety. Call pt to notify of medication change  LM for pt to call back to discuss

## 2015-06-09 NOTE — Telephone Encounter (Signed)
LMTCB x2 for pt 

## 2015-06-10 MED ORDER — LORAZEPAM 1 MG PO TABS: ORAL_TABLET | ORAL | Status: DC

## 2015-06-10 NOTE — Telephone Encounter (Signed)
Called pt home # and received VM--LMTCB Called mobile # [email or web address]

## 2015-06-10 NOTE — Telephone Encounter (Signed)
Pt returning call can be reached @   [phone].[name]

## 2015-06-10 NOTE — Telephone Encounter (Signed)
LM for pt to return call @ [phone] .

## 2015-06-10 NOTE — Telephone Encounter (Signed)
Rx called in to pharmacy. Pt aware. Nothing further needed.  

## 2015-06-12 ENCOUNTER — Other Ambulatory Visit (INDEPENDENT_AMBULATORY_CARE_PROVIDER_SITE_OTHER): Payer: Medicare Other

## 2015-06-12 DIAGNOSIS — I10 Essential (primary) hypertension: Secondary | ICD-10-CM | POA: Diagnosis not present

## 2015-06-12 DIAGNOSIS — E785 Hyperlipidemia, unspecified: Secondary | ICD-10-CM

## 2015-06-12 DIAGNOSIS — I2510 Atherosclerotic heart disease of native coronary artery without angina pectoris: Secondary | ICD-10-CM

## 2015-06-12 DIAGNOSIS — J410 Simple chronic bronchitis: Secondary | ICD-10-CM

## 2015-06-12 LAB — HEPATIC FUNCTION PANEL
ALBUMIN: 4.1 g/dL (ref 3.5–5.2)
ALT: 25 U/L (ref 0–53)
AST: 18 U/L (ref 0–37)
Alkaline Phosphatase: 70 U/L (ref 39–117)
BILIRUBIN TOTAL: 0.5 mg/dL (ref 0.2–1.2)
Bilirubin, Direct: 0 mg/dL (ref 0.0–0.3)
TOTAL PROTEIN: 6.7 g/dL (ref 6.0–8.3)

## 2015-06-12 LAB — CBC WITH DIFFERENTIAL/PLATELET
BASOS ABS: 0 10*3/uL (ref 0.0–0.1)
BASOS PCT: 0.6 % (ref 0.0–3.0)
EOS ABS: 0.3 10*3/uL (ref 0.0–0.7)
Eosinophils Relative: 4.5 % (ref 0.0–5.0)
HEMATOCRIT: 42.6 % (ref 39.0–52.0)
HEMOGLOBIN: 14.3 g/dL (ref 13.0–17.0)
LYMPHS PCT: 37.4 % (ref 12.0–46.0)
Lymphs Abs: 2.8 10*3/uL (ref 0.7–4.0)
MCHC: 33.7 g/dL (ref 30.0–36.0)
MCV: 93.7 fl (ref 78.0–100.0)
Monocytes Absolute: 0.6 10*3/uL (ref 0.1–1.0)
Monocytes Relative: 8.5 % (ref 3.0–12.0)
Neutro Abs: 3.6 10*3/uL (ref 1.4–7.7)
Neutrophils Relative %: 49 % (ref 43.0–77.0)
Platelets: 180 10*3/uL (ref 150.0–400.0)
RBC: 4.55 Mil/uL (ref 4.22–5.81)
RDW: 13.1 % (ref 11.5–15.5)
WBC: 7.4 10*3/uL (ref 4.0–10.5)

## 2015-06-12 LAB — BASIC METABOLIC PANEL
BUN: 18 mg/dL (ref 6–23)
CHLORIDE: 105 meq/L (ref 96–112)
CO2: 28 mEq/L (ref 19–32)
CREATININE: 0.93 mg/dL (ref 0.40–1.50)
Calcium: 9.7 mg/dL (ref 8.4–10.5)
GFR: 84.47 mL/min (ref >60.00)
GLUCOSE: 147 mg/dL — AB (ref 70–99)
Potassium: 4.7 mEq/L (ref 3.5–5.1)
Sodium: 142 mEq/L (ref 135–145)

## 2015-06-12 LAB — LIPID PANEL
CHOL/HDL RATIO: 5
CHOLESTEROL: 169 mg/dL (ref 0–200)
HDL: 33.5 mg/dL — ABNORMAL LOW (ref >39.00)
NonHDL: 135.15
TRIGLYCERIDES: 292 mg/dL — AB (ref 0.0–149.0)
VLDL: 58.4 mg/dL — AB (ref 0.0–40.0)

## 2015-06-12 LAB — LDL CHOLESTEROL, DIRECT: LDL DIRECT: 81 mg/dL

## 2015-06-12 LAB — TSH: TSH: 1.41 u[IU]/mL (ref 0.35–4.50)

## 2015-06-17 ENCOUNTER — Other Ambulatory Visit: Payer: Self-pay | Admitting: Pulmonary Disease

## 2015-06-17 MED ORDER — LORAZEPAM 1 MG PO TABS: ORAL_TABLET | ORAL | Status: DC

## 2015-07-06 ENCOUNTER — Other Ambulatory Visit: Payer: Self-pay | Admitting: Pulmonary Disease

## 2015-07-06 MED ORDER — LISINOPRIL 10 MG PO TABS: 10.0000 mg | ORAL_TABLET | Freq: Every day | ORAL | Status: DC

## 2015-07-10 ENCOUNTER — Other Ambulatory Visit: Payer: Self-pay | Admitting: Pulmonary Disease

## 2015-07-21 ENCOUNTER — Other Ambulatory Visit: Payer: Self-pay | Admitting: Pulmonary Disease

## 2015-08-10 ENCOUNTER — Other Ambulatory Visit: Payer: Self-pay | Admitting: Pulmonary Disease

## 2015-08-24 ENCOUNTER — Other Ambulatory Visit: Payer: Self-pay | Admitting: Pulmonary Disease

## 2015-10-11 ENCOUNTER — Other Ambulatory Visit: Payer: Self-pay | Admitting: Pulmonary Disease

## 2015-11-26 DIAGNOSIS — J309 Allergic rhinitis, unspecified: Secondary | ICD-10-CM | POA: Diagnosis not present

## 2015-11-26 DIAGNOSIS — Z6828 Body mass index (BMI) 28.0-28.9, adult: Secondary | ICD-10-CM | POA: Diagnosis not present

## 2015-11-30 ENCOUNTER — Ambulatory Visit: Payer: Medicare Other | Admitting: Pulmonary Disease

## 2015-12-01 DIAGNOSIS — C61 Malignant neoplasm of prostate: Secondary | ICD-10-CM | POA: Diagnosis not present

## 2015-12-07 DIAGNOSIS — Z0000 Encounter for general adult medical examination without abnormal findings: Secondary | ICD-10-CM | POA: Diagnosis not present

## 2015-12-07 DIAGNOSIS — C61 Malignant neoplasm of prostate: Secondary | ICD-10-CM | POA: Diagnosis not present

## 2015-12-12 ENCOUNTER — Other Ambulatory Visit: Payer: Self-pay | Admitting: Pulmonary Disease

## 2015-12-29 ENCOUNTER — Other Ambulatory Visit: Payer: Self-pay | Admitting: Pulmonary Disease

## 2015-12-29 ENCOUNTER — Ambulatory Visit (INDEPENDENT_AMBULATORY_CARE_PROVIDER_SITE_OTHER): Payer: Medicare Other | Admitting: Pulmonary Disease

## 2015-12-29 ENCOUNTER — Encounter: Payer: Self-pay | Admitting: Pulmonary Disease

## 2015-12-29 ENCOUNTER — Other Ambulatory Visit (INDEPENDENT_AMBULATORY_CARE_PROVIDER_SITE_OTHER): Payer: Medicare Other

## 2015-12-29 ENCOUNTER — Ambulatory Visit (INDEPENDENT_AMBULATORY_CARE_PROVIDER_SITE_OTHER)
Admission: RE | Admit: 2015-12-29 | Discharge: 2015-12-29 | Disposition: A | Discharge status: Home / Self Care | Payer: Medicare Other | Source: Ambulatory Visit | Attending: Pulmonary Disease | Admitting: Pulmonary Disease

## 2015-12-29 VITALS — BP 134/80 | HR 55 | Ht 71.0 in | Wt 198.2 lb

## 2015-12-29 DIAGNOSIS — I10 Essential (primary) hypertension: Secondary | ICD-10-CM | POA: Diagnosis not present

## 2015-12-29 DIAGNOSIS — R7301 Impaired fasting glucose: Secondary | ICD-10-CM

## 2015-12-29 DIAGNOSIS — I2510 Atherosclerotic heart disease of native coronary artery without angina pectoris: Secondary | ICD-10-CM

## 2015-12-29 DIAGNOSIS — C61 Malignant neoplasm of prostate: Secondary | ICD-10-CM

## 2015-12-29 DIAGNOSIS — J410 Simple chronic bronchitis: Secondary | ICD-10-CM | POA: Diagnosis not present

## 2015-12-29 DIAGNOSIS — E785 Hyperlipidemia, unspecified: Secondary | ICD-10-CM | POA: Diagnosis not present

## 2015-12-29 DIAGNOSIS — F411 Generalized anxiety disorder: Secondary | ICD-10-CM

## 2015-12-29 DIAGNOSIS — M159 Polyosteoarthritis, unspecified: Secondary | ICD-10-CM

## 2015-12-29 DIAGNOSIS — Z8546 Personal history of malignant neoplasm of prostate: Secondary | ICD-10-CM | POA: Diagnosis not present

## 2015-12-29 DIAGNOSIS — M150 Primary generalized (osteo)arthritis: Secondary | ICD-10-CM

## 2015-12-29 DIAGNOSIS — M8949 Other hypertrophic osteoarthropathy, multiple sites: Secondary | ICD-10-CM

## 2015-12-29 LAB — BASIC METABOLIC PANEL
BUN: 19 mg/dL (ref 6–23)
CALCIUM: 9.5 mg/dL (ref 8.4–10.5)
CO2: 28 mEq/L (ref 19–32)
CREATININE: 0.95 mg/dL (ref 0.40–1.50)
Chloride: 103 mEq/L (ref 96–112)
GFR: 82.3 mL/min (ref >60.00)
Glucose, Bld: 132 mg/dL — ABNORMAL HIGH (ref 70–99)
Potassium: 4.6 mEq/L (ref 3.5–5.1)
Sodium: 138 mEq/L (ref 135–145)

## 2015-12-29 LAB — HEMOGLOBIN A1C: HEMOGLOBIN A1C: 7.4 % — AB (ref 4.6–6.5)

## 2015-12-29 MED ORDER — METFORMIN HCL 500 MG PO TABS: 500.0000 mg | ORAL_TABLET | Freq: Every day | ORAL | Status: DC

## 2015-12-29 MED ORDER — GABAPENTIN 100 MG PO CAPS: ORAL_CAPSULE | ORAL | Status: DC

## 2015-12-29 NOTE — Patient Instructions (Signed)
Today we updated your med list in our EPIC system...    Continue your current medications the same...   Today we checked a follow up CXR & your targeted labs...    We will contact you w/ the results when available...   We decided to try the GABAPENTIN at bedtime --     start w/ 100mg  for about 3 weeks then increase to 200mg  as directed...  Call for any questions...  Let's plan a follow up visit in 28mo, sooner if needed for problems.Marland KitchenMarland Kitchen

## 2015-12-29 NOTE — Progress Notes (Signed)
Quick Note:  Called spoke with patient's spouse. Advised of lab results / recs as stated by SN. Spouse verbalized understanding and denied any questions. Orders only encounter for Metformin Rx. ______

## 2015-12-31 NOTE — Progress Notes (Signed)
Quick Note:  ATC x1 - no answer and no option to LM. WCB. ______

## 2016-01-14 ENCOUNTER — Other Ambulatory Visit: Payer: Self-pay | Admitting: Pulmonary Disease

## 2016-03-02 DIAGNOSIS — H524 Presbyopia: Secondary | ICD-10-CM | POA: Diagnosis not present

## 2016-03-07 DIAGNOSIS — L821 Other seborrheic keratosis: Secondary | ICD-10-CM | POA: Diagnosis not present

## 2016-03-07 DIAGNOSIS — L570 Actinic keratosis: Secondary | ICD-10-CM | POA: Diagnosis not present

## 2016-03-07 DIAGNOSIS — R233 Spontaneous ecchymoses: Secondary | ICD-10-CM | POA: Diagnosis not present

## 2016-04-20 ENCOUNTER — Other Ambulatory Visit: Payer: Self-pay | Admitting: Pulmonary Disease

## 2016-05-04 ENCOUNTER — Other Ambulatory Visit: Payer: Self-pay | Admitting: Urology

## 2016-05-04 DIAGNOSIS — Z79899 Other long term (current) drug therapy: Secondary | ICD-10-CM

## 2016-05-04 DIAGNOSIS — C61 Malignant neoplasm of prostate: Secondary | ICD-10-CM | POA: Diagnosis not present

## 2016-06-02 ENCOUNTER — Ambulatory Visit
Admission: RE | Admit: 2016-06-02 | Discharge: 2016-06-02 | Disposition: A | Discharge status: Home / Self Care | Payer: Medicare Other | Source: Ambulatory Visit | Attending: Urology | Admitting: Urology

## 2016-06-02 DIAGNOSIS — Z79899 Other long term (current) drug therapy: Secondary | ICD-10-CM

## 2016-06-02 DIAGNOSIS — Z13820 Encounter for screening for osteoporosis: Secondary | ICD-10-CM | POA: Diagnosis not present

## 2016-06-28 NOTE — Progress Notes (Signed)
Subjective:     Patient ID: [name], male   DOB: [birth year], 75 y.o.   MRN: [id number]  HPI 75 y/o WM here for a follow up visit... he has multiple medical problems as noted below... Torrez was [name] in the 1960s! ~  SEE PREV EPIC NOTES FOR OLDER DATA >>   ~  February 05, 2013:  10yrROV>  VTaberreturns after a long hiatus- being cared for by DC[employer]ar [employer] Communicationsfor Cards, & DrGrapey for Urology;  He has numerous medical issues- addressed below, but returns asking for a nerve pill describing lots of stress w/ his business etc; we discussed trial Alpraz0.'5mg'$  tid as needed...     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    HBP> on Aten25, Lisin10;  BP= 142/78 & wt=199# (same as 2011); he denies CP, palpit, SOB, dizzy, edema, etc...    CAD> rec to take ASA81; followed by DrHochrein & last seen 10/13- stable, no changes made...    Dyslipidemia> on Prav20;  FLP 6/14 showed TChol 169, TG 242, HDL 31, LDL 93 & we reviewed diet, exercise, & rec adding FENOFIBRATE '160mg'$ /d...     GI- GERD, Divertics> on Omep40;  He denies abd pain, dysphagia, n/v, c/d, blood seen; last colon by DrPatterson was 2004 & he is due for f/u procedure...     Hx Prostate Cancer> followed by DrGrapey but pt missed his last appt & is due now- Hx prostate ca 2007 w/ IMRT & hormone therapy x247yr slowly rising PSA per Urology but Lab 6/14 showed PSA= 3.21    DJD> on OTC analgesics as needed; he has seen DrDaldorf in the past for left knee pain...    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 We reviewed prob list, meds, xrays and labs> see below for updates >>   LABS 6/14:  FLP- chol ok but TG=242 HDL=31;  Chems- wnl;  CBC- wnl;  TSH=1.28;  PSA=3.21 (prev in 2011 it was 0.57)...  ~  August 13, 2013:  74m7moV &  [name] has gained 9# up to 208# today, he blames it on quitting chewing tobacco but I told him the trade-off was worth it, asked to get on diet & incr exercise... He has no new complaints or concerns...    Breathing is stable but needs to incr exercise program...    BP is controlled w/ Aten25 & Lisin10;  BP=128/78 today & he denies CP, palpit, SOB, edema...    He saw DrHochrein 10/14- on above + ASA; stable & asymptomatic, no changes made, f/u 5yr80yr   Lipids treated w/ Prav20 & diet but he's gained wt as noted; intol to Fenofib previously- we reviewed diet, exercise, wt reduction strategies...    He saw DrGrapey for prostate Ca follow up 8/14> treated w/ XRT but slowly rising PSA since then, they are on Observation protocol w/ check ups every 74mo.4moe reviewed prob list, meds, xrays and labs> see below for updates >> Given PREVNAR-13 & Rx for Shingles vaccine...   EKG 10/14 by DrHochrein showed SBrady, rate52, borderline EKG, NAD...   ADDENDUM> PSA 2/15 by DrGrapey was 4.26...   ~  February 10, 2014:  75mo R52mond [name] that he is doing well, no new complaints or concerns;  He has a rising  PSA- followed by DrGrapey Q67mow/ PSADT est ~184mo last measured 4.26 in [id number] He saw Urology 3/15 & note reviewed (prev hx well outlined) and plan was to continue monitoring Q6m38motil PSA ~10 then consider hormone therapy...  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    HBP> on Aten25, Lisin10;  BP= 132/82 & wt=203# (down 5# last 17mo43moe  denies CP, palpit, SOB, dizzy, edema, etc...    CAD> on ASA81; followed by DrHochrein & last seen 10/14- remains stable, no changes made, f/u 54yr.22yr  Dyslipidemia> on Prav20; hx intol to Fenofibrate; FLP 6/14 showed TChol 169, TG 242, HDL 31, LDL 93 & we reviewed diet, exercise, & wt reduction...    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 We reviewed prob list, meds, xrays and labs> see below for updates >>   CXR 7/15 shows norm heart size & Ao atherosclerosis, bilat pulm nodules are visualized (new since 2/11 CXR) w/ largest ~2cm in LUL...    LABS 7/15:  FLP- not at goals w/ TG elev & HDL low;  Chems- ok x BS=135;  CBC- wnl;  TSH=1.38;  PSA=6.15...  CT Chest/ Abd/ Pelvis 7/15 showed numerous bilat pulm nodules; no signif findings in abd/pelvis x gallstones, adrenal adenoma, scat divertics, and atherosclerotic changes; degen changes in spine PLAN>  CXR shows recurrent lung nodules (assoc w/ his rising PSA); prev noted nodules from 2007 were assoc w/ PSA of 10.6 at time of Dx, and the CXR cleared w/o nodules seen in 2008, 2009, 2011; he has been followed by Urology w/ slowly rising PSA at 3.1 last yr and 4.26 earlier this yr, and now=6.15   With f/u CXR showing recurrent mult bilat pulm nodules (also seen at time of Prostate cancer Dx in 2007 & resolved (2008, 2009, 2011) after XRT & hormone therapy at time of dx...   PET Scan 02/20/14 showed numerous bilat  noncalcif pulm nodules, 5 measured w/ max SUV 1.6-4.6, coronary atherosclerosis, no skeletal metssmall right adrenal nodule (adenma favored)...  Nuclear Bone Scan 02/27/14 showed focal areas of uptake in spine (most likely degenerative) & knee/ shoulder- no convincing evid for mets to bone...  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  PLAN>  He has upcoming appt w/ DrGrapey for Urology   ~  June 18, 2014:  126moROV & VMasayukisaw DrGrapey 9/15 who agreed w/ our Dx of an unusual case of Prostate Cancer w/ mult lung metastasis; they started Rx w/ Lupron (planning shots Q434moand used Casodex for the 1st 30d only; he has f/u appt in Jan2016 w/ Urology for PSA & Testos levels; by his account he has been tolerating the Rx well w/o new issues until last week...  He states that on 11/23 he had a "head cold & chest congestion" so he went to the local [facility] & was evaluated & given Omnicef & a steroid shot in right buttock; on 11/24 he awoke w/ pain in his right leg (?hip area & thigh area mostly) & states he "couldn't walk" due to the pain & numbness; on 11/25 he went to his Chiro w/ XRays done (later told they showed "alot of issues in my lower back"); he has an inversion table he's used on & off for yrs but hasn't been on it recently; 11/27 was Thanksgiving & on 11/28 he was hurting so much [facility] XRays of Lumbar sp w/ extensive degen changes, XRay of right hip w/ min degen arthritis, VenDopplers neg for DVT, no rash presenty, sl tender right hip but good ROM> they told him likely siatica- given Pred taper & Percocet5 which he has taken ~3/d & today notes sl improved...  Still no rash present & exam is unchanged from what is described- min tender, good ROM, essent neg SLR, symmetric reflexes and strength; note- [facility]...  Daughter  requests that he see DrLucey for Ortho eval & we will try to expedite this ASAP for pt...     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     Prostate Cancer w/ lung mets> 9/15 OV by DrGrapey reviewed; pt was started on Lupron shots (planned Q4mo326mogiven Casodex x30d to start; pt has f/u visit sched for [id number] labs (PSA & Testos) and 2nd Lupron injection... We reviewed prob list, meds, xrays and labs> see below for updates >>   CXR 12/15 showed signif improvement w/ decr in size & number of nodules... PLAN>> now w/ new onset of right leg pain- primarily right thigh area w/ some burning as well; no rash present & asked to watch for vesicular eruption & call if any changes; this is the vicinity of meralgia paresthetica & we discussed trial GABAPENTIN start 100mg626m; continue Pred taper given in ER and refilled the Oxycodone5 Tid + constip prophylactic regime w/ Miralax daily & Senakot-S Qhs... Plan ROV 6wks...  ~  July 30, 2014:  6wk ROV & VanceArkeems me he went to an Ortho in [place] who did MRI and referred him to NS- DrJenkins, told him he could do surg but advised holding off since his pain level is diminished, still taking the Neurontin100Tid & Oxycodone5 prn; we do not have notes from these consultants & he is asked to get records sent to [place] into Epic...     VanceEyoel/o a URI w/ cough, beige sput, sinus drainage, & chest soreness from the cough; he denies f/c/s, denies incr SOB, etc; we decided to treat w/ ZPak, Hycodan, Mucinex, fluids...     He has f/u appt w/ Urology, DrGrapey soon; as noted his last CXR 12/15 showed signif improvement w/ decr  in size & number of nodules (believed to be metastatic prostate cancer)...  We reviewed prob list, meds, xrays and labs> see below for updates >>  PLAN>> we decided to treat w/ ZPak, Hycodan, Mucinex, fluids; he will f/u in 70mow/ repeat  CXR...  ~  Nov 28, 2014:  428moOV & VaDanielaeports that is breathing is good, no problems reported- denies cough, sput, SOB, CP, etc;  His CC remains LBP- pt says MRI reveqaled bulging discs & he is sched for an epid steroid shot per DrJenkins (we still do [place] He continues to f/u w/ Urology- DrGrapey & pt indicates that he's due for lab work next week & a hormone shot after that- again he is asked to request records be sent to [place] his specialists for [place] review & so these records will be avail in EPIC...  EXAM reveals Afeb, VSS, O2sat=99% on RA;  HEENT- neg, mallampati2;  Chest- clear w/o w/r/r;  Heart- RR w/o m/r/g;  Ext- neg w/o c/c/e...   CXR 5/16 shows regression of the bilat lung nodules & now resolved, norm heart size, NAD...  PLAN>>  VaArgeniss stable from the pulmonary standpoint; he has mult specialists attending his various problems & he is reminded to get records sent to [place] review & scan into Epic...   ~  June 01, 2015:  15m70moV & VanBobports that he is doing well- he remains on Lupron shots per DrGrapey- last seen 04/13/15 & note reviewed- he's had a great response to the Lupron w/ PSA falling to zero & pulmonary metastatic prostate cancer nodules melting away; tolerating the shots well...  He had a ruptured bicep tendon in right arm repaired by DrKuzma 02/2015... 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    HBP> on Aten25, Lisin10;  BP= 132/84 & wt=203# (stable); he denies CP, palpit, SOB, dizzy, edema, etc...     CAD> on ASA81; followed by DrHochrein & last seen 10/15- remains stable, no changes made, overdue for yearly f/u visit w/ Cards...    Dyslipidemia> on Prav20; hx intol to Fenofibrate; FLP 11/16 showed TChol 169, TG 292, HDL 34, LDL 81 & we reviewed better diet, exercise, & wt reduction...    GI- GERD, Divertics> on Omep40;  He denies abd pain, dysphagia, n/v, c/d, blood seen; last colon by DrPatterson was 8/14 & showed mod divertics, no polyps, and he rec f/u colon for routine risk ~10y87yr Hx Prostate Cancer w/ lung mets> followed by DrGrapey w/ hx prostate ca 2007 w/ IMRT & hormone therapy x2yrs33yr has had slowly rising PSA= 3.21 [id number]= 4.26 [id number]= 6.15 Jul2015=> Dx w/ metastatic ca to lungs; treated by DrGrapey w/ Lupron shots and PSA ret to zero w/ resolution of the pulm nodules...    DJD> on OTC analgesics as needed; he has seen DrDaldorf in the past for left knee pain...    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 EXAM  reveals Afeb, VSS, O2sat=98% on RA;  HEENT- neg, mallampati2;  Chest- clear w/o w/r/r;  Heart- RR w/o m/r/g;  Abd- soft, nontender, neg;  Ext- neg w/o c/c/e...   LABS 05/2015>  FLP- ok on Prev20 x TG=292;  Chems- ok x BS=147;  CBC- wnl;  TSH=1.41... PSAs checked by DrGrapey 7 reported to be= zero... IMP/PLAN>>  Adael is stable on current regimen;  Given 2016 Flu vaccine; rec to continue same and we plan ROV in 9mo..  ~  December 29, 2015:  658moOV & VaPrestineports doing well overall;  He tells me that he has several friends on Gabapentin for back pain & he wants to try it if at all poss- we discussed this med & agreed to Rx w/ '100mg'$  Qhs=> incr to '200mg'$  if tolerated;  See prob list above...     BP/ CAD well controlled on ASA, Aten25 & Lisin10; BP=134/80 & he denies HA, visual sx, CP, palpit, SOB, edema, etc...    He remains on Prav20 + FishOil and FLP 11/16 showed elev TG- advised same med, better low fat diet,  lose 10 lbs...    He has been trying to control his DM w/ diet alone- but labs today showed BS=132, A1c=7.4 & we decided to start METFORMIN '500mg'$  Qam...    He has hx of prostate Ca w/ lung mets- followed by DrGrapey & last seen ~6wks ago- on Lupron shots and pt reports that PSA remains zero!    VaNehemyahas DJD, LBP/ bulging discs on Percocet10 prn, using sparingly & ave 1/wk; he tells me that he takes Aleve Bid & wants to try the Gabapentin as above... EXAM reveals Afeb, VSS, O2sat=96% on RA;  HEENT- neg, mallampati2;  Chest- clear w/o w/r/r;  Heart- RR w/o m/r/g;  Abd- soft, nontender, neg;  Ext- neg w/o c/c/e...   CXR 12/29/15 showed norm heart size, clear lungs w/o nodules, Tspine degen changes, NAD...  LABS 12/29/15>  Chems- ok x BS=132;  A1c=7.4...Marland KitchenMarland KitchenMP/PLAN>>  VaDerwoods stable overall w/ his mult medical issues- we decided to start METFORM500 Qam & reviewed low carb diet, need for wt reduction;  We also started trial Gabapentin per his request for back issues;  We plan ROV 64m65moooner if needed prn...          Problem List:    BRONCHITIS, RECURRENT (ICD-491.9) - no recent URI symptoms... 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 ~  CXR 2007 when coronary stent placed showed ?sm nodules seen... ~  CT Chest 8/07 showed bilat noncalcif lung nodules, varying size in all lobes & largest= 9.4mm49m LLL, no adenopathy... note: PSA=10.6 ~  CT Abd&Pelvis 9/07 showed mult sm lung nodules at bases bilat, gallstones, sm duod divertic, scat atherosclerotic calcif, no mass or adenop, asymmetric enlarged prostate...  ~  CT Chest 12/07 showed mult pulm nodules bilat- no change, no adenopathy, coronary calcif...  ~  f/u CXR 8/08 without nodules seen... (note: PSA= 0.03) ~  f/u CXR 11/09 clear, no lesions seen... ~  f/u CXR 2/11 showed clear, NAD... (Marland Kitchenote: PSA= 0.57) ~  CXR 7/15 shows norm heart size & Ao atherosclerosis, bilat pulm  nodules are visualized (new since 2/11 CXR) w/ largest ~2cm in LUL... 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 ~  CXR  12/15 (after Lupron shot & 65moof Casodex) showed signif improvement w/ decr in size & number of nodules ~  1/16: he returned w/ URI- cough, sput, chest soreness and we decided to treat w/ ZPak, Hycodan, Mucinex, fluids... ~  CXR 5/16 shows resolution of the prev metastatic nodules, heart size is wnl, NAD..Marland Kitchen  ~  CXR 6/17 showed norm heart size, clear lungs w/o nodules, Tspine degen changes, NAD...  HYPERTENSION (ICD-401.9) - contolled on ATENOLOL '25mg'$ /d, & LISINOPRIL '10mg'$ /d...  ~  9/11:  BP=110/62 here and usually 120s/ 70s at home- denies HA, fatigue, visual changes, CP, palipit, dizziness, syncope, dyspnea, edema, etc... ~  7/14:  on Aten25, Lisin10;  BP= 142/78 & wt=199# (same as 2011); he denies CP, palpit, SOB, dizzy, edema, etc. ~  7/15: on Aten25, Lisin10;  BP= 132/82 & wt=203# (down 5# last 647mo he remains asymptomatic... ~  12/15: on Aten25, Lisin10; BP= 140/90 & wt=202#; he denies CP, palpit, SOB, edema... ~  5/16: on Aten25, Lisin10; BP= 140/90 & he is asymptomatic... Reminded to elim sodium. ~  6/17:  well controlled on ASA, Aten25 & Lisin10; BP=134/80 & he denies HA, visual sx, CP, palpit, SOB, edema, etc;...  CAD (ICD-414.00) - followed by DrHochrein... on ASA '325mg'$ /d. ~  NuclearStressTest 8/07 abnormal w/ ant ischemia... ~  cath 8/07 showed 95% mid-LAD stenosis, & 20% lesions in the other 2 vessels, EF=60%... subseq PTCA w/ non-drug eluting stent... ~  OV w/ Cards= 5/10 & note reviewed- BP sl elevated & Lisinopril added. ~  He saw DrHochrein 10/13> doing well, no new symptoms, exam was neg, no change in meds & rec aggressive risk factor reduction strategy...  ~  EKG 10/13 showed NSR, rate62, LAD, otherw wnl, NAD... ~  He saw DrHochrein 10/14- on above + ASA;  stable & asymptomatic, no changes made, f/u 1y67yr He saw DrHochrein 10/15> HBP, CAD, HL- stable w/o CP/ palpit/ SOB/ edema/ etc (despite being diagnosed w/ met prostate ca to lungs)...  DYSLIPIDEMIA (ICD-272.4) - on PRAVACHOL '20mg'$ /d and tol OK...  ~  FLPRossford08 showed TChol 168, TG 143, HDL34, LDL105 ~  FLP 2/09 showed TChol 163, TG 207, HDL 34, LDL 91 ~  FLP 5/10 showed TChol 148, TG 131, HDL 33, LDL 89 ~  FLP 9/11 showed TChol 163, TG 140, HDL 42, LDL 93 ~  FLP 6/14 on Prav20 showed TChol 169, TG 242, HDL 31, LDL 93  ~  FLP 7/15 on Prav20 showed TChol 174, TG 290, HDL 31, LDL 87... We reviewed low fat diet & need for wt reduction. ~  FLP 11/16 on Prav20 showed TChol 169, TG 292, HDL 34, LDL 81... Ditto  IFG=> Diabetes Mellitus> ~  Hx BS betw 130-150 on diet alone... ~  6/17:  Labs showed BS=132, A1c=7.4 and we decided to start METFORMIN '500mg'$  Qam...  GERD (ICD-530.81) - prev on OMEP20 but causes diarrhea he says, and ACIPHEX '20mg'$ /d works better...  ~  last EGD (DrPatterson)was 8/04 showing GERD & stricture dilated... ~  5/11: presented to GI w/ recurrent dysphagia- EGD showed 3cmHH, stricture dilated, no Barrett's, changed to Aciphex. ~  On Omep40 & he remains asymptomatic w/o dysphagia, CP, abd pain, n/v, c/d, blood seen...  DIVERTICULOSIS OF COLON (ICD-562.10) >>  ~  Colonoscopy 8/04 by DrPatterson was normal, f/u 10y56yrcattered tics seen on 1996 flex) ~  7/14:  He is due for f/u colon 7 we will refer the chart to DrPatterson for review... ~  Colonoscopy  8/14 by DrPatterson showed mod divertics, no polyps, and he rec f/u colon for routine risk ~64yr...  PROSTATE CANCER (ICD-185) - followed by DrPeterson- elevated PSA found 8/07 (10.6)... biopsies were pos (Stage T2b, bilat dis w/ Gleason4+3=7 on one side & 8 on the other) & there is a family hx as well; he had bilat pulm nodules on CXR & CT Chest at diagnosis... [facility], then [facility] Rx [facility] 2  [facility]... [facility]... prev on TRELSTAR injections q 352month& PROVERA for the hot flashes>> x2y50yr neg bone scan 1/08;  PSA here= 0.03 in 2008, and CXR cleared w/o nodules seen in 2008, 2009, 2011 (see above)... ~  2/11: we don't have recent note from [place]pt will request info sent to [place] next visit. ~  9/11: labs here showed PSA= 0.57 & we will forward to DrPEmerson 9/13:  He had f/u visit w/ DrGrapey> PSA had been slowly rising and ~1 when they last checked; DrGrapey rec Q6mo38mo  ~  7/14:  Pt missed his 3/14 appt w/ Urology & is sched to see them 8/14; PSA recorded 6/14 = 3.21 & we sent copy to DrGrapey... ~  3/15:  He had f/u DrGrapey> prostate Ca dx 2007, [facility] (x2yrs74yrIMRT in 2008; slowly rising PSA w/ doubling time ~58mo 35moDrGrapey follows pt Q6mo & 16mo is for hormone therapy when PSA>10... ~  7/15:  Routine CXR showed return of bilat pulm nodules assoc w/ PSA= 6.15  (CT Chest, Abd, Pelvis is planned & discussion w/ DrGrapey & Oncology)=> Dx w/ metastatic prostate adenocarcinoma (lung mets) ~  9/15: he was started on Lupron shots Q4mo, gi80moCasodex daily x30d per DrGrapey; f/u OV planned Jan2016.[id number]/15: CXR showed improvement w/ decr in size & number of lung nodules... He has a follow up appt w/ DrGrapey in Jan2016.  DEGENERATIVE JOINT DISEASE (ICD-715.90) >>  RIGHT THIGH AREA PAIN 12/15 >> ?etiology, [facility] & [facility]... ~  his left knee gives him some pain on and off; he saw DrDaldorf for this 12/08 & he thought there might be a torn meniscus, they decided to Rx w/ NAPROSEN which helps; may yet need MRI/ arthroscopy... ~  2/11: discussed trial of Mobic to see if this works for him. 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 They want to see DrLucey for Ortho eval, we will give trial GABAPENTIN100Tid, watch for rash (he had Shingles vaccine 2 mo ago)... ~  1/16: Kyser teJonathyne he went to an Ortho in [place] who did MRI and referred him to NS- DrJenkins, told him he could do surg but advised holding off since his pain level is diminished, still taking the Neurontin100Tid & Oxycodone5 prn; we do not have notes from these consultants & he is asked to get records sent to us to [place] into Epic. ~  6/17:  Jovoni reDurene Fruits that he has several frinds on Gabapentin w/ relief of back pain & he requests a trial- OK start w/ '100mg'$  Qhs & incr to '200mg'$  if tol...  HEALTH MAINTENANCE >>  ~  GI>  [facility]  last colon was 2004- f/u due now... ~  GU>  Followed by DrGrapey & seen every 31mofor PSA recheck but he missed the 3/14 appt & our PSA 6/14= 3.21 (copy to Urology & he has appt 8/14)... ~  Immuniz>  He gets the yearly flu vax (last 10/13);  Had Pneumovax several yrs ago;  He received the PREVNAR-13 vax 1/15; Not sure of last Tetanus vaccine; Asking about the shingles shot=> Rx written [facility]...   Past Surgical History:  Procedure Laterality Date  . DISTAL BICEPS TENDON REPAIR Right 03/12/2015   Procedure: REPAIR/RECONSTRUCTION RIGHT BICEPS TENDON;  Surgeon: GDaryll Brod MD;  [facility]  Service: Orthopedics;  Laterality: Right;  . eyelid surgery for Lid Lag    . FOOT SURGERY    . HYDROCELE EXCISION / REPAIR    . INGUINAL HERNIA REPAIR     75yrs old  . lung nodules    . MASTECTOMY     benign tumor  . Prostate treatment     cancer  . SKIN CANCER EXCISION  2012   skin cancer on right ear    Outpatient Encounter Prescriptions as of 12/29/15  Medication Sig  . ALPRAZolam (XANAX) 0.5 MG tablet TAKE ONE-HALF TO 1 TABLET BY MOUTH THREE TIMES DAILY AS  NEEDED ANXIETY  . aspirin 81 MG tablet Take 81 mg by mouth daily.  .Marland Kitchenatenolol (TENORMIN) 25 MG tablet TAKE 1 TABLET BY MOUTH EVERY DAY  . b complex vitamins tablet Take 1 tablet by mouth daily.  . Calcium-Magnesium-Vitamin D (CALCIUM 1200+D3 PO) Take 1 tablet by mouth daily.  . Cyanocobalamin (VITAMIN B-12 PO) Take 1 tablet by mouth daily.  .Marland KitchenGLUCOSAMINE SULFATE PO Take 1 tablet by mouth 2 (two) times daily.  .Marland Kitchenlisinopril (PRINIVIL,ZESTRIL) 10 MG tablet TAKE 1 TABLET BY MOUTH EVERY DAY  . Multiple Vitamins-Minerals (MULTIVITAMIN PO) Take 1 tablet by mouth daily.  . Omega-3 Fatty Acids (FISH OIL) 1000 MG CAPS Take 1,000 mg by mouth daily.  .Marland Kitchenomeprazole (PRILOSEC) 40 MG capsule TAKE ONE CAPSULE BY MOUTH EVERY DAY  . oxyCODONE-acetaminophen (PERCOCET) 10-325 MG tablet Take 1 tablet by mouth every 4 (four) hours as needed for pain.  . pravastatin (PRAVACHOL) 20 MG tablet TAKE 1 TABLET BY MOUTH EVERY DAY  . RABEprazole (ACIPHEX) 20 MG tablet Take 20 mg by mouth daily.    Allergies  Allergen Reactions  . Simvastatin Other (See Comments)    REACTION: pt states INTOL to ZOCOR w/ leg pains    Current Medications, Allergies, Past Medical History, Past Surgical History, Family History, and Social History were reviewed in [employer].   Review of Systems         See HPI - The patient denies anorexia, fever, weight loss, weight gain, vision loss, decreased hearing, hoarseness, chest pain, syncope, dyspnea on exertion, peripheral edema, prolonged cough, headaches, hemoptysis, abdominal pain, melena, hematochezia, severe indigestion/heartburn, hematuria, incontinence, muscle weakness, suspicious skin lesions, transient blindness, difficulty walking, depression, unusual weight change, abnormal bleeding, enlarged lymph nodes, and angioedema.     Objective:   Physical Exam    WD, WN, 75y/o WM in NAD... GENERAL:  Alert & oriented; pleasant & cooperative... HEENT:   /AT, EOM-wnl, PERRLA, EACs-clear, TMs-wnl, NOSE-clear, THROAT-clear & wnl. NECK:  Supple w/ fairROM; no JVD; normal carotid impulses w/o bruits; no thyromegaly or nodules palpated; no lymphadenopathy. CHEST:  Clear to P & A; without wheezes/ rales/ or  rhonchi heard... HEART:  Regular Rhythm; without murmurs/ rubs/ or gallops detected... ABDOMEN:  Soft & nontender; normal bowel sounds; no organomegaly or masses palpated... EXT: without deformities, mild arthritic changes; no varicose veins/ venous insuffic/ or edema.     c/o pain in right thigh area- good ROM hip, neg SLR, reflexes symmetric, no rash apparent... NEURO:  CN's intact; motor testing normal; sensory testing normal; gait normal & balance OK. DERM:  No lesions noted; no rash etc...  RADIOLOGY DATA:  Reviewed in the EPIC EMR & discussed w/ the patient...  LABORATORY DATA:  Reviewed in the EPIC EMR & discussed w/ the patient...   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 9/15> DrGrapey started treatment w/ Lupron [id number] x30d at outset... 12/15> CXR already shows signs of improvement w/ decr size & number of lesions... 5/16> CXR nodules have resolved w/ ongoing treatment for his prostate cancer... 6/17>  He remains on Lupron from DrGrapey w/ PSA reported at zero & CXR remains clear  HBP>  Stable on low dose Aten & Lisin, continue same...  CAD>  Followed by DrHochrein; seen last 10/14 & he remains stable, no changes made; needs to incr his exercise program...  Dyslipidemia>  On Prav20 but TG &  HDL deranged; needs better diet, exercise, & wt reduction...  GI- GERD, Diverics, needs f/u colon>  F/u colon 8/14 was neg x divertics, no polyps, f/u rec for 1079yr.  Prostate Ca>  Managed by DrGrapey as noted; rising PSA indicates recurrent dis and mult lung nodules represent metastatic dis...  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 Right leg pain ?etiology> we treated empirically w/ Neurontin & prn oxycod; he went to see Ortho in [place], MRI done, referred to NS- DrJJenkins & we will call for his notes...  Anxiety>  He is requesting Rx- try ALPRAZOLAM 0.'5mg'$  tid as needed...      Plan:     Patient's Medications  New Prescriptions   GABAPENTIN (NEURONTIN) 100 MG CAPSULE    TAKE 1 CAPSULE BY MOUTH EVERY NIGHT AT BEDTIME FOR 3 WEEKS, THEN 2 CAPSULES BY MOUTH EVERY NIGHT AT BEDTIME THEREAFTER   METFORMIN (GLUCOPHAGE) 500 MG TABLET    Take 1 tablet (500 mg total) by mouth daily with breakfast.  Previous Medications   ALPRAZOLAM (XANAX) 0.5 MG TABLET    TAKE ONE-HALF TO 1 TABLET BY MOUTH THREE TIMES DAILY AS NEEDED ANXIETY   ASPIRIN 81 MG TABLET    Take 81 mg by mouth daily.   ATENOLOL (TENORMIN) 25 MG TABLET    TAKE 1 TABLET BY MOUTH EVERY DAY   B COMPLEX VITAMINS TABLET    Take 1 tablet by mouth daily.   CALCIUM-MAGNESIUM-VITAMIN D (CALCIUM 1200+D3 PO)    Take 1 tablet by mouth daily.   CYANOCOBALAMIN (VITAMIN B-12 PO)    Take 1 tablet by mouth daily.   GLUCOSAMINE SULFATE PO    Take 1 tablet by mouth 2 (two) times daily.   LISINOPRIL (PRINIVIL,ZESTRIL) 10 MG TABLET  Take 1 tablet (10 mg total) by mouth daily.   LORAZEPAM (ATIVAN) 1 MG TABLET    Take 1/2-1 tablet PO TID PRN anxiety.   MULTIPLE VITAMINS-MINERALS (MULTIVITAMIN PO)    Take 1 tablet by mouth daily.   NAPROXEN SODIUM (ANAPROX) 220 MG TABLET    Take 220 mg by mouth 2 (two) times daily with a meal.   OMEGA-3 FATTY ACIDS (FISH OIL) 1000 MG CAPS    Take 1,000 mg by mouth daily.   OMEPRAZOLE (PRILOSEC)  40 MG CAPSULE    TAKE ONE CAPSULE BY MOUTH EVERY DAY   OXYCODONE-ACETAMINOPHEN (PERCOCET) 10-325 MG TABLET    Take 1 tablet by mouth every 4 (four) hours as needed for pain.   PRAVASTATIN (PRAVACHOL) 20 MG TABLET    TAKE 1 TABLET BY MOUTH EVERY DAY  Modified Medications   Modified Medication Previous Medication   ATENOLOL (TENORMIN) 25 MG TABLET atenolol (TENORMIN) 25 MG tablet      TAKE 1 TABLET BY MOUTH EVERY DAY    Take 1 tablet (25 mg total) by mouth daily.   ATENOLOL (TENORMIN) 25 MG TABLET atenolol (TENORMIN) 25 MG tablet      TAKE 1 TABLET BY MOUTH EVERY DAY    Take 1 tablet (25 mg total) by mouth daily.  Discontinued Medications   ATENOLOL (TENORMIN) 25 MG TABLET    TAKE 1 TABLET BY MOUTH EVERY DAY   ATENOLOL (TENORMIN) 25 MG TABLET    TAKE 1 TABLET BY MOUTH EVERY DAY   LISINOPRIL (PRINIVIL,ZESTRIL) 10 MG TABLET    TAKE 1 TABLET BY MOUTH EVERY DAY   RABEPRAZOLE (ACIPHEX) 20 MG TABLET    Take 20 mg by mouth daily. Reported on 12/29/2015

## 2016-06-29 ENCOUNTER — Encounter: Payer: Self-pay | Admitting: Pulmonary Disease

## 2016-06-29 ENCOUNTER — Other Ambulatory Visit (INDEPENDENT_AMBULATORY_CARE_PROVIDER_SITE_OTHER): Payer: Medicare Other

## 2016-06-29 ENCOUNTER — Ambulatory Visit (INDEPENDENT_AMBULATORY_CARE_PROVIDER_SITE_OTHER): Payer: Medicare Other | Admitting: Pulmonary Disease

## 2016-06-29 VITALS — BP 130/84 | HR 54 | Temp 97.5°F | Ht 71.0 in | Wt 194.2 lb

## 2016-06-29 DIAGNOSIS — R7989 Other specified abnormal findings of blood chemistry: Secondary | ICD-10-CM | POA: Diagnosis not present

## 2016-06-29 DIAGNOSIS — I10 Essential (primary) hypertension: Secondary | ICD-10-CM

## 2016-06-29 DIAGNOSIS — E039 Hypothyroidism, unspecified: Secondary | ICD-10-CM

## 2016-06-29 DIAGNOSIS — I2510 Atherosclerotic heart disease of native coronary artery without angina pectoris: Secondary | ICD-10-CM

## 2016-06-29 DIAGNOSIS — M8949 Other hypertrophic osteoarthropathy, multiple sites: Secondary | ICD-10-CM

## 2016-06-29 DIAGNOSIS — J410 Simple chronic bronchitis: Secondary | ICD-10-CM

## 2016-06-29 DIAGNOSIS — R7301 Impaired fasting glucose: Secondary | ICD-10-CM

## 2016-06-29 DIAGNOSIS — M545 Low back pain, unspecified: Secondary | ICD-10-CM | POA: Insufficient documentation

## 2016-06-29 DIAGNOSIS — Z23 Encounter for immunization: Secondary | ICD-10-CM

## 2016-06-29 DIAGNOSIS — M159 Polyosteoarthritis, unspecified: Secondary | ICD-10-CM

## 2016-06-29 DIAGNOSIS — F411 Generalized anxiety disorder: Secondary | ICD-10-CM

## 2016-06-29 DIAGNOSIS — C61 Malignant neoplasm of prostate: Secondary | ICD-10-CM

## 2016-06-29 DIAGNOSIS — E782 Mixed hyperlipidemia: Secondary | ICD-10-CM | POA: Diagnosis not present

## 2016-06-29 DIAGNOSIS — G8929 Other chronic pain: Secondary | ICD-10-CM

## 2016-06-29 DIAGNOSIS — M150 Primary generalized (osteo)arthritis: Secondary | ICD-10-CM

## 2016-06-29 LAB — CBC WITH DIFFERENTIAL/PLATELET
BASOS ABS: 0.1 10*3/uL (ref 0.0–0.1)
BASOS PCT: 0.8 % (ref 0.0–3.0)
EOS ABS: 0.3 10*3/uL (ref 0.0–0.7)
Eosinophils Relative: 3.4 % (ref 0.0–5.0)
HEMATOCRIT: 41.8 % (ref 39.0–52.0)
HEMOGLOBIN: 14.3 g/dL (ref 13.0–17.0)
LYMPHS PCT: 38 % (ref 12.0–46.0)
Lymphs Abs: 2.8 10*3/uL (ref 0.7–4.0)
MCHC: 34.3 g/dL (ref 30.0–36.0)
MCV: 91.9 fl (ref 78.0–100.0)
MONOS PCT: 10 % (ref 3.0–12.0)
Monocytes Absolute: 0.7 10*3/uL (ref 0.1–1.0)
NEUTROS ABS: 3.5 10*3/uL (ref 1.4–7.7)
Neutrophils Relative %: 47.8 % (ref 43.0–77.0)
Platelets: 179 10*3/uL (ref 150.0–400.0)
RBC: 4.55 Mil/uL (ref 4.22–5.81)
RDW: 13.1 % (ref 11.5–15.5)
WBC: 7.4 10*3/uL (ref 4.0–10.5)

## 2016-06-29 LAB — COMPREHENSIVE METABOLIC PANEL
ALBUMIN: 4.4 g/dL (ref 3.5–5.2)
ALT: 22 U/L (ref 0–53)
AST: 14 U/L (ref 0–37)
Alkaline Phosphatase: 78 U/L (ref 39–117)
BILIRUBIN TOTAL: 0.7 mg/dL (ref 0.2–1.2)
BUN: 16 mg/dL (ref 6–23)
CALCIUM: 9.5 mg/dL (ref 8.4–10.5)
CHLORIDE: 105 meq/L (ref 96–112)
CO2: 31 mEq/L (ref 19–32)
CREATININE: 0.82 mg/dL (ref 0.40–1.50)
GFR: 97.39 mL/min (ref >60.00)
Glucose, Bld: 138 mg/dL — ABNORMAL HIGH (ref 70–99)
Potassium: 4.7 mEq/L (ref 3.5–5.1)
SODIUM: 142 meq/L (ref 135–145)
TOTAL PROTEIN: 6.9 g/dL (ref 6.0–8.3)

## 2016-06-29 LAB — LIPID PANEL
CHOL/HDL RATIO: 5
CHOLESTEROL: 186 mg/dL (ref 0–200)
HDL: 39.8 mg/dL (ref >39.00)
NonHDL: 145.83
TRIGLYCERIDES: 251 mg/dL — AB (ref 0.0–149.0)
VLDL: 50.2 mg/dL — ABNORMAL HIGH (ref 0.0–40.0)

## 2016-06-29 LAB — TSH: TSH: 1.41 u[IU]/mL (ref 0.35–4.50)

## 2016-06-29 LAB — HEMOGLOBIN A1C: Hgb A1c MFr Bld: 6.9 % — ABNORMAL HIGH (ref 4.6–6.5)

## 2016-06-29 LAB — LDL CHOLESTEROL, DIRECT: LDL DIRECT: 96 mg/dL

## 2016-06-29 MED ORDER — LORAZEPAM 1 MG PO TABS: ORAL_TABLET | ORAL | 5 refills | Status: DC

## 2016-06-29 NOTE — Patient Instructions (Signed)
Today we updated your med list in our EPIC system...    Continue your current medications the same...  Today we checked your follow up FASTING blood work...    We will contact you w/ the results when available and assess the need for different med...  Continue your LOW CARB, LOW FAT diet & work on weight reduction...  We gave you the 2017 flu vaccine today...  We aill arrange for a follow up visit w/ Neurosurgeon Dr. Earle Gell to check your back issues...  Call for any questions...  Let's plan a follow up visit in 70mo, sooner if needed for problems.Marland KitchenMarland Kitchen

## 2016-07-01 DIAGNOSIS — J029 Acute pharyngitis, unspecified: Secondary | ICD-10-CM | POA: Diagnosis not present

## 2016-07-03 ENCOUNTER — Other Ambulatory Visit: Payer: Self-pay | Admitting: Pulmonary Disease

## 2016-07-05 ENCOUNTER — Other Ambulatory Visit: Payer: Self-pay | Admitting: Pulmonary Disease

## 2016-07-05 DIAGNOSIS — M545 Low back pain: Principal | ICD-10-CM

## 2016-07-05 DIAGNOSIS — G8929 Other chronic pain: Secondary | ICD-10-CM

## 2016-07-06 ENCOUNTER — Encounter: Payer: Self-pay | Admitting: Pulmonary Disease

## 2016-07-06 NOTE — Progress Notes (Addendum)
Subjective:     Patient ID: [name], male   DOB: [birth year], 75 y.o.   MRN: [id number]  HPI 75 y/o WM here for a follow up visit... he has multiple medical problems as noted below... [name] was [name] & [name] in the 1960s! ~  SEE PREV EPIC NOTES FOR OLDER DATA >>   ~  February 05, 2013:  39yrROV>  VHerseyreturns after a long hiatus- being cared for by DC[employer]ar Channel Communicationsfor Cards, & DrGrapey for Urology;  He has numerous medical issues- addressed below, but returns asking for a nerve pill describing lots of stress w/ his business etc; we discussed trial Alpraz0.'5mg'$  tid as needed...     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    HBP> on Aten25, Lisin10;  BP= 142/78 & wt=199# (same as 2011); he denies CP, palpit, SOB, dizzy, edema, etc...    CAD> rec to take ASA81; followed by DrHochrein & last seen 10/13- stable, no changes made...    Dyslipidemia> on Prav20;  FLP 6/14 showed TChol 169, TG 242, HDL 31, LDL 93 & we reviewed diet, exercise, & rec adding FENOFIBRATE '160mg'$ /d...     GI- GERD, Divertics> on Omep40;  He denies abd pain, dysphagia, n/v, c/d, blood seen; last colon by DrPatterson was 2004 & he is due for f/u procedure...     Hx Prostate Cancer> followed by DrGrapey but pt missed his last appt & is due now- Hx prostate ca 2007 w/ IMRT & hormone therapy x262yr slowly rising PSA per Urology but Lab 6/14 showed PSA= 3.21    DJD> on OTC analgesics as needed; he has seen DrDaldorf in the past for left knee pain...    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 We reviewed prob list, meds, xrays and labs> see below for updates >>   LABS 6/14:  FLP- chol ok but TG=242 HDL=31;  Chems- wnl;  CBC- wnl;  TSH=1.28;  PSA=3.21 (prev in 2011 it was 0.57)...  ~  August 13, 2013:  71m75moV &  [name] has gained 9# up to 208# today, he blames it on quitting chewing tobacco but I told him the trade-off was worth it, asked to get on diet & incr exercise... He has no new complaints or concerns...    Breathing is stable but needs to incr exercise program...    BP is controlled w/ Aten25 & Lisin10;  BP=128/78 today & he denies CP, palpit, SOB, edema...    He saw DrHochrein 10/14- on above + ASA; stable & asymptomatic, no changes made, f/u 30yr36yr   Lipids treated w/ Prav20 & diet but he's gained wt as noted; intol to Fenofib previously- we reviewed diet, exercise, wt reduction strategies...    He saw DrGrapey for prostate Ca follow up 8/14> treated w/ XRT but slowly rising PSA since then, they are on Observation protocol w/ check ups every 71mo.67moe reviewed prob list, meds, xrays and labs> see below for updates >> Given PREVNAR-13 & Rx for Shingles vaccine...   EKG 10/14 by DrHochrein showed SBrady, rate52, borderline EKG, NAD...   ADDENDUM> PSA 2/15 by DrGrapey was 4.26...   ~  February 10, 2014:  71mo R22mond Kalyb Ramariates that he is doing well, no new complaints or concerns;  He has a rising  PSA- followed by DrGrapey Q67mow/ PSADT est ~184mo last measured 4.26 in [id number] He saw Urology 3/15 & note reviewed (prev hx well outlined) and plan was to continue monitoring Q6m38motil PSA ~10 then consider hormone therapy...  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    HBP> on Aten25, Lisin10;  BP= 132/82 & wt=203# (down 5# last 17mo43moe  denies CP, palpit, SOB, dizzy, edema, etc...    CAD> on ASA81; followed by DrHochrein & last seen 10/14- remains stable, no changes made, f/u 54yr.22yr  Dyslipidemia> on Prav20; hx intol to Fenofibrate; FLP 6/14 showed TChol 169, TG 242, HDL 31, LDL 93 & we reviewed diet, exercise, & wt reduction...    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 We reviewed prob list, meds, xrays and labs> see below for updates >>   CXR 7/15 shows norm heart size & Ao atherosclerosis, bilat pulm nodules are visualized (new since 2/11 CXR) w/ largest ~2cm in LUL...    LABS 7/15:  FLP- not at goals w/ TG elev & HDL low;  Chems- ok x BS=135;  CBC- wnl;  TSH=1.38;  PSA=6.15...  CT Chest/ Abd/ Pelvis 7/15 showed numerous bilat pulm nodules; no signif findings in abd/pelvis x gallstones, adrenal adenoma, scat divertics, and atherosclerotic changes; degen changes in spine PLAN>  CXR shows recurrent lung nodules (assoc w/ his rising PSA); prev noted nodules from 2007 were assoc w/ PSA of 10.6 at time of Dx, and the CXR cleared w/o nodules seen in 2008, 2009, 2011; he has been followed by Urology w/ slowly rising PSA at 3.1 last yr and 4.26 earlier this yr, and now=6.15   With f/u CXR showing recurrent mult bilat pulm nodules (also seen at time of Prostate cancer Dx in 2007 & resolved (2008, 2009, 2011) after XRT & hormone therapy at time of dx...   PET Scan 02/20/14 showed numerous bilat  noncalcif pulm nodules, 5 measured w/ max SUV 1.6-4.6, coronary atherosclerosis, no skeletal metssmall right adrenal nodule (adenma favored)...  Nuclear Bone Scan 02/27/14 showed focal areas of uptake in spine (most likely degenerative) & knee/ shoulder- no convincing evid for mets to bone...  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  PLAN>  He has upcoming appt w/ DrGrapey for Urology   ~  June 18, 2014:  126moROV & VMasayukisaw DrGrapey 9/15 who agreed w/ our Dx of an unusual case of Prostate Cancer w/ mult lung metastasis; they started Rx w/ Lupron (planning shots Q434moand used Casodex for the 1st 30d only; he has f/u appt in Jan2016 w/ Urology for PSA & Testos levels; by his account he has been tolerating the Rx well w/o new issues until last week...  He states that on 11/23 he had a "head cold & chest congestion" so he went to the local [facility] & was evaluated & given Omnicef & a steroid shot in right buttock; on 11/24 he awoke w/ pain in his right leg (?hip area & thigh area mostly) & states he "couldn't walk" due to the pain & numbness; on 11/25 he went to his Chiro w/ XRays done (later told they showed "alot of issues in my lower back"); he has an inversion table he's used on & off for yrs but hasn't been on it recently; 11/27 was Thanksgiving & on 11/28 he was hurting so much [facility] XRays of Lumbar sp w/ extensive degen changes, XRay of right hip w/ min degen arthritis, VenDopplers neg for DVT, no rash presenty, sl tender right hip but good ROM> they told him likely siatica- given Pred taper & Percocet5 which he has taken ~3/d & today notes sl improved...  Still no rash present & exam is unchanged from what is described- min tender, good ROM, essent neg SLR, symmetric reflexes and strength; note- [facility]...  Daughter  requests that he see DrLucey for Ortho eval & we will try to expedite this ASAP for pt...     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     Prostate Cancer w/ lung mets> 9/15 OV by DrGrapey reviewed; pt was started on Lupron shots (planned Q4mo326mogiven Casodex x30d to start; pt has f/u visit sched for [id number] labs (PSA & Testos) and 2nd Lupron injection... We reviewed prob list, meds, xrays and labs> see below for updates >>   CXR 12/15 showed signif improvement w/ decr in size & number of nodules... PLAN>> now w/ new onset of right leg pain- primarily right thigh area w/ some burning as well; no rash present & asked to watch for vesicular eruption & call if any changes; this is the vicinity of meralgia paresthetica & we discussed trial GABAPENTIN start 100mg626m; continue Pred taper given in ER and refilled the Oxycodone5 Tid + constip prophylactic regime w/ Miralax daily & Senakot-S Qhs... Plan ROV 6wks...  ~  July 30, 2014:  6wk ROV & VanceArkeems me he went to an Ortho in [place] who did MRI and referred him to NS- DrJenkins, told him he could do surg but advised holding off since his pain level is diminished, still taking the Neurontin100Tid & Oxycodone5 prn; we do not have notes from these consultants & he is asked to get records sent to [place] to[place] into Epic...     VanceEyoel/o a URI w/ cough, beige sput, sinus drainage, & chest soreness from the cough; he denies f/c/s, denies incr SOB, etc; we decided to treat w/ ZPak, Hycodan, Mucinex, fluids...     He has f/u appt w/ Urology, DrGrapey soon; as noted his last CXR 12/15 showed signif improvement w/ decr  in size & number of nodules (believed to be metastatic prostate cancer)...  We reviewed prob list, meds, xrays and labs> see below for updates >>  PLAN>> we decided to treat w/ ZPak, Hycodan, Mucinex, fluids; he will f/u in 70mow/ repeat  CXR...  ~  Nov 28, 2014:  428moOV & VaDanielaeports that is breathing is good, no problems reported- denies cough, sput, SOB, CP, etc;  His CC remains LBP- pt says MRI reveqaled bulging discs & he is sched for an epid steroid shot per DrJenkins (we still do [place] He continues to f/u w/ Urology- DrGrapey & pt indicates that he's due for lab work next week & a hormone shot after that- again he is asked to request records be sent to us[place] his specialists for us[place] review & so these records will be avail in EPIC...  EXAM reveals Afeb, VSS, O2sat=99% on RA;  HEENT- neg, mallampati2;  Chest- clear w/o w/r/r;  Heart- RR w/o m/r/g;  Ext- neg w/o c/c/e...   CXR 5/16 shows regression of the bilat lung nodules & now resolved, norm heart size, NAD...  PLAN>>  VaArgeniss stable from the pulmonary standpoint; he has mult specialists attending his various problems & he is reminded to get records sent to [place] review & scan into Epic...   ~  June 01, 2015:  15m70moV & VanBobports that he is doing well- he remains on Lupron shots per DrGrapey- last seen 04/13/15 & note reviewed- he's had a great response to the Lupron w/ PSA falling to zero & pulmonary metastatic prostate cancer nodules melting away; tolerating the shots well...  He had a ruptured bicep tendon in right arm repaired by DrKuzma 02/2015... 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    HBP> on Aten25, Lisin10;  BP= 132/84 & wt=203# (stable); he denies CP, palpit, SOB, dizzy, edema, etc...     CAD> on ASA81; followed by DrHochrein & last seen 10/15- remains stable, no changes made, overdue for yearly f/u visit w/ Cards...    Dyslipidemia> on Prav20; hx intol to Fenofibrate; FLP 11/16 showed TChol 169, TG 292, HDL 34, LDL 81 & we reviewed better diet, exercise, & wt reduction...    GI- GERD, Divertics> on Omep40;  He denies abd pain, dysphagia, n/v, c/d, blood seen; last colon by DrPatterson was 8/14 & showed mod divertics, no polyps, and he rec f/u colon for routine risk ~10y87yr Hx Prostate Cancer w/ lung mets> followed by DrGrapey w/ hx prostate ca 2007 w/ IMRT & hormone therapy x2yrs33yr has had slowly rising PSA= 3.21 [id number]= 4.26 [id number]= 6.15 Jul2015=> Dx w/ metastatic ca to lungs; treated by DrGrapey w/ Lupron shots and PSA ret to zero w/ resolution of the pulm nodules...    DJD> on OTC analgesics as needed; he has seen DrDaldorf in the past for left knee pain...    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 EXAM  reveals Afeb, VSS, O2sat=98% on RA;  HEENT- neg, mallampati2;  Chest- clear w/o w/r/r;  Heart- RR w/o m/r/g;  Abd- soft, nontender, neg;  Ext- neg w/o c/c/e...   LABS 05/2015>  FLP- ok on Prev20 x TG=292;  Chems- ok x BS=147;  CBC- wnl;  TSH=1.41... PSAs checked by DrGrapey 7 reported to be= zero... IMP/PLAN>>  Abubakar is stable on current regimen;  Given 2016 Flu vaccine; rec to continue same and we plan ROV in 77mo..  ~  December 29, 2015:  665moOV & VaAverilleports doing well overall;  He tells me that he has several friends on Gabapentin for back pain & he wants to try it if at all poss- we discussed this med & agreed to Rx w/ 10033mhs=> incr to 200m64m tolerated;  See prob list above...     BP/ CAD well controlled on ASA, Aten25 & Lisin10; BP=134/80 & he denies HA, visual sx, CP, palpit, SOB, edema, etc...    He remains on Prav20 + FishOil and FLP 11/16 showed elev TG- advised same med, better low fat diet,  lose 10 lbs...    He has been trying to control his DM w/ diet alone- but labs today showed BS=132, A1c=7.4 & we decided to start METFORMIN 500mg57m...    He has hx of prostate Ca w/ lung mets- followed by DrGrapey & last seen ~6wks ago- on Lupron shots and pt reports that PSA remains zero!    VanceToussaintDJD, LBP/ bulging discs on Percocet10 prn, using sparingly & ave 1/wk; he tells me that he takes Aleve Bid & wants to try the Gabapentin as above... EXAM reveals Afeb, VSS, O2sat=96% on RA;  HEENT- neg, mallampati2;  Chest- clear w/o w/r/r;  Heart- RR w/o m/r/g;  Abd- soft, nontender, neg;  Ext- neg w/o c/c/e...   CXR 12/29/15 showed norm heart size, clear lungs w/o nodules, Tspine degen changes, NAD...  LABS 12/29/15>  Chems- ok x BS=132;  A1c=7.4... IMMarland KitchenMarland KitchenPLAN>>  VanceSailortable overall w/ his mult medical issues- we decided to start METFORM500 Qam & reviewed low carb diet, need for wt reduction;  We also started trial Gabapentin per his request for back issues;  We plan ROV 79mo, 9moer if needed prn...  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 Jeff JenEarle Gellhiro, told bulging discs & old XRays shHoover Brunetteextensive degen changes in lumbar spine; he had prev MRI in ? & DrJenkins told him he could do surg but advised holding off since his pain at that time was diminished; Pt recently requested trial of Gabapentin (just like several friends) & now requests f/u appt w/ neurosurg... 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    HBP> on Aten25, Lisin10;  BP= 130/84 & wt=194# (BMI=27); he denies CP, palpit, SOB, dizzy, edema, etc...    CAD> on ASA81; followed by DrHochrein & last seen 10/15- remains stable, no changes made, & overdue for f/u visit w/ Cards...    Dyslipidemia (mixed)> on Prav20; hx intol to Fenofibrate; FLP 12/17 shows TChol 186, TG 251, HDL 40, LDL 96 & we reviewed better low carb/ low fat diet, exercise, & wt reduction...    GI- GERD, Divertics> on Omep40;  He denies abd pain, dysphagia, n/v, c/d, blood seen; last colon by DrPatterson was 8/14 & showed mod divertics, no polyps, and he rec f/u colon for routine risk ~52yr    Hx Prostate Cancer w/ lung mets> followed by DrGrapey w/ hx prostate ca 2007 w/ IMRT & hormone therapy x233yr he has had slowly rising PSA= 3.21 [id number]= 4.26 [id number]= 6.15 Jul2015=> Dx w/ metastatic ca to lungs; treated by DrGrapey w/ Lupron shots and PSA ret to zero w/ resolution of the pulm nodules...    DJD> on OTC analgesics as needed; he has seen DrDaldorf in the past for left knee pain, DrJJenkins and Chiro for LBP w/ prev MRI in [place] (?results); he presented 12/17 w/ worsening/ persistent LBP & requesting NS referral => DrJenkins booked for 44m75morequested us [place] check Lumbar MRI w/ copy to him; Rx w/ rest/ heat/ Gabapentin/ Pred dosepak...    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 EXAM reveals Afeb, VSS, O2sat=96% on RA;  HEENT- neg, mallampati2;  Chest- clear w/o w/r/r;  Heart- RR w/o m/r/g;  Abd- soft, nontender, neg;  Back- sl tender, decr  ROM, abn SLR;  Ext- neg w/o c/c/e;  Neuro- w/o deficits...  LABS 06/29/16>  FLP- not at goals w/ TG=251;  Chems- ok x BS=138, A1c=6.9 on diet alone & he doesn't want meds (asked to really get on diet etc);  CBC- wnl;  TSH= 1.41  MRI Lumbar spine => pending IMP/PLAN>>  VanLacharlesnts to control his DM w/o meds & we reviewed the diet/ exercise/ wt reduction required!  His FLP is not at goal either & diet is the key here to reduction in the TGs;  His CC is LBP & we discussed rest, heat, Pred dosepak & refer to NS DrJJenkins (his office called & no appt for 445mo71moc to get new MRI Lumbar & get results to them);  OK 2017 FLU vaccine today...  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           Problem List:    BRONCHITIS, RECURRENT (ICD-491.9) - no recent URI symptoms... 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 ~  CXR 2007 when coronary stent placed showed ?sm nodules seen... ~  CT Chest 8/07 showed bilat noncalcif lung nodules, varying size in all lobes & largest= 9.44mm 35mLLL, no adenopathy... note: PSA=10.6 ~  CT Abd&Pelvis 9/07 showed mult sm lung nodules at bases bilat, gallstones, sm duod divertic, scat atherosclerotic calcif, no mass or adenop, asymmetric enlarged prostate...  ~  CT Chest 12/07  showed mult pulm nodules bilat- no change, no adenopathy, coronary calcif...  ~  f/u CXR 8/08 without nodules seen... (note: PSA= 0.03) ~  f/u CXR 11/09 clear, no lesions seen... ~  f/u CXR 2/11 showed clear, NAD.Marland Kitchen. (note: PSA= 0.57) ~  CXR 7/15 shows norm heart size & Ao atherosclerosis, bilat pulm nodules are visualized (new since 2/11 CXR) w/ largest ~2cm in LUL... ~  CT Chest, Abd, Pelvis;  PET Scan;  Nuclear Bone Scan;  CT needle bx of lung lesion =>  SEE ABOVE, all c/w metastatic prostate cancer to lungs... ~  CXR 12/15 (after Lupron shot & 63moof Casodex) showed signif improvement w/ decr in size & number of nodules ~  1/16: he returned w/ URI- cough, sput, chest soreness and we decided to treat w/ ZPak, Hycodan, Mucinex, fluids... ~  CXR 5/16 shows resolution of the prev metastatic nodules, heart size is wnl, NAD..Marland Kitchen  ~  CXR 6/17 showed norm heart size, clear lungs w/o nodules, Tspine degen changes, NAD...  HYPERTENSION (ICD-401.9) - contolled on ATENOLOL '25mg'$ /d, & LISINOPRIL '10mg'$ /d...  ~  9/11:  BP=110/62 here and usually 120s/ 70s at home- denies HA, fatigue, visual changes, CP, palipit, dizziness, syncope, dyspnea, edema, etc... ~  7/14:  on Aten25, Lisin10;  BP= 142/78 & wt=199# (same as 2011); he denies CP, palpit, SOB, dizzy, edema, etc. ~  7/15: on Aten25, Lisin10;  BP= 132/82 & wt=203# (down 5# last 687mo he remains asymptomatic... ~  12/15: on Aten25, Lisin10; BP= 140/90 & wt=202#; he denies CP, palpit, SOB, edema... ~  5/16: on Aten25, Lisin10; BP= 140/90 & he is asymptomatic... Reminded to elim sodium. ~  6/17-12/17:  well controlled on ASA, Aten25 & Lisin10; BP=130/80 range & he denies HA, visual sx, CP, palpit, SOB, edema, etc;...  CAD (ICD-414.00) - followed by DrHochrein... on ASA '325mg'$ /d. ~  NuclearStressTest 8/07 abnormal w/ ant ischemia... ~  cath 8/07 showed 95% mid-LAD stenosis, & 20% lesions in the other 2 vessels, EF=60%... subseq PTCA w/ non-drug eluting stent... ~  OV w/ Cards= 5/10 & note reviewed- BP sl elevated & Lisinopril added. ~  He saw DrHochrein 10/13> doing well, no new symptoms, exam was neg, no change in meds & rec aggressive risk factor reduction strategy...  ~  EKG 10/13 showed NSR, rate62, LAD, otherw wnl, NAD... ~  He saw DrHochrein 10/14- on above + ASA; stable & asymptomatic, no changes made, f/u 1y4yr He saw DrHochrein 10/15> HBP, CAD, HL- stable w/o CP/ palpit/ SOB/ edema/ etc (despite being  diagnosed w/ met prostate ca to lungs)...  DYSLIPIDEMIA (ICD-272.4) - on PRAVACHOL '20mg'$ /d and tol OK...  ~  FLPLynch08 showed TChol 168, TG 143, HDL34, LDL105 ~  FLP 2/09 showed TChol 163, TG 207, HDL 34, LDL 91 ~  FLP 5/10 showed TChol 148, TG 131, HDL 33, LDL 89 ~  FLP 9/11 showed TChol 163, TG 140, HDL 42, LDL 93 ~  FLP 6/14 on Prav20 showed TChol 169, TG 242, HDL 31, LDL 93  ~  FLP 7/15 on Prav20 showed TChol 174, TG 290, HDL 31, LDL 87... We reviewed low fat diet & need for wt reduction. ~  FLP 11/16 on Prav20 showed TChol 169, TG 292, HDL 34, LDL 81... Ditto ~  FLPBrookhaven/17 on Prav20 showed TChol 186, TG 251, HDL 40, LDL 96... Needs better low carb/ low fat/ wt reducing diet.  IFG=> Diabetes Mellitus> ~  Hx BS betw 130-150 on diet alone... ~  6/17:  Labs showed BS=132, A1c=7.4 and we decided to start METFORMIN '500mg'$  Qam => pt took it for 15mo& stopped on his own for dizziness. ~  12/17:  Labs showed BS=138, A1c=6.9; he wants to control on diet alone- we reviewed low carb/ low fat, wt redcing diet...  GERD (ICD-530.81) - prev on OMEP20 but causes diarrhea he says, and ACIPHEX '20mg'$ /d works better...  ~  last EGD (DrPatterson)was 8/04 showing GERD & stricture dilated... ~  5/11: presented to GI w/ recurrent dysphagia- EGD showed 3cmHH, stricture dilated, no Barrett's, changed to Aciphex. ~  On Omep40 & he remains asymptomatic w/o dysphagia, CP, abd pain, n/v, c/d, blood seen...  DIVERTICULOSIS OF COLON (ICD-562.10) >>  ~  Colonoscopy 8/04 by DrPatterson was normal, f/u 159yr(scattered tics seen on 1996 flex) ~  7/14:  He is due for f/u colon 7 we will refer the chart to DrPatterson for review... ~  Colonoscopy 8/14 by DrPatterson showed mod divertics, no polyps, and he rec f/u colon for routine risk ~1021yr.  PROSTATE CANCER (ICD-185) - followed by DrPeterson- elevated PSA found 8/07 (10.6)... biopsies were pos (Stage T2b, bilat dis w/ Gleason4+3=7 on one side & 8 on the other) & there is  a family hx as well; he had bilat pulm nodules on CXR & CT Chest at diagnosis... [facility], then [facility] Rx for 2 yrs... [facility]... prev on TRELSTAR injections q 50mo5monthPROVERA for the hot flashes>> x2yrs56yreg bone scan 1/08;  PSA here= 0.03 in 2008, and CXR cleared w/o nodules seen in 2008, 2009, 2011 (see above)... ~  2/11: we don't have recent note from DrPetManele will request info sent to us [place]. ~  9/11: labs here showed PSA= 0.57 & we [place]/13:  He had f/u visit w/ DrGrapey> PSA had been slowly rising and ~1 when they last checked; DrGrapey rec Q6mo f81mo~  7/14:  Pt missed his 3/14 appt w/ Urology & is sched to see them 8/14; PSA recorded 6/14 = 3.21 & we sent copy to DrGrapey... ~  3/15:  He had f/u DrGrapey> prostate Ca dx 2007, [facility] 86yr[facility] in 2008; slowly rising PSA w/ doubling time ~8mo no69moGrapey follows pt Q6mo & pl101mos for hormone therapy when PSA>10... ~  7/15:  Routine CXR showed return of bilat pulm nodules assoc w/ PSA= 6.15  (CT Chest, Abd, Pelvis is planned & discussion w/ DrGrapey & Oncology)=> Dx w/ metastatic prostate adenocarcinoma (lung mets) ~  9/15: he was started on Lupron shots Q4mo, give58mosodex daily x30d per DrGrapey; f/u OV planned Jan2016...[id number]: CXR showed improvement w/ decr in size & number of lung nodules... He has a follow up appt w/ DrGrapey in Jan2016. ~  He remains under the care of DrGrapey for Urology Q6mo- on LU45mo shots, c/o some hot flashes, Labs show PSA<0.04 & Testos low at 32  DEGENERATIVE JOINT DISEASE (ICD-715.90) >>  RIGHT THIGH AREA PAIN 12/15 >> ?etiology, [facility] & by [facility]... ~  his left knee gives him some pain on and off; he saw DrDaldorf for this 12/08 & he thought there might be a torn meniscus, they decided to Rx  w/ NAPROSEN which helps; may yet need MRI/ arthroscopy... ~  2/11: discussed  trial of Mobic to see if this works for him. 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 They want to see DrLucey for Ortho eval, we will give trial GABAPENTIN100Tid, watch for rash (he had Shingles vaccine 2 mo ago)... ~  1/16: Jeven tells me he went to an Ortho in [place] who did MRI and referred him to [facility]- DrJenkins, told him he could do surg but advised holding off since his pain level is diminished, still taking the Neurontin100Tid & Oxycodone5 prn; we do not have notes from these consultants & he is asked to get records sent to [place] to scan into Epic. ~  6/17:  Durene Fruits reports that he has several frinds on Gabapentin w/ relief of back pain & he requests a trial- OK start w/ '100mg'$  Qhs & incr to '200mg'$  if tol... ~  12/17:  Pt c/o LBP & tried Neurontin w/o relief, Ortho in [place], prev chiro w/o relief => requests referral to [facility], DrJJenkins but no openings for months so we will check Lumbar MRI 7 rx w/ rest, heat, Pred dosepak...  HEALTH MAINTENANCE >>  ~  GI>  Followed by [facility] & last colon was 2004- f/u due now... ~  GU>  Followed by DrGrapey & seen every 45mofor PSA recheck but he missed the 3/14 appt & our PSA 6/14= 3.21 (copy to Urology & he has appt 8/14)... ~  Immuniz>  He gets the yearly flu vax (last 10/13);  Had Pneumovax several yrs ago;  He received the PREVNAR-13 vax 1/15; Not sure of last Tetanus vaccine; Asking about the shingles shot=> [facility]   Past Surgical History:  Procedure Laterality Date  . DISTAL BICEPS TENDON REPAIR Right 03/12/2015   Procedure: REPAIR/RECONSTRUCTION RIGHT BICEPS TENDON;  Surgeon: GDaryll Brod MD;  [facility]  Service: Orthopedics;  Laterality: Right;  . eyelid surgery for Lid Lag    . FOOT SURGERY    . HYDROCELE EXCISION / REPAIR    . INGUINAL HERNIA REPAIR     75yrs old  . lung nodules    . MASTECTOMY     benign tumor  . Prostate treatment     cancer  . SKIN CANCER EXCISION  2012   skin cancer on right ear    Outpatient Encounter Prescriptions as of 06/29/16  Medication Sig  . ALPRAZolam (XANAX) 0.5 MG tablet TAKE ONE-HALF TO 1 TABLET BY MOUTH THREE TIMES DAILY AS NEEDED ANXIETY  . aspirin 81 MG tablet Take 81 mg by mouth daily.  .Marland Kitchenatenolol (TENORMIN) 25 MG tablet TAKE 1 TABLET BY MOUTH EVERY DAY  . b complex vitamins tablet Take 1 tablet by mouth daily.  . Calcium-Magnesium-Vitamin D (CALCIUM 1200+D3 PO) Take 1 tablet by mouth daily.  . Cyanocobalamin (VITAMIN B-12 PO) Take 1 tablet by mouth daily.  .Marland KitchenGLUCOSAMINE SULFATE PO Take 1 tablet by mouth 2 (two) times daily.  .Marland Kitchenlisinopril (PRINIVIL,ZESTRIL) 10 MG tablet TAKE 1 TABLET BY MOUTH EVERY DAY  . Multiple Vitamins-Minerals (MULTIVITAMIN PO) Take 1 tablet by mouth daily.  . Omega-3 Fatty Acids (FISH OIL) 1000 MG CAPS Take 1,000 mg by mouth daily.  .Marland Kitchenomeprazole (PRILOSEC) 40 MG capsule TAKE ONE CAPSULE BY MOUTH EVERY DAY  . oxyCODONE-acetaminophen (  PERCOCET) 10-325 MG tablet Take 1 tablet by mouth every 4 (four) hours as needed for pain.  . pravastatin (PRAVACHOL) 20 MG tablet TAKE 1 TABLET BY MOUTH EVERY DAY  . RABEprazole (ACIPHEX) 20 MG tablet Take 20 mg by mouth daily.    Allergies  Allergen Reactions  . Simvastatin Other (See Comments)    REACTION: pt states INTOL to ZOCOR w/ leg pains    Current Medications, Allergies, Past Medical History, Past Surgical History, Family History, and Social History were reviewed in [employer] record.   Review of Systems         See HPI - The patient denies anorexia, fever, weight loss, weight gain, vision loss, decreased hearing,  hoarseness, chest pain, syncope, dyspnea on exertion, peripheral edema, prolonged cough, headaches, hemoptysis, abdominal pain, melena, hematochezia, severe indigestion/heartburn, hematuria, incontinence, muscle weakness, suspicious skin lesions, transient blindness, difficulty walking, depression, unusual weight change, abnormal bleeding, enlarged lymph nodes, and angioedema.     Objective:   Physical Exam    WD, WN, 75 y/o WM in NAD... GENERAL:  Alert & oriented; pleasant & cooperative... HEENT:  [place]/AT, EOM-wnl, PERRLA, EACs-clear, TMs-wnl, NOSE-clear, THROAT-clear & wnl. NECK:  Supple w/ fairROM; no JVD; normal carotid impulses w/o bruits; no thyromegaly or nodules palpated; no lymphadenopathy. CHEST:  Clear to P & A; without wheezes/ rales/ or rhonchi heard... HEART:  Regular Rhythm; without murmurs/ rubs/ or gallops detected... ABDOMEN:  Soft & nontender; normal bowel sounds; no organomegaly or masses palpated... EXT: without deformities, mild arthritic changes; no varicose veins/ venous insuffic/ or edema.     c/o pain in low back, some SLR difficulty, reflexes symmetric, no rash apparent... NEURO:  CN's intact; motor testing normal; sensory testing normal; gait normal & balance OK. DERM:  No lesions noted; no rash etc...  RADIOLOGY DATA:  Reviewed in the EPIC EMR & discussed w/ the patient...  LABORATORY DATA:  Reviewed in the EPIC EMR & discussed w/ the patient...   Assessment:      Hx Bronchitis, and Mult Pulm Nodules>  His breathing has been fine, at baseline w/o new complaints etc;  Previous mult pulm nodules from 2007 were likely prostate ca mets that resolved w/ his treatment IMRT 2008 and hormone therapy x98yrs;  CXRs in 2008, 2009, & 2011 were clear;  CXR 7/15 showed return of bilat pulm nodules (assoc w/ rising PSA=6.15) & subseq CT Chest, PET scan, Bone scan, Needle bx lung lesion confirmed Dx metastatic prostate cancer in lung. 9/15> DrGrapey started treatment w/ Lupron  Q41mo, Casodex x30d at outset... 12/15> CXR already shows signs of improvement w/ decr size & number of lesions... 5/16> CXR nodules have resolved w/ ongoing treatment for his prostate cancer... 6/17>  He remains on Lupron from DrGrapey w/ PSA reported at zero & CXR remains clear  HBP>  Stable on low dose Aten & Lisin, continue same...  CAD>  Followed by DrHochrein; seen last 10/14 & he remains stable, no changes made; needs to incr his exercise program...  Mixed Dyslipidemia>  On Prav20 but TG & HDL deranged; needs better diet, exercise, & wt reduction...  GI- GERD, Diverics, needs f/u colon>  F/u colon 8/14 was neg x divertics, no polyps, f/u rec for 86yrs...  Prostate Ca>  Managed by DrGrapey as noted; rising PSA indicates recurrent dis and mult lung nodules represent metastatic dis... ~  Now improved on Lupron shots w/ PSA <0.04 & Testos ~32...  DJD>  Hx left knee problems- stable now  w/ OTC analgesics... Right leg pain ?etiology> we treated empirically w/ Neurontin & prn oxycod; he went to see Ortho in , MRI done, referred to [facility]- DrJJenkins & we will call for his notes...  Anxiety>  He is requesting Rx- try ALPRAZOLAM 0.'5mg'$  tid as needed...      Plan:     Patient's Medications  New Prescriptions   No medications on file  Previous Medications   ASPIRIN 81 MG TABLET    Take 81 mg by mouth daily.   ATENOLOL (TENORMIN) 25 MG TABLET    TAKE 1 TABLET BY MOUTH EVERY DAY   ATENOLOL (TENORMIN) 25 MG TABLET    TAKE 1 TABLET BY MOUTH EVERY DAY   ATENOLOL (TENORMIN) 25 MG TABLET    TAKE 1 TABLET BY MOUTH EVERY DAY   B COMPLEX VITAMINS TABLET    Take 1 tablet by mouth daily.   CALCIUM-MAGNESIUM-VITAMIN D (CALCIUM 1200+D3 PO)    Take 1 tablet by mouth daily.   CYANOCOBALAMIN (VITAMIN B-12 PO)    Take 1 tablet by mouth daily.   GABAPENTIN (NEURONTIN) 100 MG CAPSULE    TAKE 1 CAPSULE BY MOUTH EVERY NIGHT AT BEDTIME FOR 3 WEEKS, THEN 2 CAPSULES BY MOUTH EVERY NIGHT AT BEDTIME THEREAFTER    GLUCOSAMINE SULFATE PO    Take 1 tablet by mouth 2 (two) times daily.   LISINOPRIL (PRINIVIL,ZESTRIL) 10 MG TABLET    Take 1 tablet (10 mg total) by mouth daily.   METFORMIN (GLUCOPHAGE) 500 MG TABLET    Take 1 tablet (500 mg total) by mouth daily with breakfast.   MULTIPLE VITAMINS-MINERALS (MULTIVITAMIN PO)    Take 1 tablet by mouth daily.   NAPROXEN SODIUM (ANAPROX) 220 MG TABLET    Take 220 mg by mouth 2 (two) times daily with a meal.   OMEGA-3 FATTY ACIDS (FISH OIL) 1000 MG CAPS    Take 1,000 mg by mouth daily.   OMEPRAZOLE (PRILOSEC) 40 MG CAPSULE    TAKE ONE CAPSULE BY MOUTH EVERY DAY   OXYCODONE-ACETAMINOPHEN (PERCOCET) 10-325 MG TABLET    Take 1 tablet by mouth every 4 (four) hours as needed for pain.   PRAVASTATIN (PRAVACHOL) 20 MG TABLET    TAKE 1 TABLET BY MOUTH EVERY DAY  Modified Medications   Modified Medication Previous Medication   LORAZEPAM (ATIVAN) 1 MG TABLET LORazepam (ATIVAN) 1 MG tablet      Take 1/2-1 tablet PO TID PRN anxiety.    Take 1/2-1 tablet PO TID PRN anxiety.   PRAVASTATIN (PRAVACHOL) 20 MG TABLET pravastatin (PRAVACHOL) 20 MG tablet      TAKE 1 TABLET BY MOUTH EVERY DAY    TAKE 1 TABLET BY MOUTH EVERY DAY  Discontinued Medications   ALPRAZOLAM (XANAX) 0.5 MG TABLET    TAKE ONE-HALF TO 1 TABLET BY MOUTH THREE TIMES DAILY AS NEEDED ANXIETY

## 2016-07-09 ENCOUNTER — Ambulatory Visit (HOSPITAL_COMMUNITY)
Admission: RE | Admit: 2016-07-09 | Discharge: 2016-07-09 | Disposition: A | Discharge status: Home / Self Care | Payer: Medicare Other | Source: Ambulatory Visit | Attending: Pulmonary Disease | Admitting: Pulmonary Disease

## 2016-07-09 DIAGNOSIS — M47816 Spondylosis without myelopathy or radiculopathy, lumbar region: Secondary | ICD-10-CM | POA: Diagnosis not present

## 2016-07-09 DIAGNOSIS — M5126 Other intervertebral disc displacement, lumbar region: Secondary | ICD-10-CM | POA: Diagnosis not present

## 2016-07-09 DIAGNOSIS — M545 Low back pain: Secondary | ICD-10-CM

## 2016-07-09 DIAGNOSIS — G8929 Other chronic pain: Secondary | ICD-10-CM | POA: Insufficient documentation

## 2016-07-12 ENCOUNTER — Telehealth: Payer: Self-pay | Admitting: Pulmonary Disease

## 2016-07-13 NOTE — Telephone Encounter (Signed)
Notes Recorded by Noralee Space, MD on 07/12/2016 at 7:17 AM EST Please notify patient>  MR Lumbar spine shows progressive changes at L2-3 w/ bulging disc & nerve root compression... Let him know we sent copy of report to Dr. Earle Gell... ------------ Spoke with pt, aware of results/recs.  Nothing further needed.

## 2016-08-06 ENCOUNTER — Other Ambulatory Visit: Payer: Self-pay | Admitting: Pulmonary Disease

## 2016-08-10 ENCOUNTER — Other Ambulatory Visit: Payer: Self-pay | Admitting: Pulmonary Disease

## 2016-08-10 MED ORDER — PRAVASTATIN SODIUM 20 MG PO TABS: 20.0000 mg | ORAL_TABLET | Freq: Every day | ORAL | 11 refills | Status: DC

## 2016-08-15 ENCOUNTER — Other Ambulatory Visit: Payer: Self-pay | Admitting: Pulmonary Disease

## 2016-10-17 ENCOUNTER — Other Ambulatory Visit: Payer: Self-pay | Admitting: Pulmonary Disease

## 2016-10-20 DIAGNOSIS — C61 Malignant neoplasm of prostate: Secondary | ICD-10-CM | POA: Diagnosis not present

## 2016-10-31 DIAGNOSIS — C61 Malignant neoplasm of prostate: Secondary | ICD-10-CM | POA: Diagnosis not present

## 2016-11-08 DIAGNOSIS — I10 Essential (primary) hypertension: Secondary | ICD-10-CM | POA: Diagnosis not present

## 2016-11-08 DIAGNOSIS — M48062 Spinal stenosis, lumbar region with neurogenic claudication: Secondary | ICD-10-CM | POA: Diagnosis not present

## 2016-11-08 DIAGNOSIS — M545 Low back pain: Secondary | ICD-10-CM | POA: Diagnosis not present

## 2016-11-08 DIAGNOSIS — M5416 Radiculopathy, lumbar region: Secondary | ICD-10-CM | POA: Diagnosis not present

## 2016-11-16 ENCOUNTER — Other Ambulatory Visit: Payer: Self-pay | Admitting: Pulmonary Disease

## 2016-11-22 ENCOUNTER — Other Ambulatory Visit: Payer: Self-pay | Admitting: Neurosurgery

## 2016-11-22 DIAGNOSIS — M48062 Spinal stenosis, lumbar region with neurogenic claudication: Secondary | ICD-10-CM

## 2016-11-24 ENCOUNTER — Other Ambulatory Visit: Payer: Self-pay | Admitting: Pulmonary Disease

## 2016-12-02 ENCOUNTER — Ambulatory Visit
Admission: RE | Admit: 2016-12-02 | Discharge: 2016-12-02 | Disposition: A | Discharge status: Home / Self Care | Payer: Medicare Other | Source: Ambulatory Visit | Attending: Neurosurgery | Admitting: Neurosurgery

## 2016-12-02 DIAGNOSIS — M48062 Spinal stenosis, lumbar region with neurogenic claudication: Secondary | ICD-10-CM

## 2016-12-02 DIAGNOSIS — M48061 Spinal stenosis, lumbar region without neurogenic claudication: Secondary | ICD-10-CM | POA: Diagnosis not present

## 2016-12-02 MED ORDER — DIAZEPAM 5 MG PO TABS
5.0000 mg | ORAL_TABLET | Freq: Once | ORAL | Status: AC
  Administered 2016-12-02: 5 mg via ORAL

## 2016-12-02 MED ORDER — ONDANSETRON HCL 4 MG/2ML IJ SOLN: 4.0000 mg | Freq: Four times a day (QID) | INTRAMUSCULAR | Status: DC | PRN

## 2016-12-02 MED ORDER — IOPAMIDOL (ISOVUE-M 200) INJECTION 41%
15.0000 mL | Freq: Once | INTRAMUSCULAR | Status: AC
  Administered 2016-12-02: 15 mL via INTRATHECAL

## 2016-12-02 NOTE — Discharge Instructions (Signed)

## 2016-12-15 ENCOUNTER — Other Ambulatory Visit: Payer: Self-pay | Admitting: Pulmonary Disease

## 2016-12-26 ENCOUNTER — Other Ambulatory Visit: Payer: Self-pay | Admitting: Pulmonary Disease

## 2016-12-28 ENCOUNTER — Encounter: Payer: Self-pay | Admitting: Pulmonary Disease

## 2016-12-28 ENCOUNTER — Ambulatory Visit (INDEPENDENT_AMBULATORY_CARE_PROVIDER_SITE_OTHER): Payer: Medicare Other | Admitting: Pulmonary Disease

## 2016-12-28 ENCOUNTER — Other Ambulatory Visit (INDEPENDENT_AMBULATORY_CARE_PROVIDER_SITE_OTHER): Payer: Medicare Other

## 2016-12-28 VITALS — BP 132/70 | HR 46 | Temp 98.2°F | Ht 71.0 in | Wt 194.2 lb

## 2016-12-28 DIAGNOSIS — I2510 Atherosclerotic heart disease of native coronary artery without angina pectoris: Secondary | ICD-10-CM

## 2016-12-28 DIAGNOSIS — M5440 Lumbago with sciatica, unspecified side: Secondary | ICD-10-CM

## 2016-12-28 DIAGNOSIS — I10 Essential (primary) hypertension: Secondary | ICD-10-CM | POA: Diagnosis not present

## 2016-12-28 DIAGNOSIS — C61 Malignant neoplasm of prostate: Secondary | ICD-10-CM

## 2016-12-28 DIAGNOSIS — R7301 Impaired fasting glucose: Secondary | ICD-10-CM

## 2016-12-28 DIAGNOSIS — M150 Primary generalized (osteo)arthritis: Secondary | ICD-10-CM | POA: Diagnosis not present

## 2016-12-28 DIAGNOSIS — M159 Polyosteoarthritis, unspecified: Secondary | ICD-10-CM

## 2016-12-28 DIAGNOSIS — E782 Mixed hyperlipidemia: Secondary | ICD-10-CM | POA: Diagnosis not present

## 2016-12-28 DIAGNOSIS — M8949 Other hypertrophic osteoarthropathy, multiple sites: Secondary | ICD-10-CM

## 2016-12-28 DIAGNOSIS — G8929 Other chronic pain: Secondary | ICD-10-CM

## 2016-12-28 LAB — HEMOGLOBIN A1C: Hgb A1c MFr Bld: 7.1 % — ABNORMAL HIGH (ref 4.6–6.5)

## 2016-12-28 LAB — BASIC METABOLIC PANEL
BUN: 14 mg/dL (ref 6–23)
CALCIUM: 9.4 mg/dL (ref 8.4–10.5)
CO2: 30 meq/L (ref 19–32)
Chloride: 106 mEq/L (ref 96–112)
Creatinine, Ser: 0.79 mg/dL (ref 0.40–1.50)
GFR: 101.54 mL/min (ref >60.00)
Glucose, Bld: 137 mg/dL — ABNORMAL HIGH (ref 70–99)
POTASSIUM: 4.7 meq/L (ref 3.5–5.1)
SODIUM: 141 meq/L (ref 135–145)

## 2016-12-28 MED ORDER — TRAMADOL HCL 50 MG PO TABS: 50.0000 mg | ORAL_TABLET | Freq: Three times a day (TID) | ORAL | 5 refills | Status: DC | PRN

## 2016-12-28 NOTE — Patient Instructions (Addendum)
Today we updated your med list in our EPIC system...    Continue your current medications the same...  We wrote for a new pain med- TRAMADOL 50mg  tabs...    Take one tab with an extra strength tylenol up to 3 times daily as needed for pain...  Today we checked your follow up Metabolic labs & W3T diabetic test...    We will contact you w/ the results when available...   Keep up the good work w/ DIET & EXERCISE...  Call for any questions...  Let's plan a follow up visit in 71mo, sooner if needed for problems.Marland KitchenMarland Kitchen

## 2016-12-28 NOTE — Progress Notes (Signed)
Subjective:     Patient ID: [name], male   DOB: 03/25/1941, 76 y.o.   MRN: [id number]  HPI 76 y/o WM here for a follow up visit... he has multiple medical problems as noted below... [name] was [name] & [name] in the 1960s! ~  SEE PREV EPIC NOTES FOR OLDER DATA >>   ~  February 05, 2013:  66yrROV>  [name] after a long hiatus- being cared for by [name] [employer] Cards, & [name] for Urology;  He has numerous medical issues- addressed below, but returns asking for a nerve pill describing lots of stress w/ his business etc; we discussed trial [id number] as needed...     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    HBP> on Aten25, Lisin10;  BP= 142/78 & wt=199# (same as 2011); he denies CP, palpit, SOB, dizzy, edema, etc...    CAD> rec to take ASA81; followed by [name] & last seen 10/13- stable, no changes made...    Dyslipidemia> on Prav20;  FLP 6/14 showed TChol 169, TG 242, HDL 31, LDL 93 & we reviewed diet, exercise, & rec adding FENOFIBRATE 16063m...     GI- GERD, Divertics> on Omep40;  He denies abd pain, dysphagia, n/v, c/d, blood seen; last colon by [name] was 2004 & he is due for f/u procedure...     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 We reviewed prob list, meds, xrays and labs> see below for updates >>   LABS 6/14:  FLP- chol ok but TG=242 HDL=31;  Chems- wnl;  CBC- wnl;  TSH=1.28;  PSA=3.21 (prev in 2011 it was 0.57)...  ~  August 13, 2013:  40mo R29mo  [name] has gained 9# up to 208# today, he blames it on quitting chewing tobacco but I told him the trade-off was worth it, asked to get on diet & incr exercise... He has no new complaints or concerns...    Breathing is stable but needs to incr exercise program...    BP is controlled w/ Aten25 & Lisin10;  BP=128/78 today & he denies CP, palpit, SOB, edema...    He saw [name] 10/14- on above + ASA; stable & asymptomatic, no changes made, f/u 4yr...16yrLipids treated w/ Prav20 & diet but he's gained wt as noted; intol to Fenofib previously- we reviewed diet, exercise, wt reduction strategies...    He saw [name] for prostate Ca follow up 8/14> treated w/ XRT but slowly rising PSA since then, they are on Observation protocol w/ check ups every 40mo... 19moeviewed prob list, meds, xrays and labs> see below for updates >> Given PREVNAR-13 & Rx for Shingles vaccine...   EKG 10/14 by [name] showed SBrady, rate52, borderline EKG, NAD...   ADDENDUM> PSA 2/15 by [name] was 4.26...   ~  February 10, 2014:  40mo ROV 11moVance Love that he is doing well, no new complaints or concerns;  He has a rising  PSA- followed by [name] Q67mow/ PSADT est ~184mo last measured 4.26 in [id number] He saw Urology 3/15 & note reviewed (prev hx well outlined) and plan was to continue monitoring Q6m38motil PSA ~10 then consider hormone therapy...  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    HBP> on Aten25, Lisin10;  BP= 132/82 & wt=203# (down 5# last 17mo43moe  denies CP, palpit, SOB, dizzy, edema, etc...    CAD> on ASA81; followed by [name] & last seen 10/14- remains stable, no changes made, f/u 54yr.22yr  Dyslipidemia> on Prav20; hx intol to Fenofibrate; FLP 6/14 showed TChol 169, TG 242, HDL 31, LDL 93 & we reviewed diet, exercise, & wt reduction...    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 We reviewed prob list, meds, xrays and labs> see below for updates >>   CXR 7/15 shows norm heart size & Ao atherosclerosis, bilat pulm nodules are visualized (new since 2/11 CXR) w/ largest ~2cm in LUL...    LABS 7/15:  FLP- not at goals w/ TG elev & HDL low;  Chems- ok x BS=135;  CBC- wnl;  TSH=1.38;  PSA=6.15...  CT Chest/ Abd/ Pelvis 7/15 showed numerous bilat pulm nodules; no signif findings in abd/pelvis x gallstones, adrenal adenoma, scat divertics, and atherosclerotic changes; degen changes in spine PLAN>  CXR shows recurrent lung nodules (assoc w/ his rising PSA); prev noted nodules from 2007 were assoc w/ PSA of 10.6 at time of Dx, and the CXR cleared w/o nodules seen in 2008, 2009, 2011; he has been followed by Urology w/ slowly rising PSA at 3.1 last yr and 4.26 earlier this yr, and now=6.15   With f/u CXR showing recurrent mult bilat pulm nodules (also seen at time of Prostate cancer Dx in 2007 & resolved (2008, 2009, 2011) after XRT & hormone therapy at time of dx...   PET Scan 02/20/14 showed numerous bilat  noncalcif pulm nodules, 5 measured w/ max SUV 1.6-4.6, coronary atherosclerosis, no skeletal metssmall right adrenal nodule (adenma favored)...  Nuclear Bone Scan 02/27/14 showed focal areas of uptake in spine (most likely degenerative) & knee/ shoulder- no convincing evid for mets to bone...  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  PLAN>  He has upcoming appt w/ [name] for Urology   ~  June 18, 2014:  126moROV & VMasayukisaw [name] 9/15 who agreed w/ our Dx of an unusual case of Prostate Cancer w/ mult lung metastasis; they started Rx w/ Lupron (planning shots Q434moand used Casodex for the 1st 30d only; he has f/u appt in Jan2016 w/ Urology for PSA & Testos levels; by his account he has been tolerating the Rx well w/o new issues until last week...  He states that on 11/23 he had a "head cold & chest congestion" so he went to the local [facility] & was evaluated & given Omnicef & a steroid shot in right buttock; on 11/24 he awoke w/ pain in his right leg (?hip area & thigh area mostly) & states he "couldn't walk" due to the pain & numbness; on 11/25 he went to his Chiro w/ XRays done (later told they showed "alot of issues in my lower back"); he has an inversion table he's used on & off for yrs but hasn't been on it recently; 11/27 was Thanksgiving & on 11/28 [facility] was [facility] so much [facility] XRays of Lumbar sp w/ extensive degen changes, XRay of right hip w/ min degen arthritis, VenDopplers neg for DVT, no rash presenty, sl tender right hip but good ROM> they told him likely siatica- given Pred taper & Percocet5 which he has taken ~3/d & today notes sl improved...  Still no rash present & exam is unchanged from what is described- min tender, good ROM, essent neg SLR, symmetric reflexes and strength; note- [facility]...  Daughter  requests that he see DrLucey for Ortho eval & we will try to expedite this ASAP for pt...     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     Prostate Cancer w/ lung mets> 9/15 OV by [name] reviewed; pt was started on Lupron shots (planned Q4mo326mogiven Casodex x30d to start; pt has f/u visit sched for [id number] labs (PSA & Testos) and 2nd Lupron injection... We reviewed prob list, meds, xrays and labs> see below for updates >>   CXR 12/15 showed signif improvement w/ decr in size & number of nodules... PLAN>> now w/ new onset of right leg pain- primarily right thigh area w/ some burning as well; no rash present & asked to watch for vesicular eruption & call if any changes; this is the vicinity of meralgia paresthetica & we discussed trial GABAPENTIN start 100mg626m; continue Pred taper given in ER and refilled the Oxycodone5 Tid + constip prophylactic regime w/ Miralax daily & Senakot-S Qhs... Plan ROV 6wks...  ~  July 30, 2014:  6wk ROV & VanceArkeems me he went to an Ortho in [place] who did MRI and referred him to NS- DrJenkins, told him he could do surg but advised holding off since his pain level is diminished, still taking the Neurontin100Tid & Oxycodone5 prn; we do not have notes from these consultants & he is asked to get records sent to us to[place] into Epic...     [name]/o a URI w/ cough, beige sput, sinus drainage, & chest soreness from the cough; he denies f/c/s, denies incr SOB, etc; we decided to treat w/ ZPak, Hycodan, Mucinex, fluids...     He has f/u appt w/ Urology, [name] soon; as noted his last CXR 12/15 showed signif improvement w/ decr  in size & number of nodules (believed to be metastatic prostate cancer)...  We reviewed prob list, meds, xrays and labs> see below for updates >>  PLAN>> we decided to treat w/ ZPak, Hycodan, Mucinex, fluids; he will f/u in 70mow/ repeat  CXR...  ~  Nov 28, 2014:  428moOV & VaDanielaeports that is breathing is good, no problems reported- denies cough, sput, SOB, CP, etc;  His CC remains LBP- pt says MRI reveqaled bulging discs & he is sched for an epid steroid shot per DrJenkins (we still do [place]. He continues to f/u w/ Urology- [name] & pt indicates that he's due for lab work next week & a hormone shot after that- again he is asked to request records be sent to usKorearom his specialists for usKore[place] review & so these records will be avail in EPIC...  EXAM reveals Afeb, VSS, O2sat=99% on RA;  HEENT- neg, mallampati2;  Chest- clear w/o w/r/r;  Heart- RR w/o m/r/g;  Ext- neg w/o c/c/e...   CXR 5/16 shows regression of the bilat lung nodules & now resolved, norm heart size, NAD...  PLAN>>  VaArgeniss stable from the pulmonary standpoint; he has mult specialists attending his various problems & he is reminded to get records sent to us[place] review & scan into Epic...   ~  June 01, 2015:  15m70moV & VanBobports that he is doing well- he remains on Lupron shots per [name]- last seen 04/13/15 & note reviewed- he's had a great response to the Lupron w/ PSA falling to zero & pulmonary metastatic prostate cancer nodules melting away; tolerating the shots well...  He had a ruptured bicep tendon in right arm repaired by DrKuzma 02/2015... 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    HBP> on Aten25, Lisin10;  BP= 132/84 & wt=203# (stable); he denies CP, palpit, SOB, dizzy, edema, etc...     CAD> on ASA81; followed by [name] & last seen 10/15- remains stable, no changes made, overdue for yearly f/u visit w/ Cards...    Dyslipidemia> on Prav20; hx intol to Fenofibrate; FLP 11/16 showed TChol 169, TG 292, HDL 34, LDL 81 & we reviewed better diet, exercise, & wt reduction...    GI- GERD, Divertics> on Omep40;  He denies abd pain, dysphagia, n/v, c/d, blood seen; last colon by [name] was 8/14 & showed mod divertics, no polyps, and he rec f/u colon for routine risk ~10y87yr Hx Prostate Cancer w/ lung mets> followed by [name] w/ hx prostate ca 2007 w/ IMRT & hormone therapy x2yrs33yr has had slowly rising PSA= 3.21 [id number]= 4.26 [id number]= 6.15 Jul2015=> Dx w/ metastatic ca to lungs; treated by [name] w/ Lupron shots and PSA ret to zero w/ resolution of the pulm nodules...    DJD> on OTC analgesics as needed; he has seen [name] in the past for left knee pain...    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 EXAM  reveals Afeb, VSS, O2sat=98% on RA;  HEENT- neg, mallampati2;  Chest- clear w/o w/r/r;  Heart- RR w/o m/r/g;  Abd- soft, nontender, neg;  Ext- neg w/o c/c/e...   LABS 05/2015>  FLP- ok on Prev20 x TG=292;  Chems- ok x BS=147;  CBC- wnl;  TSH=1.41... PSAs checked by [name] 7 reported to be= zero... IMP/PLAN>>  Abubakar is stable on current regimen;  Given 2016 Flu vaccine; rec to continue same and we plan ROV in 77mo..  ~  December 29, 2015:  665moOV & VaAverilleports doing well overall;  He tells me that he has several friends on Gabapentin for back pain & he wants to try it if at all poss- we discussed this med & agreed to Rx w/ 10033mhs=> incr to 200m64m tolerated;  See prob list above...     BP/ CAD well controlled on ASA, Aten25 & Lisin10; BP=134/80 & he denies HA, visual sx, CP, palpit, SOB, edema, etc...    He remains on Prav20 + FishOil and FLP 11/16 showed elev TG- advised same med, better low fat diet,  lose 10 lbs...    He has been trying to control his DM w/ diet alone- but labs today showed BS=132, A1c=7.4 & we decided to start METFORMIN 500mg57m...    He has hx of prostate Ca w/ lung mets- followed by [name] & last seen ~6wks ago- on Lupron shots and pt reports that PSA remains zero!    VanceToussaintDJD, LBP/ bulging discs on Percocet10 prn, using sparingly & ave 1/wk; he tells me that he takes Aleve Bid & wants to try the Gabapentin as above... EXAM reveals Afeb, VSS, O2sat=96% on RA;  HEENT- neg, mallampati2;  Chest- clear w/o w/r/r;  Heart- RR w/o m/r/g;  Abd- soft, nontender, neg;  Ext- neg w/o c/c/e...   CXR 12/29/15 showed norm heart size, clear lungs w/o nodules, Tspine degen changes, NAD...  LABS 12/29/15>  Chems- ok x BS=132;  A1c=7.4... IMMarland KitchenMarland KitchenPLAN>>  VanceSailortable overall w/ his mult medical issues- we decided to start METFORM500 Qam & reviewed low carb diet, need for wt reduction;  We also started trial Gabapentin per his request for back issues;  We plan ROV 79mo, 9moer if needed prn...  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 Jeff JenEarle Gellhiro, told bulging discs & old XRays shHoover Brunetteextensive degen changes in lumbar spine; he had prev MRI in [place] & DrJenkins told him he could do surg but advised holding off since his pain at that time was diminished; Pt recently requested trial of Gabapentin (just like several friends) & now requests f/u appt w/ neurosurg... 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    HBP> on Aten25, Lisin10;  BP= 130/84 & wt=194# (BMI=27); he denies CP, palpit, SOB, dizzy, edema, etc...    CAD> on ASA81; followed by [name] & last seen 10/15- remains stable, no changes made, & overdue for f/u visit w/ Cards...    Dyslipidemia (mixed)> on Prav20; hx intol to Fenofibrate; FLP 12/17 shows TChol 186, TG 251, HDL 40, LDL 96 & we reviewed better low carb/ low fat diet, exercise, & wt reduction...    GI- GERD, Divertics> on Omep40;  He denies abd pain, dysphagia, n/v, c/d, blood seen; last colon by [name] was 8/14 & showed mod divertics, no polyps, and he rec f/u colon for routine risk ~49yr    Hx Prostate Cancer w/ lung mets> followed by [name] w/ hx prostate ca 2007 w/ IMRT & hormone therapy x285yr he has had slowly rising PSA= 3.21 [id number]= 4.26 [id number]= 6.15 Jul2015=> Dx w/ metastatic ca to lungs; treated by [name] w/ Lupron shots and PSA ret to zero w/ resolution of the pulm nodules...    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 EXAM reveals Afeb, VSS, O2sat=96% on RA;  HEENT- neg, mallampati2;  Chest- clear w/o w/r/r;  Heart- RR w/o m/r/g;  Abd- soft, nontender, neg;  Back- sl tender, decr  ROM, abn SLR;  Ext- neg w/o c/c/e;  Neuro- w/o deficits...  LABS 06/29/16>  FLP- not at goals w/ TG=251;  Chems- ok x BS=138, A1c=6.9 on diet alone & he doesn't want meds (asked to really get on diet etc);  CBC- wnl;  TSH= 1.41  MRI Lumbar spine => pending IMP/PLAN>>  VancRicardts to control his DM w/o meds & we reviewed the diet/ exercise/ wt reduction required!  His FLP is not at goal either & diet is the key here to reduction in the TGs;  His CC is LBP & we discussed rest, heat, Pred dosepak & refer to NS DrJJenkins (his office called & no appt for 60mo-18mo to get new MRI Lumbar & get results to them);  OK 2017 FLU vaccine today...  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    ~  December 28, 2016:  55mo R59mo [name] Jaquariousts stable- "just my back" meaning his LBP, worse w/ activity etc; he has seen [name] for Ortho, DrJenkins for NS, and a chiro; he tells me [place] we set him up to see DrJenkins Neurosurg & he wanted us to [place] 1st- this was done 06/2016 w/ progressive spondylosis L2-3 w/ bulging disc & nerve root compression in the thecal sac=> I do not have recent notes from him; pt reports "shot" w/o much relief prev;  We discussed Tramadol + Tylenol Rx...  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    HBP>  on Aten25, Lisin10;  BP= 132/70 & wt=194# (BMI=27); he denies CP, palpit, SOB, dizzy, edema, etc...    CAD> on ASA81; followed by [name] & last seen 10/15- remains stable, no changes made, & overdue for f/u visit w/ Cards...    Dyslipidemia (mixed)> on Prav20; hx intol to Fenofibrate; FLP 12/17 shows TChol 186, TG 251, HDL 40, LDL 96 & we reviewed better low carb/ low fat diet, exercise, & wt reduction...    GI- GERD, Divertics> on Omep40;  He denies abd pain, dysphagia, n/v, c/d, blood seen; last colon by [name] was 8/14 & showed mod divertics, no polyps, and he rec f/u colon for routine risk ~67yr    Hx Prostate Cancer w/ lung mets> followed by [name] w/ hx prostate ca 2007 w/ IMRT & hormone therapy x217yr he has had slowly rising PSA= 3.21 [id number]= 4.26 FeRWE3154PSA= 6.15 Jul2015=> Dx w/ metastatic ca to lungs; treated by [name] w/ Lupron shots and PSA ret to zero w/ resolution of the pulm nodules (getting Lupron Q6m65mo.    DJD> on OTC analgesics as needed; he has seen [name] in the past for left knee pain, DrJJenkins and Chiro for LBP w/ prev MRI in [place] (?results); he presented 12/17 w/ worsening/ persistent LBP & requesting NS referral => DrJenkins booked for 58mo21moequested us [place] Lumbar [place] w/ copy to him; Rx w/ rest/ heat/ Gabapentin/ Pred dosepak...    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 EXAM reveals Afeb, VSS, O2sat=96% on RA;  HEENT- neg, mallampati2;  Chest- clear w/o w/r/r;  Heart- RR w/o m/r/g;  Abd- soft, nontender, neg;  Back- sl tender, decr ROM, abn SLR;  Ext- neg w/o c/c/e;  Neuro- w/o deficits...  LABS 12/28/16>  Chems- ok w/ BS=137, Cr=0.79;  HgA1c=7.1 and we discussed MetformER 500mg 89m.. IMP/PLAN>>  Harout's CC is his back pain, under the care of NS- DrJenkins and he is awaiting f/u visit to discuss options;  We will Rx w/ Tramadol50 + tylenol;  Labs showed worsening BS/ A1c=> mild DM & rec  to start MetformER500;  He continues to f/u w/ NS & Urology;  Cardiac stable w/o CP, palpit, etc...           Problem List:    BRONCHITIS, RECURRENT (ICD-491.9) - no recent URI symptoms... 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 ~  CXR 2007 when coronary stent placed showed ?sm nodules seen... ~  CT Chest 8/07 showed bilat noncalcif lung nodules, varying size in all lobes & largest= 9.4mm in66mL, no adenopathy... note: PSA=10.6 ~  CT Abd&Pelvis 9/07 showed mult sm lung nodules at bases bilat, gallstones, sm duod divertic, scat atherosclerotic calcif, no mass or adenop, asymmetric enlarged prostate...  ~  CT Chest 12/07 showed mult pulm nodules bilat- no change, no adenopathy, coronary calcif...  ~  f/u CXR  8/08 without nodules seen... (note: PSA= 0.03) ~  f/u CXR 11/09 clear, no lesions seen... ~  f/u CXR 2/11 showed clear, NAD.Marland Kitchen. (note: PSA= 0.57) ~  CXR 7/15 shows norm heart size & Ao atherosclerosis, bilat pulm nodules are visualized (new since 2/11 CXR) w/ largest ~2cm in LUL... 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 ~  CXR 12/15 (after Lupron shot & 436moof Casodex) showed signif improvement w/ decr in size & number of nodules ~  1/16: he returned w/ URI- cough, sput, chest soreness and we decided to treat w/ ZPak, Hycodan, Mucinex, fluids... ~  CXR 5/16 shows resolution of the prev metastatic nodules, heart size is wnl, NAD..Marland Kitchen  ~  CXR 6/17 showed norm heart size, clear lungs w/o nodules, Tspine degen changes, NAD...  HYPERTENSION (ICD-401.9) - contolled on ATENOLOL 270md, & LISINOPRIL 1028m...  ~  9/11:  BP=110/62 here and usually 120s/ 70s at home- denies HA, fatigue, visual changes, CP, palipit, dizziness, syncope, dyspnea, edema, etc... ~  7/14:  on Aten25, Lisin10;  BP= 142/78 & wt=199# (same as 2011);  he denies CP, palpit, SOB, dizzy, edema, etc. ~  7/15: on Aten25, Lisin10;  BP= 132/82 & wt=203# (down 5# last 36mo336moe remains asymptomatic... ~  12/15: on Aten25, Lisin10; BP= 140/90 & wt=202#; he denies CP, palpit, SOB, edema... ~  5/16: on Aten25, Lisin10; BP= 140/90 & he is asymptomatic... Reminded to elim sodium. ~  6/17-12/17:  well controlled on ASA, Aten25 & Lisin10; BP=130/80 range & he denies HA, visual sx, CP, palpit, SOB, edema, etc;...  CAD (ICD-414.00) - followed by [name]... on ASA 325mg21m~  NuclearStressTest 8/07 abnormal w/ ant ischemia... ~  cath 8/07 showed 95% mid-LAD stenosis, & 20% lesions in the other 2 vessels, EF=60%... subseq PTCA w/ non-drug eluting stent... ~  OV w/ Cards= 5/10 & note reviewed- BP sl elevated & Lisinopril added. ~  He saw [name] 10/13> doing well, no new symptoms, exam was neg, no change in meds & rec aggressive risk factor reduction strategy...  ~  EKG 10/13 showed NSR, rate62, LAD, otherw wnl, NAD... ~  He saw [name] 10/14- on above + ASA; stable & asymptomatic, no changes made, f/u 70yr ~38yr saw [name] 10/15> HBP, CAD, HL- stable w/o CP/ palpit/ SOB/ edema/ etc (despite being diagnosed w/ met prostate ca to lungs)...  DYSLIPIDEMIA (ICD-272.4) - on PRAVACHOL 20mg/d26m tol OK...  ~  FLP 8/0Poincianahowed TChol 168, TG 143, HDL34, LDL105 ~  FLP 2/09 showed TChol 163, TG 207, HDL 34, LDL 91 ~  FLP 5/10 showed TChol 148, TG 131, HDL 33, LDL 89 ~  FLP 9/11 showed TChol 163, TG 140, HDL 42, LDL 93 ~  FLP 6/14 on Prav20 showed TChol 169, TG 242, HDL 31, LDL 93  ~  FLP 7/15 on Prav20 showed TChol 174, TG 290, HDL 31, LDL 87... We reviewed low fat diet & need for wt reduction. ~  FLP 11/16 on Prav20 showed TChol 169, TG 292, HDL 34, LDL 81... Ditto ~  FLP 12/[place] Prav20 showed TChol 186, TG 251, HDL 40, LDL 96... Needs better low carb/ low fat/ wt reducing diet.  IFG=> Diabetes Mellitus> ~  Hx BS betw 130-150 on diet alone... ~  6/17:   Labs showed BS=132, A1c=7.4 and we decided to start METFORMIN 500mg Qa27m  pt took it for 66mo& stopped on his own for dizziness. ~  12/17:  Labs showed BS=138, A1c=6.9; he wants to control on diet alone- we reviewed low carb/ low fat, wt redcing diet...  GERD (ICD-530.81) - prev on OMEP20 but causes diarrhea he says, and ACIPHEX 241md works better...  ~  last EGD ([name])was 8/04 showing GERD & stricture dilated... ~  5/11: presented to GI w/ recurrent dysphagia- EGD showed 3cmHH, stricture dilated, no Barrett's, changed to Aciphex. ~  On Omep40 & he remains asymptomatic w/o dysphagia, CP, abd pain, n/v, c/d, blood seen...  DIVERTICULOSIS OF COLON (ICD-562.10) >>  ~  Colonoscopy 8/04 by [name] was normal, f/u 1052yrscattered tics seen on 1996 flex) ~  7/14:  He is due for f/u colon 7 we will refer the chart to [name] for review... ~  Colonoscopy 8/14 by [name] showed mod divertics, no polyps, and he rec f/u colon for routine risk ~10y62yr  PROSTATE CANCER (ICD-185) - followed by DrPeterson- elevated PSA found 8/07 (10.6)... biopsies were pos (Stage T2b, bilat dis w/ Gleason4+3=7 on one side & 8 on the other) & there is a family hx as well; he had bilat pulm nodules on CXR & CT Chest at diagnosis... [facility], then hormonal Rx for 2 yrs... [facility] prev on TRELSTAR injections q 3mon45monthROVERA for the hot flashes>> x2yrs;85yrg bone scan 1/08;  PSA here= 0.03 in 2008, and CXR cleared w/o nodules seen in 2008, 2009, 2011 (see above)... ~  2/11: we don't have recent note from DrPeteCorazonwill request info sent to us at [place]. ~  9/11: labs here showed PSA= 0.57 & we will forward to DrPeteAnna13:  He had f/u visit w/ [name]> PSA had been slowly rising and ~1 when they last checked; [name] rec Q6mo f/64mo  7/14:  Pt missed his 3/14 appt w/ Urology & is sched to see them 8/14; PSA recorded 6/14 =  3.21 & we sent copy to [name]... ~  3/15:  He had f/u [name]> prostate Ca dx 2007, [facility]) &102yrT in 2008; slowly rising PSA w/ doubling time ~82mo now55morapey follows pt Q6mo & pla80mo for hormone therapy when PSA>10... ~  7/15:  Routine CXR showed return of bilat pulm nodules assoc w/ PSA= 6.15  (CT Chest, Abd, Pelvis is planned & discussion w/ [name] & Oncology)=> Dx w/ metastatic prostate adenocarcinoma (lung mets) ~  9/15: he was started on Lupron shots Q4mo, given83moodex daily x30d per [name]; f/u OV planned Jan2016... [id number]: CXR showed improvement w/ decr in size & number of lung nodules... He has a follow up appt w/ [name] in Jan2016. ~  He remains under the care of [name] for Urology Q6mo- on LUP28moshots, c/o some hot flashes, Labs show PSA<0.04 & Testos low at 32  DEGENERATIVE JOINT DISEASE (ICD-715.90) >>  RIGHT THIGH AREA PAIN 12/15 >> ?etiology, [facility] [place] [facility] & by [facility]... ~  his left knee gives him some pain on and off; he saw [name] for this 12/08 & he thought there might be a torn meniscus, they decided to Rx w/ NAPROSEN which helps; may yet need MRI/ arthroscopy... ~  2/11: discussed trial of Mobic to see if this works for him. ~  12/15: presented w/ right  thigh area pain ?etiology after eval at [facility], by Chiropractor, by [employer]; XRays w/ extensive degen changes in lumbar spine; sl improved w/ Pred & Percocet from [place]; no rash seen, ?could this be meralgia paresthetica? They want to see DrLucey for Ortho eval, we will give trial GABAPENTIN100Tid, watch for rash (he had Shingles vaccine 2 mo ago)... ~  1/16: Aquarius tells me he went to an Ortho in [place] who did MRI and referred him to NS- DrJenkins, told him he could do surg but advised holding off since his pain level is diminished, still taking the Neurontin100Tid & Oxycodone5 prn; we  do not have notes from these consultants & he is asked to get records sent to [place] to scan into Epic. ~  6/17:  Durene Fruits reports that he has several frinds on Gabapentin w/ relief of back pain & he requests a trial- OK start w/ 159m Qhs & incr to 2051mif tol... ~  12/17:  Pt c/o LBP & tried Neurontin w/o relief, Ortho in [place], prev chiro w/o relief => requests referral to NS, DrJJenkins but no openings for months so we will check Lumbar MRI 7 rx w/ rest, heat, Pred dosepak...  HEALTH MAINTENANCE >>  ~  GI>  [facility]- f/u due now... ~  GU>  Followed by [name] & seen every 67m48mor PSA recheck but he missed the 3/14 appt & our PSA 6/14= 3.21 (copy to Urology & he has appt 8/14)... ~  Immuniz>  He gets the yearly flu vax (last 10/13);  Had Pneumovax several yrs ago;  He received the PREVNAR-13 vax 1/15; Not sure of last Tetanus vaccine; Asking about the shingles shot=> Rx written & [facility]...   Past Surgical History:  Procedure Laterality Date  . DISTAL BICEPS TENDON REPAIR Right 03/12/2015   Procedure: REPAIR/RECONSTRUCTION RIGHT BICEPS TENDON;  Surgeon: GarDaryll BrodD;  [facility];  Laterality: Right;  . eyelid surgery for Lid Lag    . FOOT SURGERY    . HYDROCELE EXCISION / REPAIR    . INGUINAL HERNIA REPAIR     [age]s old  . lung nodules    . MASTECTOMY     benign tumor  . Prostate treatment     cancer  . SKIN CANCER EXCISION  2012   skin cancer on right ear    Outpatient Encounter Prescriptions as of 12/28/16  Medication Sig  . ALPRAZolam (XANAX) 0.5 MG tablet TAKE ONE-HALF TO 1 TABLET BY MOUTH THREE TIMES DAILY AS NEEDED ANXIETY  . aspirin 81 MG tablet Take 81 mg by mouth daily.  . aMarland Kitchenenolol (TENORMIN) 25 MG tablet TAKE 1 TABLET BY MOUTH EVERY DAY  . b complex vitamins tablet Take 1 tablet by mouth daily.  . Calcium-Magnesium-Vitamin D (CALCIUM 1200+D3 PO) Take 1 tablet  by mouth daily.  . Cyanocobalamin (VITAMIN B-12 PO) Take 1 tablet by mouth daily.  . GMarland KitchenUCOSAMINE SULFATE PO Take 1 tablet by mouth 2 (two) times daily.  . lMarland Kitchensinopril (PRINIVIL,ZESTRIL) 10 MG tablet TAKE 1 TABLET BY MOUTH EVERY DAY  . Multiple Vitamins-Minerals (MULTIVITAMIN PO) Take 1 tablet by mouth daily.  . Omega-3 Fatty Acids (FISH OIL) 1000 MG CAPS Take 1,000 mg by mouth daily.  . oMarland Kitcheneprazole (PRILOSEC) 40 MG capsule TAKE ONE CAPSULE BY MOUTH EVERY DAY  . oxyCODONE-acetaminophen (PERCOCET) 10-325 MG tablet Take 1 tablet by mouth every 4 (four) hours as needed  for pain.  . pravastatin (PRAVACHOL) 20 MG tablet TAKE 1 TABLET BY MOUTH EVERY DAY  . RABEprazole (ACIPHEX) 20 MG tablet Take 20 mg by mouth daily.    Allergies  Allergen Reactions  . Simvastatin Other (See Comments)     pt states INTOL to ZOCOR w/ leg pains    Immunization History  Administered Date(s) Administered  . Influenza Split 05/08/2012, 06/03/2013  . Influenza Whole 08/19/2009  . Influenza, High Dose Seasonal PF 06/29/2016  . Influenza,inj,Quad PF,36+ Mos 04/18/2014, 06/01/2015  . Pneumococcal Conjugate-13 08/13/2013  . Tdap 02/10/2014    Current Medications, Allergies, Past Medical History, Past Surgical History, Family History, and Social History were reviewed in [employer] record.   Review of Systems         See HPI - The patient denies anorexia, fever, weight loss, weight gain, vision loss, decreased hearing, hoarseness, chest pain, syncope, dyspnea on exertion, peripheral edema, prolonged cough, headaches, hemoptysis, abdominal pain, melena, hematochezia, severe indigestion/heartburn, hematuria, incontinence, muscle weakness, suspicious skin lesions, transient blindness, difficulty walking, depression, unusual weight change, abnormal bleeding, enlarged lymph nodes, and angioedema.     Objective:   Physical Exam    WD, WN, 76 y/o WM in NAD... GENERAL:  Alert & oriented; pleasant &  cooperative... HEENT:  Teague/AT, EOM-wnl, PERRLA, EACs-clear, TMs-wnl, NOSE-clear, THROAT-clear & wnl. NECK:  Supple w/ fairROM; no JVD; normal carotid impulses w/o bruits; no thyromegaly or nodules palpated; no lymphadenopathy. CHEST:  Clear to P & A; without wheezes/ rales/ or rhonchi heard... HEART:  Regular Rhythm; without murmurs/ rubs/ or gallops detected... ABDOMEN:  Soft & nontender; normal bowel sounds; no organomegaly or masses palpated... EXT: without deformities, mild arthritic changes; no varicose veins/ venous insuffic/ or edema.     c/o pain in low back, some SLR difficulty, reflexes symmetric, no rash apparent... NEURO:  CN's intact; motor testing normal; sensory testing normal; gait normal & balance OK. DERM:  No lesions noted; no rash etc...  RADIOLOGY DATA:  Reviewed in the EPIC EMR & discussed w/ the patient...  LABORATORY DATA:  Reviewed in the EPIC EMR & discussed w/ the patient...   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   Hx Bronchitis, and Mult Pulm Nodules>  His breathing has been fine, at baseline w/o new complaints etc;  Previous mult pulm nodules from 2007 were likely prostate ca mets that resolved w/ his treatment IMRT 2008 and hormone therapy x56yr;  CXRs in 2008, 2009, & 2011 were clear;  CXR 7/15 showed return of bilat pulm nodules (assoc w/ rising PSA=6.15) & subseq CT Chest, PET scan, Bone scan, Needle bx lung lesion confirmed Dx metastatic prostate cancer in lung. 9/15> [name] started treatment w/ Lupron Q43moCasodex x30d at outset... 12/15> CXR already shows signs of improvement w/ decr size & number of lesions... 5/16> CXR nodules have resolved w/ ongoing treatment for his  prostate cancer... 6/17>  He remains on Lupron from [name] w/ PSA reported at zero & CXR remains clear  HBP>  Stable on low dose Aten & Lisin, continue same...  CAD>  Followed by [name]; seen last 10/14 & he remains  stable, no changes made; needs to incr his exercise program...  Mixed Dyslipidemia>  On Prav20 but TG & HDL deranged; needs better diet, exercise, & wt reduction...  GI- GERD, Diverics, needs f/u colon>  F/u colon 8/14 was neg x divertics, no polyps, f/u rec for 51yr...  Prostate Ca>  Managed by [name] as noted; rising PSA indicates recurrent dis and mult lung nodules represent metastatic dis... ~  Now improved on Lupron shots w/ PSA <0.04 & Testos ~32...  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 Right leg pain ?etiology> we treated empirically w/ Neurontin & prn oxycod; he went to see Ortho in [place], MRI done, referred to NS- DrJJenkins & we will call for his notes...  Anxiety>  He is requesting Rx- try ALPRAZOLAM 0.536mtid as needed...      Plan:     Patient's Medications  New Prescriptions   TRAMADOL (ULTRAM) 50 MG TABLET    Take 1 tablet (50 mg total) by mouth 3 (three) times daily as needed.  Previous Medications   ASPIRIN 81 MG TABLET    Take 81 mg by mouth daily.   ATENOLOL (TENORMIN) 25 MG TABLET    TAKE 1 TABLET BY MOUTH EVERY DAY   B COMPLEX VITAMINS TABLET    Take 1 tablet by mouth daily.   CALCIUM-MAGNESIUM-VITAMIN D (CALCIUM 1200+D3 PO)    Take 1 tablet by mouth daily.   CYANOCOBALAMIN (VITAMIN B-12 PO)    Take 1 tablet by mouth daily.   GABAPENTIN (NEURONTIN) 100 MG CAPSULE    TAKE 1 CAPSULE BY MOUTH EVERY NIGHT AT BEDTIME FOR 3 WEEKS, THEN 2 CAPSULES BY MOUTH EVERY NIGHT AT BEDTIME THEREAFTER   GLUCOSAMINE SULFATE PO    Take 1 tablet by mouth 2 (two) times daily.   LISINOPRIL (PRINIVIL,ZESTRIL) 10 MG TABLET    TAKE 1 TABLET BY MOUTH EVERY DAY   LORAZEPAM (ATIVAN) 1 MG TABLET    Take 1/2-1 tablet PO TID PRN anxiety.    MELOXICAM (MOBIC) 15 MG TABLET    Take 15 mg by mouth daily.   MULTIPLE VITAMINS-MINERALS (MULTIVITAMIN PO)    Take 1 tablet by mouth daily.   NAPROXEN SODIUM (ANAPROX) 220 MG TABLET    Take 220 mg by mouth 2 (two) times daily with a meal.   OMEGA-3 FATTY ACIDS (FISH OIL) 1000 MG CAPS    Take 1,000 mg by mouth daily.   OMEPRAZOLE (PRILOSEC) 40 MG CAPSULE    TAKE ONE CAPSULE BY MOUTH EVERY DAY   PRAVASTATIN (PRAVACHOL) 20 MG TABLET    Take 1 tablet (20 mg total) by mouth daily.  Modified Medications   No medications on file  Discontinued Medications   ATENOLOL (TENORMIN) 25 MG TABLET    TAKE 1 TABLET BY MOUTH EVERY DAY   GABAPENTIN (NEURONTIN) 100 MG CAPSULE    TAKE 1 CAPSULE BY MOUTH EVERY NIGHT AT BEDTIME FOR 3 WEEKS, THEN 2 CAPSULES BY MOUTH EVERY NIGHT AT BEDTIME THEREAFTER   PRAVASTATIN (PRAVACHOL) 20 MG TABLET    TAKE 1 TABLET BY MOUTH EVERY DAY

## 2016-12-29 NOTE — Progress Notes (Signed)
LMTCB

## 2016-12-30 ENCOUNTER — Telehealth: Payer: Self-pay | Admitting: Pulmonary Disease

## 2016-12-30 DIAGNOSIS — E118 Type 2 diabetes mellitus with unspecified complications: Secondary | ICD-10-CM

## 2016-12-30 NOTE — Progress Notes (Signed)
See phone note 12/30/16

## 2016-12-30 NOTE — Telephone Encounter (Signed)
Per SN- call in Metformin ER 500 mg # 30 once daily with 11 refills  Tell him to return for labs only first wk in Sept 2018  I have already placed order for [facility] and [facility]  He will need to be advised to fast  LMTCB

## 2016-12-30 NOTE — Telephone Encounter (Signed)
Noralee Space, MD  Elie Confer, CMA        Please notify patient>   Chems show BS=137 & A1c=7.1 indicating mild DM & he probably needs to start medication!!!  His only option is a much better low carb diet- no sugars, no sweets, no pastas & breads, nothing made w/ white flour (pancakes, breads, etc)... Ask him to let me know if he thinks he can do it w/ diet alone, or is he ready to start a step-one DM medication?    LMTCB

## 2016-12-30 NOTE — Telephone Encounter (Signed)
Spoke with the pt  I notified of recs per SN  He verbalized understanding  States that he wants to get started on med  He uses the [facility] in [place]  Please advise thanks!

## 2017-01-02 NOTE — Telephone Encounter (Signed)
Attempted to call the pt but no VM and no answer.  Will try back later.

## 2017-01-03 NOTE — Telephone Encounter (Signed)
ATC patient. No VM. Will attempt again later.

## 2017-01-04 NOTE — Telephone Encounter (Signed)
lmtcb x2 for pt. 

## 2017-01-05 MED ORDER — METFORMIN HCL ER 500 MG PO TB24: 500.0000 mg | ORAL_TABLET | Freq: Every day | ORAL | 11 refills | Status: DC

## 2017-01-05 NOTE — Telephone Encounter (Signed)
Called and spoke with pt and he is aware of SN recs.  He is aware to come in the first week of sept for repeat labs after starting on the medication.

## 2017-01-15 ENCOUNTER — Other Ambulatory Visit: Payer: Self-pay | Admitting: Pulmonary Disease

## 2017-01-24 ENCOUNTER — Other Ambulatory Visit: Payer: Self-pay | Admitting: Pulmonary Disease

## 2017-01-27 ENCOUNTER — Other Ambulatory Visit: Payer: Self-pay | Admitting: Pulmonary Disease

## 2017-01-30 ENCOUNTER — Telehealth: Payer: Self-pay | Admitting: Pulmonary Disease

## 2017-01-30 NOTE — Telephone Encounter (Signed)
Called pt notifying him of the Rx refill request. Told pt that we had the Rx ready for him up front to be picked up at his convenience.  Nothing further needed.

## 2017-02-13 ENCOUNTER — Other Ambulatory Visit: Payer: Self-pay | Admitting: Pulmonary Disease

## 2017-02-21 ENCOUNTER — Other Ambulatory Visit: Payer: Self-pay | Admitting: Pulmonary Disease

## 2017-03-07 DIAGNOSIS — M5136 Other intervertebral disc degeneration, lumbar region: Secondary | ICD-10-CM | POA: Diagnosis not present

## 2017-03-07 DIAGNOSIS — Z6826 Body mass index (BMI) 26.0-26.9, adult: Secondary | ICD-10-CM | POA: Diagnosis not present

## 2017-03-07 DIAGNOSIS — M545 Low back pain: Secondary | ICD-10-CM | POA: Diagnosis not present

## 2017-03-14 DIAGNOSIS — M545 Low back pain: Secondary | ICD-10-CM | POA: Diagnosis not present

## 2017-03-21 DIAGNOSIS — M545 Low back pain: Secondary | ICD-10-CM | POA: Diagnosis not present

## 2017-03-23 DIAGNOSIS — M545 Low back pain: Secondary | ICD-10-CM | POA: Diagnosis not present

## 2017-03-30 DIAGNOSIS — M545 Low back pain: Secondary | ICD-10-CM | POA: Diagnosis not present

## 2017-04-04 DIAGNOSIS — M545 Low back pain: Secondary | ICD-10-CM | POA: Diagnosis not present

## 2017-04-06 DIAGNOSIS — M545 Low back pain: Secondary | ICD-10-CM | POA: Diagnosis not present

## 2017-04-11 DIAGNOSIS — M545 Low back pain: Secondary | ICD-10-CM | POA: Diagnosis not present

## 2017-04-13 DIAGNOSIS — M545 Low back pain: Secondary | ICD-10-CM | POA: Diagnosis not present

## 2017-04-20 DIAGNOSIS — M545 Low back pain: Secondary | ICD-10-CM | POA: Diagnosis not present

## 2017-04-24 DIAGNOSIS — C61 Malignant neoplasm of prostate: Secondary | ICD-10-CM | POA: Diagnosis not present

## 2017-04-25 DIAGNOSIS — M545 Low back pain: Secondary | ICD-10-CM | POA: Diagnosis not present

## 2017-04-30 ENCOUNTER — Other Ambulatory Visit: Payer: Self-pay | Admitting: Pulmonary Disease

## 2017-05-01 DIAGNOSIS — Z5111 Encounter for antineoplastic chemotherapy: Secondary | ICD-10-CM | POA: Diagnosis not present

## 2017-05-01 DIAGNOSIS — C61 Malignant neoplasm of prostate: Secondary | ICD-10-CM | POA: Diagnosis not present

## 2017-05-01 DIAGNOSIS — C7801 Secondary malignant neoplasm of right lung: Secondary | ICD-10-CM | POA: Diagnosis not present

## 2017-05-14 ENCOUNTER — Other Ambulatory Visit: Payer: Self-pay | Admitting: Pulmonary Disease

## 2017-05-25 ENCOUNTER — Telehealth: Payer: Self-pay | Admitting: Pulmonary Disease

## 2017-05-25 NOTE — Telephone Encounter (Signed)
Refill for Ativan. Last refill  LORazepam (ATIVAN) 1 MG tablet [[id number]]  Order Details  Dose, Route, Frequency: As Directed   Dispense Quantity: 90 tablet Refills: 0 Fills remaining: --        Sig: TAKE ONE-HALF TO 1 TABLET BY MOUTH THREE TIMES DAILY AS NEEDED FOR ANXIETY       Written Date: 05/02/17 Expiration Date: 10/29/17    Start Date: 05/02/17 End Date: --         Ordering Provider:  Noralee Space, MD DEA #:  [id number] NPI:  [id number]   Authorizing Provider:  Noralee Space, MD DEA #:  [id number] NPI:  [id number]   Ordering User:  Riley Kill, CMA       Please advise SN.

## 2017-05-26 MED ORDER — LORAZEPAM 1 MG PO TABS: ORAL_TABLET | ORAL | 5 refills | Status: DC

## 2017-05-26 NOTE — Telephone Encounter (Signed)
Patient returned call, was advised refill had been sent to pharmacy.  No call back needed.

## 2017-05-26 NOTE — Telephone Encounter (Signed)
Refill has been called to the pharmacy.  Unable to reach pt at this time.  Will try back later.

## 2017-05-28 ENCOUNTER — Other Ambulatory Visit: Payer: Self-pay | Admitting: Pulmonary Disease

## 2017-06-19 DIAGNOSIS — H524 Presbyopia: Secondary | ICD-10-CM | POA: Diagnosis not present

## 2017-06-22 ENCOUNTER — Other Ambulatory Visit (INDEPENDENT_AMBULATORY_CARE_PROVIDER_SITE_OTHER): Payer: Medicare Other

## 2017-06-22 DIAGNOSIS — E118 Type 2 diabetes mellitus with unspecified complications: Secondary | ICD-10-CM

## 2017-06-22 LAB — HEMOGLOBIN A1C: Hgb A1c MFr Bld: 6.3 % (ref 4.6–6.5)

## 2017-06-22 LAB — BASIC METABOLIC PANEL
BUN: 16 mg/dL (ref 6–23)
CHLORIDE: 105 meq/L (ref 96–112)
CO2: 28 meq/L (ref 19–32)
Calcium: 9.1 mg/dL (ref 8.4–10.5)
Creatinine, Ser: 0.83 mg/dL (ref 0.40–1.50)
GFR: 95.79 mL/min (ref >60.00)
GLUCOSE: 115 mg/dL — AB (ref 70–99)
POTASSIUM: 4.5 meq/L (ref 3.5–5.1)
Sodium: 140 mEq/L (ref 135–145)

## 2017-06-29 ENCOUNTER — Ambulatory Visit (INDEPENDENT_AMBULATORY_CARE_PROVIDER_SITE_OTHER)
Admission: RE | Admit: 2017-06-29 | Discharge: 2017-06-29 | Disposition: A | Discharge status: Home / Self Care | Payer: Medicare Other | Source: Ambulatory Visit | Attending: Pulmonary Disease | Admitting: Pulmonary Disease

## 2017-06-29 ENCOUNTER — Encounter: Payer: Self-pay | Admitting: Pulmonary Disease

## 2017-06-29 ENCOUNTER — Ambulatory Visit: Payer: Medicare Other | Admitting: Pulmonary Disease

## 2017-06-29 VITALS — BP 128/78 | HR 63 | Temp 97.8°F | Ht 70.0 in | Wt 180.2 lb

## 2017-06-29 DIAGNOSIS — M8949 Other hypertrophic osteoarthropathy, multiple sites: Secondary | ICD-10-CM

## 2017-06-29 DIAGNOSIS — M150 Primary generalized (osteo)arthritis: Secondary | ICD-10-CM | POA: Diagnosis not present

## 2017-06-29 DIAGNOSIS — Z23 Encounter for immunization: Secondary | ICD-10-CM

## 2017-06-29 DIAGNOSIS — J410 Simple chronic bronchitis: Secondary | ICD-10-CM

## 2017-06-29 DIAGNOSIS — M545 Low back pain: Secondary | ICD-10-CM | POA: Diagnosis not present

## 2017-06-29 DIAGNOSIS — I10 Essential (primary) hypertension: Secondary | ICD-10-CM

## 2017-06-29 DIAGNOSIS — C7800 Secondary malignant neoplasm of unspecified lung: Secondary | ICD-10-CM | POA: Diagnosis not present

## 2017-06-29 DIAGNOSIS — C61 Malignant neoplasm of prostate: Secondary | ICD-10-CM

## 2017-06-29 DIAGNOSIS — G8929 Other chronic pain: Secondary | ICD-10-CM | POA: Diagnosis not present

## 2017-06-29 DIAGNOSIS — M159 Polyosteoarthritis, unspecified: Secondary | ICD-10-CM

## 2017-06-29 DIAGNOSIS — I2510 Atherosclerotic heart disease of native coronary artery without angina pectoris: Secondary | ICD-10-CM | POA: Diagnosis not present

## 2017-06-29 DIAGNOSIS — E782 Mixed hyperlipidemia: Secondary | ICD-10-CM | POA: Diagnosis not present

## 2017-06-29 DIAGNOSIS — E119 Type 2 diabetes mellitus without complications: Secondary | ICD-10-CM | POA: Diagnosis not present

## 2017-06-29 DIAGNOSIS — R911 Solitary pulmonary nodule: Secondary | ICD-10-CM | POA: Diagnosis not present

## 2017-06-29 MED ORDER — AMLODIPINE BESYLATE 5 MG PO TABS: 5.0000 mg | ORAL_TABLET | Freq: Every day | ORAL | 11 refills | Status: DC

## 2017-06-29 NOTE — Patient Instructions (Signed)
Today we updated your med list in our EPIC system...     We decided to STOP your Atenolol BP med, and     Switch to the new AMLOPIDINE 5mg  tabs- one daily...    Hopefully you will notice an improvement in the dizziness with position changes...  Continue the Lisinopril and the Metformin diabetic pill...  Today we reviewed your recent blood work, and did a follow up CXR & EKG...    We will contact you w/ the results when available...   Call for any questions...  Let's plan a follow up visit in 33mo, sooner if needed for problems.Marland KitchenMarland Kitchen

## 2017-06-30 ENCOUNTER — Encounter: Payer: Self-pay | Admitting: Pulmonary Disease

## 2017-06-30 NOTE — Progress Notes (Signed)
Subjective:     Patient ID: EVEN BUDLONG, male   DOB: 03/25/1941, 76 y.o.   MRN: [id number]  HPI 76 y/o WM here for a follow up visit... he has multiple medical problems as noted below... Lamontae was [name] & [name] in the 1960s! ~  SEE PREV EPIC NOTES FOR OLDER DATA >>   ~  February 05, 2013:  66yrROV>  VJoshvareturns after a long hiatus- being cared for by DC[employer]ar [employer] Communicationsfor Cards, & DrGrapey for Urology;  He has numerous medical issues- addressed below, but returns asking for a nerve pill describing lots of stress w/ his business etc; we discussed trial [id number] as needed...     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    HBP> on Aten25, Lisin10;  BP= 142/78 & wt=199# (same as 2011); he denies CP, palpit, SOB, dizzy, edema, etc...    CAD> rec to take ASA81; followed by DrHochrein & last seen 10/13- stable, no changes made...    Dyslipidemia> on Prav20;  FLP 6/14 showed TChol 169, TG 242, HDL 31, LDL 93 & we reviewed diet, exercise, & rec adding FENOFIBRATE 16063m...     GI- GERD, Divertics> on Omep40;  He denies abd pain, dysphagia, n/v, c/d, blood seen; last colon by DrPatterson was 2004 & he is due for f/u procedure...     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 We reviewed prob list, meds, xrays and labs> see below for updates >>   LABS 6/14:  FLP- chol ok but TG=242 HDL=31;  Chems- wnl;  CBC- wnl;  TSH=1.28;  PSA=3.21 (prev in 2011 it was 0.57)...  ~  August 13, 2013:  40mo R29mo  Donavon has gained 9# up to 208# today, he blames it on quitting chewing tobacco but I told him the trade-off was worth it, asked to get on diet & incr exercise... He has no new complaints or concerns...    Breathing is stable but needs to incr exercise program...    BP is controlled w/ Aten25 & Lisin10;  BP=128/78 today & he denies CP, palpit, SOB, edema...    He saw DrHochrein 10/14- on above + ASA; stable & asymptomatic, no changes made, f/u 4yr...16yrLipids treated w/ Prav20 & diet but he's gained wt as noted; intol to Fenofib previously- we reviewed diet, exercise, wt reduction strategies...    He saw DrGrapey for prostate Ca follow up 8/14> treated w/ XRT but slowly rising PSA since then, they are on Observation protocol w/ check ups every 40mo... 19moeviewed prob list, meds, xrays and labs> see below for updates >> Given PREVNAR-13 & Rx for Shingles vaccine...   EKG 10/14 by DrHochrein showed SBrady, rate52, borderline EKG, NAD...   ADDENDUM> PSA 2/15 by DrGrapey was 4.26...   ~  February 10, 2014:  40mo ROV 11moVance indGevorks that he is doing well, no new complaints or concerns;  He has a rising  PSA- followed by DrGrapey Q67mow/ PSADT est ~184mo last measured 4.26 in [id number] He saw Urology 3/15 & note reviewed (prev hx well outlined) and plan was to continue monitoring Q6m38motil PSA ~10 then consider hormone therapy...  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    HBP> on Aten25, Lisin10;  BP= 132/82 & wt=203# (down 5# last 17mo43moe  denies CP, palpit, SOB, dizzy, edema, etc...    CAD> on ASA81; followed by DrHochrein & last seen 10/14- remains stable, no changes made, f/u 54yr.22yr  Dyslipidemia> on Prav20; hx intol to Fenofibrate; FLP 6/14 showed TChol 169, TG 242, HDL 31, LDL 93 & we reviewed diet, exercise, & wt reduction...    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 We reviewed prob list, meds, xrays and labs> see below for updates >>   CXR 7/15 shows norm heart size & Ao atherosclerosis, bilat pulm nodules are visualized (new since 2/11 CXR) w/ largest ~2cm in LUL...    LABS 7/15:  FLP- not at goals w/ TG elev & HDL low;  Chems- ok x BS=135;  CBC- wnl;  TSH=1.38;  PSA=6.15...  CT Chest/ Abd/ Pelvis 7/15 showed numerous bilat pulm nodules; no signif findings in abd/pelvis x gallstones, adrenal adenoma, scat divertics, and atherosclerotic changes; degen changes in spine PLAN>  CXR shows recurrent lung nodules (assoc w/ his rising PSA); prev noted nodules from 2007 were assoc w/ PSA of 10.6 at time of Dx, and the CXR cleared w/o nodules seen in 2008, 2009, 2011; he has been followed by Urology w/ slowly rising PSA at 3.1 last yr and 4.26 earlier this yr, and now=6.15   With f/u CXR showing recurrent mult bilat pulm nodules (also seen at time of Prostate cancer Dx in 2007 & resolved (2008, 2009, 2011) after XRT & hormone therapy at time of dx...   PET Scan 02/20/14 showed numerous bilat  noncalcif pulm nodules, 5 measured w/ max SUV 1.6-4.6, coronary atherosclerosis, no skeletal metssmall right adrenal nodule (adenma favored)...  Nuclear Bone Scan 02/27/14 showed focal areas of uptake in spine (most likely degenerative) & knee/ shoulder- no convincing evid for mets to bone...  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  PLAN>  He has upcoming appt w/ DrGrapey for Urology   ~  June 18, 2014:  126moROV & VMasayukisaw DrGrapey 9/15 who agreed w/ our Dx of an unusual case of Prostate Cancer w/ mult lung metastasis; they started Rx w/ Lupron (planning shots Q434moand used Casodex for the 1st 30d only; he has f/u appt in Jan2016 w/ Urology for PSA & Testos levels; by his account he has been tolerating the Rx well w/o new issues until last week...  He states that on 11/23 he had a "head cold & chest congestion" so he went to the local [facility] & was evaluated & given Omnicef & a steroid shot in right buttock; on 11/24 he awoke w/ pain in his right leg (?hip area & thigh area mostly) & states he "couldn't walk" due to the pain & numbness; on 11/25 he went to his Chiro w/ XRays done (later told they showed "alot of issues in my lower back"); he has an inversion table he's used on & off for yrs but hasn't been on it recently; 11/27 was Thanksgiving & on 11/28 [facility] so much [facility] XRays of Lumbar sp w/ extensive degen changes, XRay of right hip w/ min degen arthritis, VenDopplers neg for DVT, no rash presenty, sl tender right hip but good ROM> they told him likely siatica- given Pred taper & Percocet5 which he has taken ~3/d & today notes sl improved...  Still no rash present & exam is unchanged from what is described- min tender, good ROM, essent neg SLR, symmetric reflexes and strength; note- [facility]...  Daughter  requests that he see DrLucey for Ortho eval & we will try to expedite this ASAP for pt...     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     Prostate Cancer w/ lung mets> 9/15 OV by DrGrapey reviewed; pt was started on Lupron shots (planned Q4mo326mogiven Casodex x30d to start; pt has f/u visit sched for [id number] labs (PSA & Testos) and 2nd Lupron injection... We reviewed prob list, meds, xrays and labs> see below for updates >>   CXR 12/15 showed signif improvement w/ decr in size & number of nodules... PLAN>> now w/ new onset of right leg pain- primarily right thigh area w/ some burning as well; no rash present & asked to watch for vesicular eruption & call if any changes; this is the vicinity of meralgia paresthetica & we discussed trial GABAPENTIN start 100mg626m; continue Pred taper given in ER and refilled the Oxycodone5 Tid + constip prophylactic regime w/ Miralax daily & Senakot-S Qhs... Plan ROV 6wks...  ~  July 30, 2014:  6wk ROV & VanceArkeems me he went to an Ortho in [place] who did MRI and referred him to NS- DrJenkins, told him he could do surg but advised holding off since his pain level is diminished, still taking the Neurontin100Tid & Oxycodone5 prn; we do not have notes from these consultants & he is asked to get records sent to us to[place] into Epic...     VanceEyoel/o a URI w/ cough, beige sput, sinus drainage, & chest soreness from the cough; he denies f/c/s, denies incr SOB, etc; we decided to treat w/ ZPak, Hycodan, Mucinex, fluids...     He has f/u appt w/ Urology, DrGrapey soon; as noted his last CXR 12/15 showed signif improvement w/ decr  in size & number of nodules (believed to be metastatic prostate cancer)...  We reviewed prob list, meds, xrays and labs> see below for updates >>  PLAN>> we decided to treat w/ ZPak, Hycodan, Mucinex, fluids; he will f/u in 70mow/ repeat  CXR...  ~  Nov 28, 2014:  428moOV & VaDanielaeports that is breathing is good, no problems reported- denies cough, sput, SOB, CP, etc;  His CC remains LBP- pt says MRI reveqaled bulging discs & he is sched for an epid steroid shot per DrJenkins (we still do [place]. He continues to f/u w/ Urology- DrGrapey & pt indicates that he's due for lab work next week & a hormone shot after that- again he is asked to request records be sent to usKorearom his specialists for us[place]ore[place] review & so these records will be avail in EPIC...  EXAM reveals Afeb, VSS, O2sat=99% on RA;  HEENT- neg, mallampati2;  Chest- clear w/o w/r/r;  Heart- RR w/o m/r/g;  Ext- neg w/o c/c/e...   CXR 5/16 shows regression of the bilat lung nodules & now resolved, norm heart size, NAD...  PLAN>>  VaArgeniss stable from the pulmonary standpoint; he has mult specialists attending his various problems & he is reminded to get records sent to us[place]ore[place] review & scan into Epic...   ~  June 01, 2015:  15m70moV & VanBobports that he is doing well- he remains on Lupron shots per DrGrapey- last seen 04/13/15 & note reviewed- he's had a great response to the Lupron w/ PSA falling to zero & pulmonary metastatic prostate cancer nodules melting away; tolerating the shots well...  He had a ruptured bicep tendon in right arm repaired by DrKuzma 02/2015... 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    HBP> on Aten25, Lisin10;  BP= 132/84 & wt=203# (stable); he denies CP, palpit, SOB, dizzy, edema, etc...     CAD> on ASA81; followed by DrHochrein & last seen 10/15- remains stable, no changes made, overdue for yearly f/u visit w/ Cards...    Dyslipidemia> on Prav20; hx intol to Fenofibrate; FLP 11/16 showed TChol 169, TG 292, HDL 34, LDL 81 & we reviewed better diet, exercise, & wt reduction...    GI- GERD, Divertics> on Omep40;  He denies abd pain, dysphagia, n/v, c/d, blood seen; last colon by DrPatterson was 8/14 & showed mod divertics, no polyps, and he rec f/u colon for routine risk ~10y87yr Hx Prostate Cancer w/ lung mets> followed by DrGrapey w/ hx prostate ca 2007 w/ IMRT & hormone therapy x2yrs33yr has had slowly rising PSA= 3.21 [id number]= 4.26 Feb20YPP5093= 6.15 Jul2015=> Dx w/ metastatic ca to lungs; treated by DrGrapey w/ Lupron shots and PSA ret to zero w/ resolution of the pulm nodules...    DJD> on OTC analgesics as needed; he has seen DrDaldorf in the past for left knee pain...    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 EXAM  reveals Afeb, VSS, O2sat=98% on RA;  HEENT- neg, mallampati2;  Chest- clear w/o w/r/r;  Heart- RR w/o m/r/g;  Abd- soft, nontender, neg;  Ext- neg w/o c/c/e...   LABS 05/2015>  FLP- ok on Prev20 x TG=292;  Chems- ok x BS=147;  CBC- wnl;  TSH=1.41... PSAs checked by DrGrapey 7 reported to be= zero... IMP/PLAN>>  Abubakar is stable on current regimen;  Given 2016 Flu vaccine; rec to continue same and we plan ROV in 77mo..  ~  December 29, 2015:  665moOV & VaAverilleports doing well overall;  He tells me that he has several friends on Gabapentin for back pain & he wants to try it if at all poss- we discussed this med & agreed to Rx w/ 10033mhs=> incr to 200m64m tolerated;  See prob list above...     BP/ CAD well controlled on ASA, Aten25 & Lisin10; BP=134/80 & he denies HA, visual sx, CP, palpit, SOB, edema, etc...    He remains on Prav20 + FishOil and FLP 11/16 showed elev TG- advised same med, better low fat diet,  lose 10 lbs...    He has been trying to control his DM w/ diet alone- but labs today showed BS=132, A1c=7.4 & we decided to start METFORMIN 500mg57m...    He has hx of prostate Ca w/ lung mets- followed by DrGrapey & last seen ~6wks ago- on Lupron shots and pt reports that PSA remains zero!    VanceToussaintDJD, LBP/ bulging discs on Percocet10 prn, using sparingly & ave 1/wk; he tells me that he takes Aleve Bid & wants to try the Gabapentin as above... EXAM reveals Afeb, VSS, O2sat=96% on RA;  HEENT- neg, mallampati2;  Chest- clear w/o w/r/r;  Heart- RR w/o m/r/g;  Abd- soft, nontender, neg;  Ext- neg w/o c/c/e...   CXR 12/29/15 showed norm heart size, clear lungs w/o nodules, Tspine degen changes, NAD...  LABS 12/29/15>  Chems- ok x BS=132;  A1c=7.4... IMMarland KitchenMarland KitchenPLAN>>  VanceSailortable overall w/ his mult medical issues- we decided to start METFORM500 Qam & reviewed low carb diet, need for wt reduction;  We also started trial Gabapentin per his request for back issues;  We plan ROV 79mo, 9moer if needed prn...  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 Jeff JenEarle Gellhiro, told bulging discs & old XRays shHoover Brunetteextensive degen changes in lumbar spine; he had prev MRI in [place] & DrJenkins told him he could do surg but advised holding off since his pain at that time was diminished; Pt recently requested trial of Gabapentin (just like several friends) & now requests f/u appt w/ neurosurg... 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    HBP> on Aten25, Lisin10;  BP= 130/84 & wt=194# (BMI=27); he denies CP, palpit, SOB, dizzy, edema, etc...    CAD> on ASA81; followed by DrHochrein & last seen 10/15- remains stable, no changes made, & overdue for f/u visit w/ Cards...    Dyslipidemia (mixed)> on Prav20; hx intol to Fenofibrate; FLP 12/17 shows TChol 186, TG 251, HDL 40, LDL 96 & we reviewed better low carb/ low fat diet, exercise, & wt reduction...    GI- GERD, Divertics> on Omep40;  He denies abd pain, dysphagia, n/v, c/d, blood seen; last colon by DrPatterson was 8/14 & showed mod divertics, no polyps, and he rec f/u colon for routine risk ~61yr    Hx Prostate Cancer w/ lung mets> followed by DrGrapey w/ hx prostate ca 2007 w/ IMRT & hormone therapy x253yr he has had slowly rising PSA= 3.21 [id number]= 4.26 [id number]= 6.15 Jul2015=> Dx w/ metastatic ca to lungs; treated by DrGrapey w/ Lupron shots and PSA ret to zero w/ resolution of the pulm nodules...    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 EXAM reveals Afeb, VSS, O2sat=96% on RA;  HEENT- neg, mallampati2;  Chest- clear w/o w/r/r;  Heart- RR w/o m/r/g;  Abd- soft, nontender, neg;  Back- sl tender, decr  ROM, abn SLR;  Ext- neg w/o c/c/e;  Neuro- w/o deficits...  LABS 06/29/16>  FLP- not at goals w/ TG=251;  Chems- ok x BS=138, A1c=6.9 on diet alone & he doesn't want meds (asked to really get on diet etc);  CBC- wnl;  TSH= 1.41  MRI Lumbar spine => pending IMP/PLAN>>  VancHastonts to control his DM w/o meds & we reviewed the diet/ exercise/ wt reduction required!  His FLP is not at goal either & diet is the key here to reduction in the TGs;  His CC is LBP & we discussed rest, heat, Pred dosepak & refer to NS DrJJenkins (his office called & no appt for 73mo-37mo to get new MRI Lumbar & get results to them);  OK 2017 FLU vaccine today...  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   ~  December 28, 2016:  20mo R49mo Delores Matthants stable- "just my back" meaning his LBP, worse w/ activity etc; he has seen drDaldorf for Ortho, DrJenkins for NS, and a chiro; he tells me [place] we set him up to see DrJenkins Neurosurg & he wanted us to [place] MRI 1st- this was done 06/2016 w/ progressive spondylosis L2-3 w/ bulging disc & nerve root compression in the thecal sac=> I do not have recent notes from him; pt reports "shot" w/o much relief prev;  We discussed Tramadol + Tylenol Rx...  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    HBP> on  Aten25, Lisin10;  BP= 132/70 & wt=194# (BMI=27); he denies CP, palpit, SOB, dizzy, edema, etc...    CAD> on ASA81; followed by DrHochrein & last seen 10/15- remains stable, no changes made, & overdue for f/u visit w/ Cards...    Dyslipidemia (mixed)> on Prav20; hx intol to Fenofibrate; FLP 12/17 shows TChol 186, TG 251, HDL 40, LDL 96 & we reviewed better low carb/ low fat diet, exercise, & wt reduction...    GI- GERD, Divertics> on Omep40;  He denies abd pain, dysphagia, n/v, c/d, blood seen; last colon by DrPatterson was 8/14 & showed mod divertics, no polyps, and he rec f/u colon for routine risk ~24yr    Hx Prostate Cancer w/ lung mets> followed by DrGrapey w/ hx prostate ca 2007 w/ IMRT & hormone therapy x246yr he has had slowly rising PSA= 3.21 [id number]= 4.26 FeXTK2409PSA= 6.15 Jul2015=> Dx w/ metastatic ca to lungs; treated by DrGrapey w/ Lupron shots and PSA ret to zero w/ resolution of the pulm nodules (getting Lupron Q6m63mo.    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 EXAM reveals Afeb, VSS, O2sat=96% on RA;  HEENT- neg, mallampati2;  Chest- clear w/o w/r/r;  Heart- RR w/o m/r/g;  Abd- soft, nontender, neg;  Back- sl tender, decr ROM, abn SLR;  Ext- neg w/o c/c/e;  Neuro- w/o deficits...  LABS 12/28/16>  Chems- ok w/ BS=137, Cr=0.79;  HgA1c=7.1 and we discussed MetformER 500mg 34m.. IMP/PLAN>>  Shanan's CC is his back pain, under the care of NS- DrJenkins and he is awaiting f/u visit to discuss options;  We will Rx w/ Tramadol50 + tylenol;  Labs showed worsening BS/ A1c=> mild DM & rec to  start MetformER500;  He continues to f/u w/ NS & Urology;  Cardiac stable w/o CP, palpit, etc...    ~  June 29, 2017:  67mo RO71moVance rMarquices a good interval w/ CC= back & left leg pain w/ f/u neurosurg appt sched for next week...  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     He saw UROLOGY-DrGrapey 05/01/17>  F/u prostate cancer; on androgen depriv therapy since 2015, PSAs have been ~zero; he had pulm metastatic dis, neg bone scan, getting Lupron  Q39mo DEXA was ok in 2017, CXR remains clear, no mets in lumbar spine;  Labs 04/24/17 showed PSA=0.10 & Testos=12.7;  Given Lupron 490mIM...  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    HBP> on Aten25, Lisin10;  BP= 128/78 & wt=180# down 14# (BMI=25); he denies CP, palpit, SOB, edema, but has had some dizziness esp when he changes positions from supine to standing=> stop Aten25 & start Amlod5...    CAD> on ASA81; followed by DrHochrein & last seen 10/15- remains stable, no changes made, & overdue for f/u visit w/ Cards...    Dyslipidemia (mixed)> on Prav20; hx intol to Fenofibrate; FLP 12/17 shows TChol 186, TG 251, HDL 40, LDL 96 & we reviewed better low carb/ low fat diet, exercise, & wt  reduction...    DM> Labs 12/17 showed BS=138 & A1c=6.9- he wanted diet alone; recheck labs 6/18 showed BS=137 & HgA1c=7.1=> started on MetformER500; f/u labs 12/18 showed BS=115, A1c=6.3 but he c/o diizy & we changed dose to eve.    GI- GERD, Divertics> on Omep40;  He denies abd pain, dysphagia, n/v, c/d, blood seen; last colon by DrPatterson was 8/14 & showed mod divertics, no polyps, and he rec f/u colon for routine risk ~1021yr  Hx Prostate Cancer w/ lung mets> followed by DrGrapey w/ hx prostate ca 2007 w/ IMRT & hormone therapy x2yr93yre has had slowly rising PSA= 3.21 [id number]= 4.26 [id number]= 6.15 Jul2015=> Dx w/ metastatic ca to lungs; treated by DrGrapey w/ Lupron shots and PSA ret to zero w/ resolution of the pulm nodules (getting Lupron Q6mo)63mo   DJD> on OTC analgesics as needed; he has seen DrDaldorf in the past for left knee pain, DrJJenkins and Chiro for LBP w/ prev MRI in [place] (?results); he presented 12/17 w/ worsening/ persistent LBP & requesting NS referral => DrJenkins booked for 23mo &423mouested us to [place] Lumbar MRI w/ copy to him; Rx w/ rest/ heat/ Gabapentin/ Pred dosepak...    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 EXAM reveals Afeb, VSS, O2sat=95% on RA;  HEENT- neg, mallampati2;  Chest- clear w/o w/r/r;  Heart- RR w/o m/r/g;  Abd- soft, nontender, neg;  Back- sl tender, decr ROM, abn SLR;  Ext- neg w/o c/c/e;  Neuro- w/o deficits...  CXR 06/29/17 (independently reviewed by me in the PACS system) showed norm heart size, clear lungs- NAD  EKG 06/29/17>  SBrady, rate55, 1st degree AVB, otherw wnl...   LABS 06/2017>  Chems- ok w/ BS=115, Cr=0.83, A1c=6.3...  IMPMarland KitchenMarland KitchenLAN>>  We decided to stop his Aten25 & try Amlod5;  Switch the MetformER from AM to PM at dinner; ok flu shot today...          Problem List:    BRONCHITIS, RECURRENT (ICD-491.9) - no recent URI symptoms... Hx of PULMONARY NODULES (ICD-518.89) -  mult sm pulm nodules (?etiology- poss granulomas) on prev Chest CT scans... ~  CXR 2007 when coronary stent placed showed ?sm  nodules seen... ~  CT Chest 8/07 showed bilat noncalcif lung nodules, varying size in all lobes & largest= 9.16m in LLL, no adenopathy... note: PSA=10.6 ~  CT Abd&Pelvis 9/07 showed mult sm lung nodules at bases bilat, gallstones, sm duod divertic, scat atherosclerotic calcif, no mass or adenop, asymmetric enlarged prostate...  ~  CT Chest 12/07 showed mult pulm nodules bilat- no change, no adenopathy, coronary calcif...  ~  f/u CXR 8/08 without nodules seen... (note: PSA= 0.03) ~  f/u CXR 11/09 clear, no lesions seen... ~  f/u CXR 2/11 showed clear, NAD..Marland Kitchen (note: PSA= 0.57) ~  CXR 7/15 shows norm heart size & Ao atherosclerosis, bilat pulm nodules are visualized (new since 2/11 CXR) w/ largest ~2cm in LUL... 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 ~  CXR 12/15 (after Lupron shot & 119mof Casodex) showed signif improvement w/ decr in size & number of nodules ~  1/16: he returned w/ URI- cough, sput, chest soreness and we decided to treat w/ ZPak, Hycodan, Mucinex, fluids... ~  CXR 5/16 shows resolution of the prev metastatic nodules, heart size is wnl, NAD...Marland Kitchen ~  CXR 6/17 showed norm heart size, clear lungs w/o nodules, Tspine degen changes, NAD...  HYPERTENSION (ICD-401.9) - contolled on ATENOLOL 2513m, & LISINOPRIL 66m63m..  ~  9/11:  BP=110/62 here and usually 120s/ 70s at home- denies HA, fatigue, visual changes, CP, palipit, dizziness, syncope, dyspnea, edema, etc... ~  7/14:  on Aten25, Lisin10;  BP= 142/78 & wt=199# (same as 2011); he denies CP, palpit, SOB, dizzy, edema, etc. ~  7/15: on Aten25, Lisin10;  BP= 132/82 & wt=203# (down 5# last 7mo)57mo remains asymptomatic... ~  12/15: on Aten25, Lisin10; BP= 140/90 & wt=202#; he denies CP, palpit, SOB,  edema... ~  5/16: on Aten25, Lisin10; BP= 140/90 & he is asymptomatic... Reminded to elim sodium. ~  6/17-12/17:  well controlled on ASA, Aten25 & Lisin10; BP=130/80 range & he denies HA, visual sx, CP, palpit, SOB, edema, etc;...  CAD (ICD-414.00) - followed by DrHochrein... on ASA 325mg/166m  NuclearStressTest 8/07 abnormal w/ ant ischemia... ~  cath 8/07 showed 95% mid-LAD stenosis, & 20% lesions in the other 2 vessels, EF=60%... subseq PTCA w/ non-drug eluting stent... ~  OV w/ Cards= 5/10 & note reviewed- BP sl elevated & Lisinopril added. ~  He saw DrHochrein 10/13> doing well, no new symptoms, exam was neg, no change in meds & rec aggressive risk factor reduction strategy...  ~  EKG 10/13 showed NSR, rate62, LAD, otherw wnl, NAD... ~  He saw DrHochrein 10/14- on above + ASA; stable & asymptomatic, no changes made, f/u 63yr ~ 56yrsaw DrHochrein 10/15> HBP, CAD, HL- stable w/o CP/ palpit/ SOB/ edema/ etc (despite being diagnosed w/ met prostate ca to lungs)...  DYSLIPIDEMIA (ICD-272.4) - on PRAVACHOL 20mg/d 14mtol [place]...  ~  FLP 8/08[place] TChol 168, TG 143, HDL34, LDL105 ~  FLP 2/09 showed TChol 163, TG 207, HDL 34, LDL 91 ~  FLP 5/10 showed TChol 148, TG 131, HDL 33, LDL 89 ~  FLP 9/11 showed TChol 163, TG 140, HDL 42, LDL 93 ~  FLP 6/14 on Prav20 showed TChol 169, TG 242, HDL 31, LDL 93  ~  FLP 7/15 on Prav20 showed TChol 174, TG 290, HDL 31, LDL 87... We reviewed low fat diet &  need for wt reduction. ~  FLP 11/16 on Prav20 showed TChol 169, TG 292, HDL 34, LDL 81... Ditto ~  [place] 12/17 on Prav20 showed TChol 186, TG 251, HDL 40, LDL 96... Needs better low carb/ low fat/ wt reducing diet.  IFG=> Diabetes Mellitus> ~  Hx BS betw 130-150 on diet alone... ~  6/17:  Labs showed BS=132, A1c=7.4 and we decided to start METFORMIN 530m Qam => pt took it for 117mo stopped on his own for dizziness. ~  12/17:  Labs showed BS=138, A1c=6.9; he wants to control on diet alone- we reviewed low  carb/ low fat, wt redcing diet...  GERD (ICD-530.81) - prev on OMEP20 but causes diarrhea he says, and ACIPHEX 2066m works better...  ~  last EGD (DrPatterson)was 8/04 showing GERD & stricture dilated... ~  5/11: presented to GI w/ recurrent dysphagia- EGD showed 3cmHH, stricture dilated, no Barrett's, changed to Aciphex. ~  On Omep40 & he remains asymptomatic w/o dysphagia, CP, abd pain, n/v, c/d, blood seen...  DIVERTICULOSIS OF COLON (ICD-562.10) >>  ~  Colonoscopy 8/04 by DrPatterson was normal, f/u 10y30yrcattered tics seen on 1996 flex) ~  7/14:  He is due for f/u colon 7 we will refer the chart to DrPatterson for review... ~  Colonoscopy 8/14 by DrPatterson showed mod divertics, no polyps, and he rec f/u colon for routine risk ~63yr31yr PROSTATE CANCER (ICD-185) - followed by DrPeterson- elevated PSA found 8/07 (10.6)... biopsies were pos (Stage T2b, bilat dis w/ Gleason4+3=7 on one side & 8 on the other) & there is a family hx as well; he had bilat pulm nodules on CXR & CT Chest at diagnosis... [facility], then hormonal Rx for 2 y[facility]... [facility]... prev on TRELSTAR injections q 3mont47monthOVERA for the hot flashes>> x2yrs; 77yr bone scan 1/08;  PSA here= 0.03 in 2008, and CXR cleared w/o nodules seen in 2008, 2009, 2011 (see above)... ~  2/11: we don't have recent note from DrPeterSteilacoomill request info sent to us at [place] visit. ~  9/11: labs here showed PSA= 0.57 & we will forward to DrPeterPosen3:  He had f/u visit w/ DrGrapey> PSA had been slowly rising and ~1 when they last checked; DrGrapey rec Q6mo f/u68mo 7/14:  Pt missed his 3/14 appt w/ Urology & is sched to see them 8/14; PSA recorded 6/14 = 3.21 & we sent copy to DrGrapey... ~  3/15:  He had f/u DrGrapey> prostate Ca dx 2007, [facility]) & 93yr in 2008; slowly rising PSA w/ doubling time ~38mo now;58morapey follows pt Q6mo & plan79mofor hormone therapy when PSA>10... ~  7/15:  Routine CXR showed return of bilat pulm nodules assoc w/ PSA= 6.15  (CT Chest, Abd, Pelvis is planned & discussion w/ DrGrapey & Oncology)=> Dx w/ metastatic prostate adenocarcinoma (lung mets) ~  9/15: he was started on Lupron shots Q4mo, given 54modex daily x30d per DrGrapey; f/u OV planned Jan2016... ~[id number]: CXR showed improvement w/ decr in size & number of lung nodules... He has a follow up appt w/ DrGrapey in Jan2016. ~  He remains under the care of DrGrapey for Urology Q6mo- on LUPR7mohots, c/o some hot flashes, Labs show PSA<0.04 & Testos low at 32  DEGENERATIVE JOINT DISEASE (ICD-715.90) >>  RIGHT THIGH AREA  PAIN 12/15 >> ?etiology, he was [facility], by Chiropractor, & by [facility]... ~  his left knee gives him some pain on and off; he saw DrDaldorf for this 12/08 & he thought there might be a torn meniscus, they decided to Rx w/ NAPROSEN which helps; may yet need MRI/ arthroscopy... ~  2/11: discussed trial of Mobic to see if this works for him. ~  12/15: presented w/ right thigh area pain ?etiology after eval at [facility], by [employer], by [employer]; XRays w/ extensive degen changes in lumbar spine; sl improved w/ Pred & Percocet from [place]; no rash seen, ?could this be meralgia paresthetica? They want to see DrLucey for Ortho eval, we will give trial GABAPENTIN100Tid, watch for rash (he had Shingles vaccine 2 mo ago)... ~  1/16: Gates tells me he went to an Ortho in [place] who did MRI and referred him to NS- DrJenkins, told him he could do surg but advised holding off since his pain level is diminished, still taking the Neurontin100Tid & Oxycodone5 prn; we do not have notes from these consultants & he is asked to get records sent to [place] to scan into Epic. ~  6/17:  Durene Fruits reports that he has several frinds on Gabapentin w/ relief of back pain & he requests a trial- OK start  w/ 1251m Qhs & incr to 2029mif tol... ~  12/17:  Pt c/o LBP & tried Neurontin w/o relief, Ortho in [place], prev chiro w/o relief => requests referral to NS, DrJJenkins but no openings for months so we will check Lumbar MRI 7 rx w/ rest, heat, Pred dosepak...  HEALTH MAINTENANCE >>  ~  GI>  [facility]- f/u due now... ~  GU>  Followed by DrGrapey & seen every 51m48mor PSA recheck but he missed the 3/14 appt & our PSA 6/14= 3.21 (copy to Urology & he has appt 8/14)... ~  Immuniz>  He gets the yearly flu vax (last 10/13);  Had Pneumovax several yrs ago;  He received the PREVNAR-13 vax 1/15; Not sure of last Tetanus vaccine; Asking about the shingles shot=> Rx [facility]...   Past Surgical History:  Procedure Laterality Date  . DISTAL BICEPS TENDON REPAIR Right 03/12/2015   Procedure: REPAIR/RECONSTRUCTION RIGHT BICEPS TENDON;  Surgeon: GarDaryll BrodD;  [facility];  Laterality: Right;  . eyelid surgery for Lid Lag    . FOOT SURGERY    . HYDROCELE EXCISION / REPAIR    . INGUINAL HERNIA REPAIR     [age]s old  . lung nodules    . MASTECTOMY     benign tumor  . Prostate treatment     cancer  . SKIN CANCER EXCISION  2012   skin cancer on right ear    Outpatient Encounter Medications as of 06/29/2017  Medication Sig  . aspirin 81 MG tablet Take 81 mg by mouth daily.  . aMarland Kitchenenolol (TENORMIN) 25 MG tablet TAKE 1 TABLET BY MOUTH EVERY DAY  . b complex vitamins tablet Take 1 tablet by mouth daily.  . Calcium-Magnesium-Vitamin D (CALCIUM 1200+D3 PO) Take 1 tablet by mouth daily.  . gMarland Kitchenbapentin (NEURONTIN) 100 MG capsule TAKE 1 CAPSULE BY MOUTH EVERY NIGHT AT BEDTIME FOR 3 WEEKS, THEN 2 CAPSULES BY MOUTH EVERY NIGHT AT BEDTIME THEREAFTER  . GLUCOSAMINE SULFATE PO Take 1 tablet by mouth 2 (two) times daily.  . lMarland Kitchensinopril (  PRINIVIL,ZESTRIL) 10 MG tablet TAKE 1 TABLET BY MOUTH EVERY DAY   . LORazepam (ATIVAN) 1 MG tablet TAKE ONE-HALF TO 1 TABLET BY MOUTH THREE TIMES DAILY AS NEEDED FOR ANXIETY  . Multiple Vitamins-Minerals (MULTIVITAMIN PO) Take 1 tablet by mouth daily.  . Omega-3 Fatty Acids (FISH OIL) 1000 MG CAPS Take 1,000 mg by mouth daily.  Marland Kitchen omeprazole (PRILOSEC) 40 MG capsule TAKE ONE CAPSULE BY MOUTH EVERY DAY  . pravastatin (PRAVACHOL) 20 MG tablet Take 1 tablet (20 mg total) by mouth daily.  . traMADol (ULTRAM) 50 MG tablet Take 1 tablet (50 mg total) by mouth 3 (three) times daily as needed.  . [DISCONTINUED] atenolol (TENORMIN) 25 MG tablet TAKE 1 TABLET BY MOUTH EVERY DAY  . [DISCONTINUED] gabapentin (NEURONTIN) 100 MG capsule TAKE 1 CAPSULE BY MOUTH EVERY NIGHT AT BEDTIME FOR 3 WEEKS, THEN 2 CAPSULES BY MOUTH EVERY NIGHT AT BEDTIME THEREAFTER  . [DISCONTINUED] pravastatin (PRAVACHOL) 20 MG tablet TAKE 1 TABLET BY MOUTH EVERY DAY  . amLODipine (NORVASC) 5 MG tablet Take 1 tablet (5 mg total) by mouth daily.  . Cyanocobalamin (VITAMIN B-12 PO) Take 1 tablet by mouth daily.  . meloxicam (MOBIC) 15 MG tablet Take 15 mg by mouth daily.  . metFORMIN (GLUCOPHAGE XR) 500 MG 24 hr tablet Take 1 tablet (500 mg total) by mouth daily with breakfast. (Patient not taking: Reported on 06/29/2017)  . naproxen sodium (ANAPROX) 220 MG tablet Take 220 mg by mouth 2 (two) times daily with a meal.   No facility-administered encounter medications on file as of 06/29/2017.     Allergies  Allergen Reactions  . Simvastatin Other (See Comments)     pt states INTOL to ZOCOR w/ leg pains    Immunization History  Administered Date(s) Administered  . Influenza Split 05/08/2012, 06/03/2013  . Influenza Whole 08/19/2009  . Influenza, High Dose Seasonal PF 06/29/2016, 06/29/2017  . Influenza,inj,Quad PF,6+ Mos 04/18/2014, 06/01/2015  . Pneumococcal Conjugate-13 08/13/2013  . Tdap 02/10/2014    Current Medications, Allergies, Past Medical History, Past Surgical History, Family  History, and Social History were reviewed in [employer] record.   Review of Systems         See HPI - The patient denies anorexia, fever, weight loss, weight gain, vision loss, decreased hearing, hoarseness, chest pain, syncope, dyspnea on exertion, peripheral edema, prolonged cough, headaches, hemoptysis, abdominal pain, melena, hematochezia, severe indigestion/heartburn, hematuria, incontinence, muscle weakness, suspicious skin lesions, transient blindness, difficulty walking, depression, unusual weight change, abnormal bleeding, enlarged lymph nodes, and angioedema.     Objective:   Physical Exam    WD, WN, 76 y/o WM in NAD... GENERAL:  Alert & oriented; pleasant & cooperative... HEENT:  [place]/AT, EOM-wnl, PERRLA, EACs-clear, TMs-wnl, NOSE-clear, THROAT-clear & wnl. NECK:  Supple w/ fairROM; no JVD; normal carotid impulses w/o bruits; no thyromegaly or nodules palpated; no lymphadenopathy. CHEST:  Clear to P & A; without wheezes/ rales/ or rhonchi heard... HEART:  Regular Rhythm; without murmurs/ rubs/ or gallops detected... ABDOMEN:  Soft & nontender; normal bowel sounds; no organomegaly or masses palpated... EXT: without deformities, mild arthritic changes; no varicose veins/ venous insuffic/ or edema.     c/o pain in low back, some SLR difficulty, reflexes symmetric, no rash apparent... NEURO:  CN's intact; motor testing normal; sensory testing normal; gait normal & balance OK. DERM:  No lesions noted; no rash etc...  RADIOLOGY DATA:  Reviewed in the EPIC EMR & discussed w/ the patient...  LABORATORY  DATA:  Reviewed in the EPIC EMR & discussed w/ the patient...   Assessment:      12/28/16>   Iam's CC is his back pain, under the care of NS- DrJenkins and he is awaiting f/u visit to discuss options;  We will Rx w/ Tramadol50 + tylenol;  Labs showed worsening BS/ A1c=> mild DM & rec to start MetformER500;  He continues to f/u w/ NS & Urology;  Cardiac stable  w/o CP, palpit, etc.   Hx Bronchitis, and Mult Pulm Nodules>  His breathing has been fine, at baseline w/o new complaints etc;  Previous mult pulm nodules from 2007 were likely prostate ca mets that resolved w/ his treatment IMRT 2008 and hormone therapy x76yr;  CXRs in 2008, 2009, & 2011 were clear;  CXR 7/15 showed return of bilat pulm nodules (assoc w/ rising PSA=6.15) & subseq CT Chest, PET scan, Bone scan, Needle bx lung lesion confirmed Dx metastatic prostate cancer in lung. 9/15> DrGrapey started treatment w/ Lupron Q456moCasodex x30d at outset... 12/15> CXR already shows signs of improvement w/ decr size & number of lesions... 5/16> CXR nodules have resolved w/ ongoing treatment for his prostate cancer... 6/17>  He remains on Lupron from DrGrapey w/ PSA reported at zero & CXR remains clear  HBP>  Stable on low dose Aten & Lisin, continue same...  CAD>  Followed by DrHochrein; seen last 10/14 & he remains stable, no changes made; needs to incr his exercise program...  Mixed Dyslipidemia>  On Prav20 but TG & HDL deranged; needs better diet, exercise, & wt reduction...  GI- GERD, Diverics, needs f/u colon>  F/u colon 8/14 was neg x divertics, no polyps, f/u rec for 1042yr.  Prostate Ca>  Managed by DrGrapey as noted; rising PSA indicates recurrent dis and mult lung nodules represent metastatic dis... ~  Now improved on Lupron shots w/ PSA <0.04 & Testos ~32...  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 Right leg pain ?etiology> we treated empirically w/ Neurontin & prn oxycod; he went to see Ortho in [place], MRI done, referred to NS- DrJJenkins & we will call for his notes...  Anxiety>  He is requesting Rx- try ALPRAZOLAM 0.5mg62md as needed...      Plan:       Medication List        Accurate as of 06/29/17 11:59 PM. Always use your most recent med list.          amLODipine 5 MG tablet Commonly known as:  NORVASC Take 1 tablet (5 mg total) by mouth  daily.   aspirin 81 MG tablet   atenolol 25 MG tablet Commonly known as:  TENORMIN TAKE 1 TABLET BY MOUTH EVERY DAY   b complex vitamins tablet   CALCIUM 1200+D3 PO   Fish Oil 1000 MG Caps   gabapentin 100 MG capsule Commonly known as:  NEURONTIN TAKE 1 CAPSULE BY MOUTH EVERY NIGHT AT BEDTIME FOR 3 WEEKS, THEN 2 CAPSULES BY MOUTH EVERY NIGHT AT BEDTIME THEREAFTER   GLUCOSAMINE SULFATE PO   lisinopril 10 MG tablet Commonly known as:  PRINIVIL,ZESTRIL TAKE 1 TABLET BY MOUTH EVERY DAY   LORazepam 1 MG tablet Commonly known as:  ATIVAN TAKE ONE-HALF TO 1 TABLET BY MOUTH THREE TIMES DAILY AS NEEDED FOR ANXIETY   meloxicam 15 MG tablet Commonly known as:  MOBIC   metFORMIN 500 MG 24 hr tablet Commonly known as:  GLUCOPHAGE XR Take 1 tablet (500 mg total)  by mouth daily with breakfast.   MULTIVITAMIN PO   naproxen sodium 220 MG tablet Commonly known as:  ALEVE   omeprazole 40 MG capsule Commonly known as:  PRILOSEC TAKE ONE CAPSULE BY MOUTH EVERY DAY   pravastatin 20 MG tablet Commonly known as:  PRAVACHOL Take 1 tablet (20 mg total) by mouth daily.   traMADol 50 MG tablet Commonly known as:  ULTRAM Take 1 tablet (50 mg total) by mouth 3 (three) times daily as needed.   VITAMIN B-12 PO       Where to Get Your Medications    These medications were sent to [facility], [place] - [address] AT [address]  [address], [place] [address]   Phone:  [phone]   amLODipine 5 MG tablet

## 2017-07-03 ENCOUNTER — Other Ambulatory Visit: Payer: Self-pay | Admitting: Pulmonary Disease

## 2017-07-04 DIAGNOSIS — M545 Low back pain: Secondary | ICD-10-CM | POA: Diagnosis not present

## 2017-07-04 DIAGNOSIS — Z6825 Body mass index (BMI) 25.0-25.9, adult: Secondary | ICD-10-CM | POA: Diagnosis not present

## 2017-07-04 DIAGNOSIS — I10 Essential (primary) hypertension: Secondary | ICD-10-CM | POA: Diagnosis not present

## 2017-07-25 DIAGNOSIS — M47816 Spondylosis without myelopathy or radiculopathy, lumbar region: Secondary | ICD-10-CM | POA: Diagnosis not present

## 2017-07-25 DIAGNOSIS — M48062 Spinal stenosis, lumbar region with neurogenic claudication: Secondary | ICD-10-CM | POA: Diagnosis not present

## 2017-07-25 DIAGNOSIS — M5416 Radiculopathy, lumbar region: Secondary | ICD-10-CM | POA: Diagnosis not present

## 2017-07-25 DIAGNOSIS — E119 Type 2 diabetes mellitus without complications: Secondary | ICD-10-CM | POA: Diagnosis not present

## 2017-07-29 ENCOUNTER — Other Ambulatory Visit: Payer: Self-pay | Admitting: Pulmonary Disease

## 2017-08-08 DIAGNOSIS — M48062 Spinal stenosis, lumbar region with neurogenic claudication: Secondary | ICD-10-CM | POA: Diagnosis not present

## 2017-08-08 DIAGNOSIS — M5416 Radiculopathy, lumbar region: Secondary | ICD-10-CM | POA: Diagnosis not present

## 2017-08-09 DIAGNOSIS — H2512 Age-related nuclear cataract, left eye: Secondary | ICD-10-CM | POA: Diagnosis not present

## 2017-08-09 DIAGNOSIS — H25812 Combined forms of age-related cataract, left eye: Secondary | ICD-10-CM | POA: Diagnosis not present

## 2017-08-16 DIAGNOSIS — H25811 Combined forms of age-related cataract, right eye: Secondary | ICD-10-CM | POA: Diagnosis not present

## 2017-08-26 ENCOUNTER — Other Ambulatory Visit: Payer: Self-pay | Admitting: Pulmonary Disease

## 2017-08-29 NOTE — Telephone Encounter (Signed)
SN please advise on refill.  Thanks.

## 2017-08-30 ENCOUNTER — Other Ambulatory Visit: Payer: Self-pay | Admitting: Pulmonary Disease

## 2017-08-31 ENCOUNTER — Telehealth: Payer: Self-pay | Admitting: Pulmonary Disease

## 2017-08-31 NOTE — Telephone Encounter (Signed)
lmtcb x1 for pt. 

## 2017-09-01 MED ORDER — TRAMADOL HCL 50 MG PO TABS: 50.0000 mg | ORAL_TABLET | Freq: Three times a day (TID) | ORAL | 0 refills | Status: DC | PRN

## 2017-09-01 NOTE — Telephone Encounter (Signed)
JN is on vacation until 09/05/17. Made pt aware of this information. Pt states he has been without Tramadol for 1 week.   MW please advise if okay to refill, as SN is not available.   06/29/17 AVS: Today we updated your med list in our EPIC system...     We decided to STOP your Atenolol BP med, and     Switch to the new AMLOPIDINE 5mg  tabs- one daily...    Hopefully you will notice an improvement in the dizziness with position changes...  Continue the Lisinopril and the Metformin diabetic pill...  Today we reviewed your recent blood work, and did a follow up CXR & EKG...    We will contact you w/ the results when available...   Call for any questions...  Let's plan a follow up visit in 55mo, sooner if needed for problems.Marland KitchenMarland Kitchen

## 2017-09-01 NOTE — Telephone Encounter (Signed)
Ok x one refill  

## 2017-09-01 NOTE — Telephone Encounter (Signed)
Left detailed message for patient that MW has ok'ed a one time refill and that I would call in the medication to [facility] in [place].

## 2017-09-01 NOTE — Telephone Encounter (Signed)
Pt is returning call. Cb is [phone].

## 2017-09-01 NOTE — Telephone Encounter (Signed)
Pt is requesting refill on Tramadol 50mg . Last refilled 07/31/17 #90- take 1 tablet tid prn. Last ov 06/29/18 with pending apt for 12/28/17. Preferred pharmacy is [facility] on [address]. Pt also wanted to make JN aware that he had an recent xray that showed hip deterioration. Pt states he will be having hip replacement 09/2017  SN please advise on refill. Thanks  Current Outpatient Medications on File Prior to Visit  Medication Sig Dispense Refill  . amLODipine (NORVASC) 5 MG tablet Take 1 tablet (5 mg total) by mouth daily. 30 tablet 11  . aspirin 81 MG tablet Take 81 mg by mouth daily.    Marland Kitchen atenolol (TENORMIN) 25 MG tablet TAKE 1 TABLET BY MOUTH EVERY DAY 30 tablet 3  . b complex vitamins tablet Take 1 tablet by mouth daily.    . Calcium-Magnesium-Vitamin D (CALCIUM 1200+D3 PO) Take 1 tablet by mouth daily.    . Cyanocobalamin (VITAMIN B-12 PO) Take 1 tablet by mouth daily.    Marland Kitchen gabapentin (NEURONTIN) 100 MG capsule TAKE 1 CAPSULE BY MOUTH EVERY NIGHT AT BEDTIME FOR 3 WEEKS, THEN 2 CAPSULES BY MOUTH EVERY NIGHT AT BEDTIME THEREAFTER 60 capsule 6  . GLUCOSAMINE SULFATE PO Take 1 tablet by mouth 2 (two) times daily.    Marland Kitchen lisinopril (PRINIVIL,ZESTRIL) 10 MG tablet TAKE 1 TABLET BY MOUTH EVERY DAY 90 tablet 3  . LORazepam (ATIVAN) 1 MG tablet TAKE ONE-HALF TO 1 TABLET BY MOUTH THREE TIMES DAILY AS NEEDED FOR ANXIETY 90 tablet 5  . meloxicam (MOBIC) 15 MG tablet Take 15 mg by mouth daily.    . metFORMIN (GLUCOPHAGE XR) 500 MG 24 hr tablet Take 1 tablet (500 mg total) by mouth daily with breakfast. (Patient not taking: Reported on 06/29/2017) 30 tablet 11  . Multiple Vitamins-Minerals (MULTIVITAMIN PO) Take 1 tablet by mouth daily.    . naproxen sodium (ANAPROX) 220 MG tablet Take 220 mg by mouth 2 (two) times daily with a meal.    . Omega-3 Fatty Acids (FISH OIL) 1000 MG CAPS Take 1,000 mg by mouth daily.    Marland Kitchen omeprazole (PRILOSEC) 40 MG capsule TAKE ONE CAPSULE BY MOUTH EVERY DAY 90 capsule 3  .  pravastatin (PRAVACHOL) 20 MG tablet Take 1 tablet (20 mg total) by mouth daily. 30 tablet 11  . traMADol (ULTRAM) 50 MG tablet TAKE 1 TABLET BY MOUTH THREE TIMES DAILY AS NEEDED 90 tablet 0   No current facility-administered medications on file prior to visit.     Allergies  Allergen Reactions  . Simvastatin Other (See Comments)     pt states INTOL to [facility] w/ leg pains

## 2017-09-15 DIAGNOSIS — M1612 Unilateral primary osteoarthritis, left hip: Secondary | ICD-10-CM | POA: Diagnosis not present

## 2017-09-18 ENCOUNTER — Ambulatory Visit: Payer: Medicare Other | Admitting: Pulmonary Disease

## 2017-09-18 ENCOUNTER — Other Ambulatory Visit: Payer: Self-pay | Admitting: Pulmonary Disease

## 2017-09-18 VITALS — BP 128/82 | HR 72 | Temp 98.2°F | Ht 70.0 in | Wt 182.0 lb

## 2017-09-18 DIAGNOSIS — F411 Generalized anxiety disorder: Secondary | ICD-10-CM

## 2017-09-18 DIAGNOSIS — M150 Primary generalized (osteo)arthritis: Secondary | ICD-10-CM

## 2017-09-18 DIAGNOSIS — E119 Type 2 diabetes mellitus without complications: Secondary | ICD-10-CM

## 2017-09-18 DIAGNOSIS — G8929 Other chronic pain: Secondary | ICD-10-CM

## 2017-09-18 DIAGNOSIS — I2510 Atherosclerotic heart disease of native coronary artery without angina pectoris: Secondary | ICD-10-CM | POA: Diagnosis not present

## 2017-09-18 DIAGNOSIS — M545 Low back pain, unspecified: Secondary | ICD-10-CM

## 2017-09-18 DIAGNOSIS — E782 Mixed hyperlipidemia: Secondary | ICD-10-CM | POA: Diagnosis not present

## 2017-09-18 DIAGNOSIS — K5730 Diverticulosis of large intestine without perforation or abscess without bleeding: Secondary | ICD-10-CM

## 2017-09-18 DIAGNOSIS — I10 Essential (primary) hypertension: Secondary | ICD-10-CM

## 2017-09-18 DIAGNOSIS — M8949 Other hypertrophic osteoarthropathy, multiple sites: Secondary | ICD-10-CM

## 2017-09-18 DIAGNOSIS — C61 Malignant neoplasm of prostate: Secondary | ICD-10-CM

## 2017-09-18 DIAGNOSIS — M159 Polyosteoarthritis, unspecified: Secondary | ICD-10-CM

## 2017-09-18 NOTE — Patient Instructions (Signed)
Today we updated your med list in our EPIC system...    Continue your current medications the same...  Please return to our lab one morning this week for your FASTING blood work...    We will contact you w/ the results when available...   I will contact DrAlusio with the [facility] & send copies of you medical information...    Good luck w/ the left hi[p surgery...  Call for any questions...  Let's plan a follow up visit in 13mo, sooner if needed for problems.Marland KitchenMarland Kitchen

## 2017-09-19 ENCOUNTER — Other Ambulatory Visit (INDEPENDENT_AMBULATORY_CARE_PROVIDER_SITE_OTHER): Payer: Medicare Other

## 2017-09-19 ENCOUNTER — Encounter: Payer: Self-pay | Admitting: Pulmonary Disease

## 2017-09-19 DIAGNOSIS — E119 Type 2 diabetes mellitus without complications: Secondary | ICD-10-CM

## 2017-09-19 DIAGNOSIS — F411 Generalized anxiety disorder: Secondary | ICD-10-CM

## 2017-09-19 DIAGNOSIS — I10 Essential (primary) hypertension: Secondary | ICD-10-CM

## 2017-09-19 DIAGNOSIS — C61 Malignant neoplasm of prostate: Secondary | ICD-10-CM | POA: Diagnosis not present

## 2017-09-19 DIAGNOSIS — I2510 Atherosclerotic heart disease of native coronary artery without angina pectoris: Secondary | ICD-10-CM | POA: Diagnosis not present

## 2017-09-19 DIAGNOSIS — E782 Mixed hyperlipidemia: Secondary | ICD-10-CM | POA: Diagnosis not present

## 2017-09-19 LAB — CBC WITH DIFFERENTIAL/PLATELET
BASOS PCT: 0.9 % (ref 0.0–3.0)
Basophils Absolute: 0.1 10*3/uL (ref 0.0–0.1)
EOS PCT: 3.8 % (ref 0.0–5.0)
Eosinophils Absolute: 0.3 10*3/uL (ref 0.0–0.7)
HEMATOCRIT: 40.2 % (ref 39.0–52.0)
HEMOGLOBIN: 13.7 g/dL (ref 13.0–17.0)
LYMPHS PCT: 46.5 % — AB (ref 12.0–46.0)
Lymphs Abs: 3.2 10*3/uL (ref 0.7–4.0)
MCHC: 34.1 g/dL (ref 30.0–36.0)
MCV: 93.6 fl (ref 78.0–100.0)
MONO ABS: 0.6 10*3/uL (ref 0.1–1.0)
Monocytes Relative: 8.9 % (ref 3.0–12.0)
Neutro Abs: 2.8 10*3/uL (ref 1.4–7.7)
Neutrophils Relative %: 39.9 % — ABNORMAL LOW (ref 43.0–77.0)
Platelets: 193 10*3/uL (ref 150.0–400.0)
RBC: 4.3 Mil/uL (ref 4.22–5.81)
RDW: 13.5 % (ref 11.5–15.5)
WBC: 7 10*3/uL (ref 4.0–10.5)

## 2017-09-19 LAB — TSH: TSH: 2.09 u[IU]/mL (ref 0.35–4.50)

## 2017-09-19 LAB — PSA: PSA: 0.13 ng/mL (ref 0.10–4.00)

## 2017-09-19 LAB — COMPREHENSIVE METABOLIC PANEL
ALT: 17 U/L (ref 0–53)
AST: 16 U/L (ref 0–37)
Albumin: 4.1 g/dL (ref 3.5–5.2)
Alkaline Phosphatase: 80 U/L (ref 39–117)
BUN: 23 mg/dL (ref 6–23)
CALCIUM: 9.9 mg/dL (ref 8.4–10.5)
CHLORIDE: 105 meq/L (ref 96–112)
CO2: 28 mEq/L (ref 19–32)
CREATININE: 0.8 mg/dL (ref 0.40–1.50)
GFR: 99.88 mL/min (ref >60.00)
Glucose, Bld: 131 mg/dL — ABNORMAL HIGH (ref 70–99)
POTASSIUM: 5.2 meq/L — AB (ref 3.5–5.1)
Sodium: 140 mEq/L (ref 135–145)
Total Bilirubin: 0.5 mg/dL (ref 0.2–1.2)
Total Protein: 6.4 g/dL (ref 6.0–8.3)

## 2017-09-19 LAB — LIPID PANEL
CHOL/HDL RATIO: 4
Cholesterol: 171 mg/dL (ref 0–200)
HDL: 40.2 mg/dL (ref >39.00)
LDL CALC: 93 mg/dL (ref 0–99)
NonHDL: 130.99
TRIGLYCERIDES: 189 mg/dL — AB (ref 0.0–149.0)
VLDL: 37.8 mg/dL (ref 0.0–40.0)

## 2017-09-19 LAB — HEMOGLOBIN A1C: Hgb A1c MFr Bld: 6.6 % — ABNORMAL HIGH (ref 4.6–6.5)

## 2017-09-19 NOTE — Progress Notes (Addendum)
Subjective:     Patient ID: [name], male   DOB: 12-02-1940, 77 y.o.   MRN: [id number]  HPI 77 y/o WM here for a follow up visit... he has multiple medical problems as noted below... [name] was [name] & [name] in the 1960s! ~  SEE PREV EPIC NOTES FOR OLDER DATA >>   ~  February 05, 2013:  14yrROV>  VChristiferreturns after a long hiatus- being cared for by DC[employer]ar [employer] Communicationsfor Cards, & DrGrapey for Urology;  He has numerous medical issues- addressed below, but returns asking for a nerve pill describing lots of stress w/ his business etc; we discussed trial Alpraz0.'5mg'$  tid as needed...     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    HBP> on Aten25, Lisin10;  BP= 142/78 & wt=199# (same as 2011); he denies CP, palpit, SOB, dizzy, edema, etc...    CAD> rec to take ASA81; followed by DrHochrein & last seen 10/13- stable, no changes made...    Dyslipidemia> on Prav20;  FLP 6/14 showed TChol 169, TG 242, HDL 31, LDL 93 & we reviewed diet, exercise, & rec adding FENOFIBRATE '160mg'$ /d...     GI- GERD, Divertics> on Omep40;  He denies abd pain, dysphagia, n/v, c/d, blood seen; last colon by DrPatterson was 2004 & he is due for f/u procedure...     Hx Prostate Cancer> followed by DrGrapey but pt missed his last appt & is due now- Hx prostate ca 2007 w/ IMRT & hormone therapy x278yr slowly rising PSA per Urology but Lab 6/14 showed PSA= 3.21    DJD> on OTC analgesics as needed; he has seen DrDaldorf in the past for left knee pain...    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 We reviewed prob list, meds, xrays and labs> see below for updates >>   LABS 6/14:  FLP- chol ok but TG=242 HDL=31;  Chems- wnl;  CBC- wnl;  TSH=1.28;  PSA=3.21 (prev in 2011 it was 0.57)...  ~  August 13, 2013:  31m8moV &  [name] has gained 9# up to 208# today, he blames it on quitting chewing tobacco but I told him the trade-off was worth it, asked to get on diet & incr exercise... He has no new complaints or concerns...    Breathing is stable but needs to incr exercise program...    BP is controlled w/ Aten25 & Lisin10;  BP=128/78 today & he denies CP, palpit, SOB, edema...    He saw DrHochrein 10/14- on above + ASA; stable & asymptomatic, no changes made, f/u 40yr66yr   Lipids treated w/ Prav20 & diet but he's gained wt as noted; intol to Fenofib previously- we reviewed diet, exercise, wt reduction strategies...    He saw DrGrapey for prostate Ca follow up 8/14> treated w/ XRT but slowly rising PSA since then, they are on Observation protocol w/ check ups every 31mo.24moe reviewed prob list, meds, xrays and labs> see below for updates >> Given PREVNAR-13 & Rx for Shingles vaccine...   EKG 10/14 by DrHochrein showed SBrady, rate52, borderline EKG, NAD...   ADDENDUM> PSA 2/15 by DrGrapey was 4.26...   ~  February 10, 2014:  31mo R57mond Sanav Derricoates that he is doing well, no new complaints or concerns;  He has a rising  PSA- followed by DrGrapey Q67mow/ PSADT est ~184mo last measured 4.26 in [id number] He saw Urology 3/15 & note reviewed (prev hx well outlined) and plan was to continue monitoring Q6m38motil PSA ~10 then consider hormone therapy...  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    HBP> on Aten25, Lisin10;  BP= 132/82 & wt=203# (down 5# last 17mo43moe  denies CP, palpit, SOB, dizzy, edema, etc...    CAD> on ASA81; followed by DrHochrein & last seen 10/14- remains stable, no changes made, f/u 54yr.22yr  Dyslipidemia> on Prav20; hx intol to Fenofibrate; FLP 6/14 showed TChol 169, TG 242, HDL 31, LDL 93 & we reviewed diet, exercise, & wt reduction...    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 We reviewed prob list, meds, xrays and labs> see below for updates >>   CXR 7/15 shows norm heart size & Ao atherosclerosis, bilat pulm nodules are visualized (new since 2/11 CXR) w/ largest ~2cm in LUL...    LABS 7/15:  FLP- not at goals w/ TG elev & HDL low;  Chems- ok x BS=135;  CBC- wnl;  TSH=1.38;  PSA=6.15...  CT Chest/ Abd/ Pelvis 7/15 showed numerous bilat pulm nodules; no signif findings in abd/pelvis x gallstones, adrenal adenoma, scat divertics, and atherosclerotic changes; degen changes in spine PLAN>  CXR shows recurrent lung nodules (assoc w/ his rising PSA); prev noted nodules from 2007 were assoc w/ PSA of 10.6 at time of Dx, and the CXR cleared w/o nodules seen in 2008, 2009, 2011; he has been followed by Urology w/ slowly rising PSA at 3.1 last yr and 4.26 earlier this yr, and now=6.15   With f/u CXR showing recurrent mult bilat pulm nodules (also seen at time of Prostate cancer Dx in 2007 & resolved (2008, 2009, 2011) after XRT & hormone therapy at time of dx...   PET Scan 02/20/14 showed numerous bilat  noncalcif pulm nodules, 5 measured w/ max SUV 1.6-4.6, coronary atherosclerosis, no skeletal metssmall right adrenal nodule (adenma favored)...  Nuclear Bone Scan 02/27/14 showed focal areas of uptake in spine (most likely degenerative) & knee/ shoulder- no convincing evid for mets to bone...  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  PLAN>  He has upcoming appt w/ DrGrapey for Urology   ~  June 18, 2014:  126moROV & VMasayukisaw DrGrapey 9/15 who agreed w/ our Dx of an unusual case of Prostate Cancer w/ mult lung metastasis; they started Rx w/ Lupron (planning shots Q434moand used Casodex for the 1st 30d only; he has f/u appt in Jan2016 w/ Urology for PSA & Testos levels; by his account he has been tolerating the Rx well w/o new issues until last week...  He states that on 11/23 he had a "head cold & chest congestion" so he went to the local [facility] & was evaluated & given Omnicef & a steroid shot in right buttock; on 11/24 he awoke w/ pain in his right leg (?hip area & thigh area mostly) & states he "couldn't walk" due to the pain & numbness; on 11/25 he went to his Chiro w/ XRays done (later told they showed "alot of issues in my lower back"); he has an inversion table he's used on & off for yrs but hasn't been on it recently; 11/27 was Thanksgiving & on 11/28 [facility] so much [facility] XRays of Lumbar sp w/ extensive degen changes, XRay of right hip w/ min degen arthritis, VenDopplers neg for DVT, no rash presenty, sl tender right hip but good ROM> they told him likely siatica- given Pred taper & Percocet5 which he has taken ~3/d & today notes sl improved...  Still no rash present & exam is unchanged from what is described- min tender, good ROM, essent neg SLR, symmetric reflexes and strength; note- [facility]...  Daughter  requests that he see DrLucey for Ortho eval & we will try to expedite this ASAP for pt...     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     Prostate Cancer w/ lung mets> 9/15 OV by DrGrapey reviewed; pt was started on Lupron shots (planned Q4mo326mogiven Casodex x30d to start; pt has f/u visit sched for [id number] labs (PSA & Testos) and 2nd Lupron injection... We reviewed prob list, meds, xrays and labs> see below for updates >>   CXR 12/15 showed signif improvement w/ decr in size & number of nodules... PLAN>> now w/ new onset of right leg pain- primarily right thigh area w/ some burning as well; no rash present & asked to watch for vesicular eruption & call if any changes; this is the vicinity of meralgia paresthetica & we discussed trial GABAPENTIN start 100mg626m; continue Pred taper given in ER and refilled the Oxycodone5 Tid + constip prophylactic regime w/ Miralax daily & Senakot-S Qhs... Plan ROV 6wks...  ~  July 30, 2014:  6wk ROV & VanceArkeems me he went to an Ortho in [place] who did MRI and referred him to NS- DrJenkins, told him he could do surg but advised holding off since his pain level is diminished, still taking the Neurontin100Tid & Oxycodone5 prn; we do not have notes from these consultants & he is asked to get records sent to us to[place] into Epic...     VanceEyoel/o a URI w/ cough, beige sput, sinus drainage, & chest soreness from the cough; he denies f/c/s, denies incr SOB, etc; we decided to treat w/ ZPak, Hycodan, Mucinex, fluids...     He has f/u appt w/ Urology, DrGrapey soon; as noted his last CXR 12/15 showed signif improvement w/ decr  in size & number of nodules (believed to be metastatic prostate cancer)...  We reviewed prob list, meds, xrays and labs> see below for updates >>  PLAN>> we decided to treat w/ ZPak, Hycodan, Mucinex, fluids; he will f/u in 70mow/ repeat  CXR...  ~  Nov 28, 2014:  428moOV & VaDanielaeports that is breathing is good, no problems reported- denies cough, sput, SOB, CP, etc;  His CC remains LBP- pt says MRI reveqaled bulging discs & he is sched for an epid steroid shot per DrJenkins (we still do [place]. He continues to f/u w/ Urology- DrGrapey & pt indicates that he's due for lab work next week & a hormone shot after that- again he is asked to request records be sent to usKorearom his specialists for us[place]ore[place] review & so these records will be avail in EPIC...  EXAM reveals Afeb, VSS, O2sat=99% on RA;  HEENT- neg, mallampati2;  Chest- clear w/o w/r/r;  Heart- RR w/o m/r/g;  Ext- neg w/o c/c/e...   CXR 5/16 shows regression of the bilat lung nodules & now resolved, norm heart size, NAD...  PLAN>>  VaArgeniss stable from the pulmonary standpoint; he has mult specialists attending his various problems & he is reminded to get records sent to us[place] review & scan into Epic...   ~  June 01, 2015:  15m70moV & VanBobports that he is doing well- he remains on Lupron shots per DrGrapey- last seen 04/13/15 & note reviewed- he's had a great response to the Lupron w/ PSA falling to zero & pulmonary metastatic prostate cancer nodules melting away; tolerating the shots well...  He had a ruptured bicep tendon in right arm repaired by DrKuzma 02/2015... 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    HBP> on Aten25, Lisin10;  BP= 132/84 & wt=203# (stable); he denies CP, palpit, SOB, dizzy, edema, etc...     CAD> on ASA81; followed by DrHochrein & last seen 10/15- remains stable, no changes made, overdue for yearly f/u visit w/ Cards...    Dyslipidemia> on Prav20; hx intol to Fenofibrate; FLP 11/16 showed TChol 169, TG 292, HDL 34, LDL 81 & we reviewed better diet, exercise, & wt reduction...    GI- GERD, Divertics> on Omep40;  He denies abd pain, dysphagia, n/v, c/d, blood seen; last colon by DrPatterson was 8/14 & showed mod divertics, no polyps, and he rec f/u colon for routine risk ~10y87yr Hx Prostate Cancer w/ lung mets> followed by DrGrapey w/ hx prostate ca 2007 w/ IMRT & hormone therapy x2yrs33yr has had slowly rising PSA= 3.21 [id number]= 4.26 [id number]= 6.15 Jul2015=> Dx w/ metastatic ca to lungs; treated by DrGrapey w/ Lupron shots and PSA ret to zero w/ resolution of the pulm nodules...    DJD> on OTC analgesics as needed; he has seen DrDaldorf in the past for left knee pain...    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 EXAM  reveals Afeb, VSS, O2sat=98% on RA;  HEENT- neg, mallampati2;  Chest- clear w/o w/r/r;  Heart- RR w/o m/r/g;  Abd- soft, nontender, neg;  Ext- neg w/o c/c/e...   LABS 05/2015>  FLP- ok on Prev20 x TG=292;  Chems- ok x BS=147;  CBC- wnl;  TSH=1.41... PSAs checked by DrGrapey 7 reported to be= zero... IMP/PLAN>>  Abubakar is stable on current regimen;  Given 2016 Flu vaccine; rec to continue same and we plan ROV in 77mo..  ~  December 29, 2015:  665moOV & VaAverilleports doing well overall;  He tells me that he has several friends on Gabapentin for back pain & he wants to try it if at all poss- we discussed this med & agreed to Rx w/ 10033mhs=> incr to 200m64m tolerated;  See prob list above...     BP/ CAD well controlled on ASA, Aten25 & Lisin10; BP=134/80 & he denies HA, visual sx, CP, palpit, SOB, edema, etc...    He remains on Prav20 + FishOil and FLP 11/16 showed elev TG- advised same med, better low fat diet,  lose 10 lbs...    He has been trying to control his DM w/ diet alone- but labs today showed BS=132, A1c=7.4 & we decided to start METFORMIN 500mg57m...    He has hx of prostate Ca w/ lung mets- followed by DrGrapey & last seen ~6wks ago- on Lupron shots and pt reports that PSA remains zero!    VanceToussaintDJD, LBP/ bulging discs on Percocet10 prn, using sparingly & ave 1/wk; he tells me that he takes Aleve Bid & wants to try the Gabapentin as above... EXAM reveals Afeb, VSS, O2sat=96% on RA;  HEENT- neg, mallampati2;  Chest- clear w/o w/r/r;  Heart- RR w/o m/r/g;  Abd- soft, nontender, neg;  Ext- neg w/o c/c/e...   CXR 12/29/15 showed norm heart size, clear lungs w/o nodules, Tspine degen changes, NAD...  LABS 12/29/15>  Chems- ok x BS=132;  A1c=7.4... IMMarland KitchenMarland KitchenPLAN>>  VanceSailortable overall w/ his mult medical issues- we decided to start METFORM500 Qam & reviewed low carb diet, need for wt reduction;  We also started trial Gabapentin per his request for back issues;  We plan ROV 79mo, 9moer if needed prn...  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 Jeff JenEarle Gellhiro, told bulging discs & old XRays shHoover Brunetteextensive degen changes in lumbar spine; he [place] & DrJenkins told him he could do surg but advised holding off since his pain at that time was diminished; Pt recently requested trial of Gabapentin (just like several friends) & now requests f/u appt w/ neurosurg... 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    HBP> on Aten25, Lisin10;  BP= 130/84 & wt=194# (BMI=27); he denies CP, palpit, SOB, dizzy, edema, etc...    CAD> on ASA81; followed by DrHochrein & last seen 10/15- remains stable, no changes made, & overdue for f/u visit w/ Cards...    Dyslipidemia (mixed)> on Prav20; hx intol to Fenofibrate; FLP 12/17 shows TChol 186, TG 251, HDL 40, LDL 96 & we reviewed better low carb/ low fat diet, exercise, & wt reduction...    GI- GERD, Divertics> on Omep40;  He denies abd pain, dysphagia, n/v, c/d, blood seen; last colon by DrPatterson was 8/14 & showed mod divertics, no polyps, and he rec f/u colon for routine risk ~61yr    Hx Prostate Cancer w/ lung mets> followed by DrGrapey w/ hx prostate ca 2007 w/ IMRT & hormone therapy x253yr he has had slowly rising PSA= 3.21 [id number]= 4.26 [id number]= 6.15 Jul2015=> Dx w/ metastatic ca to lungs; treated by DrGrapey w/ Lupron shots and PSA ret to zero w/ resolution of the pulm nodules...    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 EXAM reveals Afeb, VSS, O2sat=96% on RA;  HEENT- neg, mallampati2;  Chest- clear w/o w/r/r;  Heart- RR w/o m/r/g;  Abd- soft, nontender, neg;  Back- sl tender, decr  ROM, abn SLR;  Ext- neg w/o c/c/e;  Neuro- w/o deficits...  LABS 06/29/16>  FLP- not at goals w/ TG=251;  Chems- ok x BS=138, A1c=6.9 on diet alone & he doesn't want meds (asked to really get on diet etc);  CBC- wnl;  TSH= 1.41  MRI Lumbar spine => pending IMP/PLAN>>  VancHastonts to control his DM w/o meds & we reviewed the diet/ exercise/ wt reduction required!  His FLP is not at goal either & diet is the key here to reduction in the TGs;  His CC is LBP & we discussed rest, heat, Pred dosepak & refer to NS DrJJenkins (his office called & no appt for 73mo-37mo to get new MRI Lumbar & get results to them);  OK 2017 FLU vaccine today...  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   ~  December 28, 2016:  20mo R49mo Delores Matthants stable- "just my back" meaning his LBP, worse w/ activity etc; he has seen drDaldorf for Ortho, DrJenkins for NS, and a chiro; he tells me [place] we set him up to see DrJenkins Neurosurg & he wanted us to [place] MRI 1st- this was done 06/2016 w/ progressive spondylosis L2-3 w/ bulging disc & nerve root compression in the thecal sac=> I do not have recent notes from him; pt reports "shot" w/o much relief prev;  We discussed Tramadol + Tylenol Rx...  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    HBP> on  Aten25, Lisin10;  BP= 132/70 & wt=194# (BMI=27); he denies CP, palpit, SOB, dizzy, edema, etc...    CAD> on ASA81; followed by DrHochrein & last seen 10/15- remains stable, no changes made, & overdue for f/u visit w/ Cards...    Dyslipidemia (mixed)> on Prav20; hx intol to Fenofibrate; FLP 12/17 shows TChol 186, TG 251, HDL 40, LDL 96 & we reviewed better low carb/ low fat diet, exercise, & wt reduction...    GI- GERD, Divertics> on Omep40;  He denies abd pain, dysphagia, n/v, c/d, blood seen; last colon by DrPatterson was 8/14 & showed mod divertics, no polyps, and he rec f/u colon for routine risk ~37yr    Hx Prostate Cancer w/ lung mets> followed by DrGrapey w/ hx prostate ca 2007 w/ IMRT & hormone therapy x268yr he has had slowly rising PSA= 3.21 [id number]= 4.26 [id number]= 6.15 Jul2015=> Dx w/ metastatic ca to lungs; treated by DrGrapey w/ Lupron shots and PSA ret to zero w/ resolution of the pulm nodules (getting Lupron Q6m372mo.    DJD> on OTC analgesics as needed; he has seen DrDaldorf in the past for left knee pain, DrJJenkins and Chiro for LBP w/ prev MRI in [place] (?results); he presented 12/17 w/ worsening/ persistent LBP & requesting NS referral => DrJenkins booked for 33mo58moequested us [place] Lumbar [place] w/ copy to him; Rx w/ rest/ heat/ Gabapentin/ Pred dosepak...    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 EXAM reveals Afeb, VSS, O2sat=96% on RA;  HEENT- neg, mallampati2;  Chest- clear w/o w/r/r;  Heart- RR w/o m/r/g;  Abd- soft, nontender, neg;  Back- sl tender, decr ROM, abn SLR;  Ext- neg w/o c/c/e;  Neuro- w/o deficits...  LABS 12/28/16>  Chems- ok w/ BS=137, Cr=0.79;  HgA1c=7.1 and we discussed MetformER '500mg'$  Qam... IMP/PLAN>>  Saivion's CC is his back pain, under the care of NS- DrJenkins and he is awaiting f/u visit to discuss options;  We will Rx w/ Tramadol50 + tylenol;  Labs showed worsening BS/ A1c=> mild DM & rec to  start MetformER500;  He continues to f/u w/ NS & Urology;  Cardiac stable w/o CP, palpit, etc...   ~  June 29, 2017:  72mo 672mo& VanceDamarisrts a good interval w/ CC= back & left leg pain w/ f/u neurosurg appt sched for next week...  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     He saw UROLOGY-DrGrapey 05/01/17>  F/u prostate cancer; on androgen depriv therapy since 2015, PSAs have been ~zero; he had pulm metastatic dis, neg bone scan, getting Lupron Q72mo,93mo  DEXA was ok in 2017, CXR remains clear, no mets in lumbar spine;  Labs 04/24/17 showed PSA=0.10 & Testos=12.7;  Given Lupron '45mg'$  IM...  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    HBP> on Aten25, Lisin10;  BP= 128/78 & wt=180# down 14# (BMI=25); he denies CP, palpit, SOB, edema, but has had some dizziness esp when he changes positions from supine to standing=> stop Aten25 & start Amlod5...    CAD> on ASA81; followed by DrHochrein & last seen 10/15- remains stable, no changes made, & overdue for f/u visit w/ Cards...    Dyslipidemia (mixed)> on Prav20; hx intol to Fenofibrate; FLP 12/17 shows TChol 186, TG 251, HDL 40, LDL 96 & we reviewed better low carb/ low fat diet, exercise, & wt  reduction...    DM> Labs 12/17 showed BS=138 & A1c=6.9- he wanted diet alone; recheck labs 6/18 showed BS=137 & HgA1c=7.1=> started on MetformER500; f/u labs 12/18 showed BS=115, A1c=6.3 but he c/o diizy & we changed dose to eve.    GI- GERD, Divertics> on Omep40;  He denies abd pain, dysphagia, n/v, c/d, blood seen; last colon by DrPatterson was 8/14 & showed mod divertics, no polyps, and he rec f/u colon for routine risk ~24yr    Hx Prostate Cancer w/ lung mets> followed by DrGrapey w/ hx prostate ca 2007 w/ IMRT & hormone therapy x257yr he has had slowly rising PSA= 3.21 [id number]= 4.26 [id number]= 6.15 Jul2015=> Dx w/ metastatic ca to lungs; treated by DrGrapey w/ Lupron shots and PSA ret to zero w/ resolution of the pulm nodules (getting Lupron Q6m41mo.    DJD> on OTC analgesics as needed; he has seen DrDaldorf in the past for left knee pain, DrJJenkins and Chiro for LBP w/ prev MRI in [place] (?results); he presented 12/17 w/ worsening/ persistent LBP & requesting NS referral => DrJenkins booked for 67mo540moequested us tKoreacheck Lumbar MRI w/ copy to him; Rx w/ rest/ heat/ Gabapentin/ Pred dosepak...    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 EXAM reveals Afeb, VSS, O2sat=95% on RA;  HEENT- neg, mallampati2;  Chest- clear w/o w/r/r;  Heart- RR w/o m/r/g;  Abd- soft, nontender, neg;  Back- sl tender, decr ROM, abn SLR;  Ext- neg w/o c/c/e;  Neuro- w/o deficits...  CXR 06/29/17 (independently reviewed by me in the PACS system) showed norm heart size, clear lungs- NAD  EKG 06/29/17>  SBrady, rate55, 1st degree AVB, otherw wnl...   LABS 06/2017>  Chems- ok w/ BS=115, Cr=0.83, A1c=6.3...  Marland KitchenMarland KitchenP/PLAN>>  We decided to stop his Aten25 & try Amlod5;  Switch the MetformER from AM to PM at dinner; ok flu shot today...   ~  September 18, 2017:  40mo 82mo& pre-op medical clearance>  VanceQuintaviusched for L-THR, we don't have notes from Ortho & his CC has  been LBP; his pain doc is DrBartko who gave him shots in his back & did hip XRays & found it  bone on bone=> DrAlusio planning THR...  SEE PROBLEM LIST ABOVE (06/29/17 OV)- we reviewed problems and meds w/ pt...     HBP- Aten25 was changed to [facility] due to dizziness & this symptom resolved...  EXAM reveals Afeb, VSS, O2sat=98% on RA;  HEENT- neg, mallampati2;  Chest- clear w/o w/r/r;  Heart- RR w/o m/r/g;  Abd- soft, nontender, neg;  Back- sl tender, decr ROM, abn SLR;  Ext- neg w/o c/c/e;  Neuro- w/o deficits...  LABS 09/19/17>  FLP- ok w/ parameters at goals x TG=189;  Chems- ok x K=5.2, BS=131, A1c=6.6, Cr=0.80, LFTs wnl;  CBC- wnl w/ Hg=13.7, WBC=7.0;  TSH=2.09;  PSA=0.13 on Lupron per Urology. IMP/PLAN>>  Pre-op eval for planned L-THR by drAlusio; Jaidin is OK for surg, Labs are satis, CXR 12/2018was satis- no acute cardiopulm dis, neg for nodules, & EKG 12/18 showed SBrady at 55/min 1st degree AVB otherw wnl; prostate ca under control now w/ Lupron...  NOTE:  >50% of this 25 min rov was spent in counseling & coordination of care...         Problem List:    BRONCHITIS, RECURRENT (ICD-491.9) - no recent URI symptoms... 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 ~  CXR 2007 when coronary stent placed showed ?sm nodules seen... ~  CT Chest 8/07 showed bilat noncalcif lung nodules, varying size in all lobes & largest= 9.15m in LLL, no adenopathy... note: PSA=10.6 ~  CT Abd&Pelvis 9/07 showed mult sm lung nodules at bases bilat, gallstones, sm duod divertic, scat atherosclerotic calcif, no mass or adenop, asymmetric enlarged prostate...  ~  CT Chest 12/07 showed mult pulm nodules bilat- no change, no adenopathy, coronary calcif...  ~  f/u CXR 8/08 without nodules seen... (note: PSA= 0.03) ~  f/u CXR 11/09 clear, no lesions seen... ~  f/u CXR 2/11 showed clear, NAD..Marland Kitchen (note: PSA= 0.57) ~  CXR 7/15 shows norm heart size & Ao  atherosclerosis, bilat pulm nodules are visualized (new since 2/11 CXR) w/ largest ~2cm in LUL... 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 ~  CXR 12/15 (after Lupron shot & 155mof Casodex) showed signif improvement w/ decr in size & number of nodules ~  1/16: he returned w/ URI- cough, sput, chest soreness and we decided to treat w/ ZPak, Hycodan, Mucinex, fluids... ~  CXR 5/16 shows resolution of the prev metastatic nodules, heart size is wnl, NAD...Marland Kitchen ~  CXR 6/17 showed norm heart size, clear lungs w/o nodules, Tspine degen changes, NAD...  HYPERTENSION (ICD-401.9) - contolled on ATENOLOL '25mg'$ /d, & LISINOPRIL '10mg'$ /d...  ~  9/11:  BP=110/62 here and usually 120s/ 70s at home- denies HA, fatigue, visual changes, CP, palipit, dizziness, syncope, dyspnea, edema, etc... ~  7/14:  on Aten25, Lisin10;  BP= 142/78 & wt=199# (same as 2011); he denies CP, palpit, SOB, dizzy, edema, etc. ~  7/15: on Aten25, Lisin10;  BP= 132/82 & wt=203# (down 5# last 63m23mohe remains asymptomatic... ~  12/15: on Aten25, Lisin10; BP= 140/90 & wt=202#; he denies CP, palpit, SOB, edema... ~  5/16: on Aten25, Lisin10; BP= 140/90 & he is asymptomatic... Reminded to elim sodium. ~  6/17-12/17:  well controlled on ASA, Aten25 & Lisin10; BP=130/80 range & he denies HA, visual sx, CP, palpit, SOB, edema, etc;...  CAD (ICD-414.00) -  followed by DrHochrein... on ASA '325mg'$ /d. ~  NuclearStressTest 8/07 abnormal w/ ant ischemia... ~  cath 8/07 showed 95% mid-LAD stenosis, & 20% lesions in the other 2 vessels, EF=60%... subseq PTCA w/ non-drug eluting stent... ~  OV w/ Cards= 5/10 & note reviewed- BP sl elevated & Lisinopril added. ~  He saw DrHochrein 10/13> doing well, no new symptoms, exam was neg, no change in meds & rec aggressive risk factor reduction strategy...  ~  EKG 10/13 showed NSR, rate62, LAD, otherw wnl, NAD... ~  He  saw DrHochrein 10/14- on above + ASA; stable & asymptomatic, no changes made, f/u 76yr~  He saw DrHochrein 10/15> HBP, CAD, HL- stable w/o CP/ palpit/ SOB/ edema/ etc (despite being diagnosed w/ met prostate ca to lungs)...  DYSLIPIDEMIA (ICD-272.4) - on PRAVACHOL '20mg'$ /d and tol OK...  ~  [place]8/08 showed TChol 168, TG 143, HDL34, LDL105 ~  FLP 2/09 showed TChol 163, TG 207, HDL 34, LDL 91 ~  FLP 5/10 showed TChol 148, TG 131, HDL 33, LDL 89 ~  FLP 9/11 showed TChol 163, TG 140, HDL 42, LDL 93 ~  FLP 6/14 on Prav20 showed TChol 169, TG 242, HDL 31, LDL 93  ~  FLP 7/15 on Prav20 showed TChol 174, TG 290, HDL 31, LDL 87... We reviewed low fat diet & need for wt reduction. ~  FLP 11/16 on Prav20 showed TChol 169, TG 292, HDL 34, LDL 81... Ditto ~  [place]17 on Prav20 showed TChol 186, TG 251, HDL 40, LDL 96... Needs better low carb/ low fat/ wt reducing diet.  IFG=> Diabetes Mellitus> ~  Hx BS betw 130-150 on diet alone... ~  6/17:  Labs showed BS=132, A1c=7.4 and we decided to start METFORMIN '500mg'$  Qam => pt took it for 170mo stopped on his own for dizziness. ~  12/17:  Labs showed BS=138, A1c=6.9; he wants to control on diet alone- we reviewed low carb/ low fat, wt redcing diet...  GERD (ICD-530.81) - prev on OMEP20 but causes diarrhea he says, and ACIPHEX '20mg'$ /d works better...  ~  last EGD (DrPatterson)was 8/04 showing GERD & stricture dilated... ~  5/11: presented to GI w/ recurrent dysphagia- EGD showed 3cmHH, stricture dilated, no Barrett's, changed to Aciphex. ~  On Omep40 & he remains asymptomatic w/o dysphagia, CP, abd pain, n/v, c/d, blood seen...  DIVERTICULOSIS OF COLON (ICD-562.10) >>  ~  Colonoscopy 8/04 by DrPatterson was normal, f/u 1062yrscattered tics seen on 1996 flex) ~  7/14:  He is due for f/u colon 7 we will refer the chart to DrPatterson for review... ~  Colonoscopy 8/14 by DrPatterson showed mod divertics, no polyps, and he rec f/u colon for routine risk  ~10y57yr  PROSTATE CANCER (ICD-185) - followed by DrPeterson- elevated PSA found 8/07 (10.6)... biopsies were pos (Stage T2b, bilat dis w/ Gleason4+3=7 on one side & 8 on the other) & there is a family hx as well; he had bilat pulm nodules on CXR & CT Chest at diagnosis... [facility], then [facility] Rx for 2 y[facility]... [facility] prev on TRELSTAR injections q 3mon49monthROVERA for the hot flashes>> x2yrs;49yrg bone scan 1/08;  PSA here= 0.03 in 2008, and CXR cleared w/o nodules seen in 2008, 2009, 2011 (see above)... ~  2/11: we don't have recent note from DrPetePagewill request info sent to us at [place] visit. ~  9/11: labs  here showed PSA= 0.57 & we will forward to [place] ~  9/13:  He had f/u visit w/ DrGrapey> PSA had been slowly rising and ~1 when they last checked; DrGrapey rec Q11mof/u  ~  7/14:  Pt missed his 3/14 appt w/ Urology & is sched to see them 8/14; PSA recorded 6/14 = 3.21 & we sent copy to DrGrapey... ~  3/15:  He had f/u DrGrapey> prostate Ca dx 2007, [facility] therapy (x270yr & IMRT in 2008; slowly rising PSA w/ doubling time ~1252mow; DrGrapey follows pt Q12mo48molan is for hormone therapy when PSA>10... ~  7/15:  Routine CXR showed return of bilat pulm nodules assoc w/ PSA= 6.15  (CT Chest, Abd, Pelvis is planned & discussion w/ DrGrapey & Oncology)=> Dx w/ metastatic prostate adenocarcinoma (lung mets) ~  9/15: he was started on Lupron shots Q4mo,62moen Casodex daily x30d per DrGrapey; f/u OV planned [id number]  12/15: CXR showed improvement w/ decr in size & number of lung nodules... He has a follow up appt w/ DrGrapey in Jan2016. ~  He remains under the care of DrGrapey for Urology Q12mo- 412moUPRON shots, c/o some hot flashes, Labs show PSA<0.04 & Testos low at 32  DEGENERATIVE JOINT DISEASE (ICD-715.90) >>  RIGHT THIGH AREA PAIN 12/15 >> ?etiology, [facility] & by [facility]... ~  his left knee gives him some pain on and off; he saw DrDaldorf for this 12/08 & he thought there might be a torn meniscus, they decided to Rx w/ NAPROSEN which helps; may yet need MRI/ arthroscopy... ~  2/11: discussed trial of Mobic to see if this works for him. ~  12/15: presented w/ right thigh area pain ?[facility]iology [facility] w/ extensive degen changes in lumbar spine; sl improved w/ Pred & [facility]ocet [facility], ?could this be meralgia paresthetica? They want to see DrLucey for Ortho eval, we will give trial GABAPENTIN100Tid, watch for rash (he had Shingles vaccine 2 mo ago)... ~  1/16: Trevan Krystopher me he went to an Ortho in [place] who did MRI and referred him to NS- DrJenkins, told him he could do surg but advised holding off since his pain level is diminished, still taking the Neurontin100Tid & Oxycodone5 prn; we do not have notes from these consultants & he is asked to get records sent to us to [place] into Epic. ~  6/17:  Jarmaine Durene Fruitsts that he has several frinds on Gabapentin w/ relief of back pain & he requests a trial- OK start w/ '100mg'$  Qhs & incr to '200mg'$  if tol... ~  12/17:  Pt c/o LBP & tried Neurontin w/o relief, Ortho in [place], prev chiro w/o relief => requests referral to NS, DrJJenkins but no openings for months so we will check Lumbar MRI 7 rx w/ rest, heat, Pred dosepak...  HEALTH MAINTENANCE >>  ~  GI>  [facility]- f/u due now... ~  GU>  Followed by DrGrapey & seen every 12mo fo67moA recheck but he missed the 3/14 appt & our PSA 6/14= 3.21 (copy to Urology & he has appt 8/14)... ~  Immuniz>  He gets the yearly flu vax (last 10/13);  Had Pneumovax several yrs ago;  He received the PREVNAR-13 vax 1/15; Not sure of last Tetanus vaccine; Asking about the shingles shot=> Rx  written & reveived [facility]  10/15...   Past Surgical History:  Procedure Laterality Date  . DISTAL BICEPS TENDON REPAIR Right 03/12/2015   Procedure: REPAIR/RECONSTRUCTION RIGHT BICEPS TENDON;  Surgeon: Daryll Brod, MD;  Location: [place];  Service: Orthopedics;  Laterality: Right;  . eyelid surgery for Lid Lag    . FOOT SURGERY    . HYDROCELE EXCISION / REPAIR    . INGUINAL HERNIA REPAIR     78 yrs old  . lung nodules    . MASTECTOMY     benign tumor  . Prostate treatment     cancer  . SKIN CANCER EXCISION  2012   skin cancer on right ear    Outpatient Encounter Medications as of 09/18/2017  Medication Sig  . amLODipine (NORVASC) 5 MG tablet Take 1 tablet (5 mg total) by mouth daily.  Marland Kitchen b complex vitamins tablet Take 1 tablet by mouth daily.  . Calcium-Magnesium-Vitamin D (CALCIUM 1200+D3 PO) Take 1 tablet by mouth daily.  . Cyanocobalamin (VITAMIN B-12 PO) Take 1 tablet by mouth daily.  Marland Kitchen GLUCOSAMINE SULFATE PO Take 1 tablet by mouth 2 (two) times daily.  Marland Kitchen lisinopril (PRINIVIL,ZESTRIL) 10 MG tablet TAKE 1 TABLET BY MOUTH EVERY DAY  . LORazepam (ATIVAN) 1 MG tablet TAKE ONE-HALF TO 1 TABLET BY MOUTH THREE TIMES DAILY AS NEEDED FOR ANXIETY  . Magnesium 400 MG CAPS Take by mouth daily.  . meloxicam (MOBIC) 15 MG tablet Take 15 mg by mouth daily.  . metFORMIN (GLUCOPHAGE XR) 500 MG 24 hr tablet Take 1 tablet (500 mg total) by mouth daily with breakfast.  . Multiple Vitamins-Minerals (MULTIVITAMIN PO) Take 1 tablet by mouth daily.  . naproxen sodium (ANAPROX) 220 MG tablet Take 220 mg by mouth 2 (two) times daily with a meal.  . Omega-3 Fatty Acids (FISH OIL) 1000 MG CAPS Take 1,000 mg by mouth daily.  Marland Kitchen omeprazole (PRILOSEC) 40 MG capsule TAKE ONE CAPSULE BY MOUTH EVERY DAY  . POTASSIUM CHLORIDE PO Take by mouth daily.  . pravastatin (PRAVACHOL) 20 MG tablet Take 1 tablet (20 mg total) by mouth daily.  . traMADol (ULTRAM) 50 MG tablet Take 1 tablet (50 mg total) by mouth 3 (three) times daily  as needed.  . [DISCONTINUED] gabapentin (NEURONTIN) 100 MG capsule TAKE 1 CAPSULE BY MOUTH EVERY NIGHT AT BEDTIME FOR 3 WEEKS, THEN 2 CAPSULES BY MOUTH EVERY NIGHT AT BEDTIME THEREAFTER (Patient taking differently: 2 CAPSULES BY MOUTH EVERY NIGHT AT BEDTIME THEREAFTER)  . aspirin 81 MG tablet Take 81 mg by mouth daily.  Marland Kitchen atenolol (TENORMIN) 25 MG tablet TAKE 1 TABLET BY MOUTH EVERY DAY (Patient not taking: Reported on 09/18/2017)   No facility-administered encounter medications on file as of 09/18/2017.     Allergies  Allergen Reactions  . Simvastatin Other (See Comments)     pt states INTOL to ZOCOR w/ leg pains    Immunization History  Administered Date(s) Administered  . Influenza Split 05/08/2012, 06/03/2013  . Influenza Whole 08/19/2009  . Influenza, High Dose Seasonal PF 06/29/2016, 06/29/2017  . Influenza,inj,Quad PF,6+ Mos 04/18/2014, 06/01/2015  . Pneumococcal Conjugate-13 08/13/2013  . Tdap 02/10/2014    Current Medications, Allergies, Past Medical History, Past Surgical History, Family History, and Social History were reviewed in [employer] record.   Review of Systems         See HPI - The patient denies anorexia, fever, weight loss, weight gain, vision loss, decreased hearing, hoarseness,  chest pain, syncope, dyspnea on exertion, peripheral edema, prolonged cough, headaches, hemoptysis, abdominal pain, melena, hematochezia, severe indigestion/heartburn, hematuria, incontinence, muscle weakness, suspicious skin lesions, transient blindness, difficulty walking, depression, unusual weight change, abnormal bleeding, enlarged lymph nodes, and angioedema.     Objective:   Physical Exam    WD, WN, 77 y/o WM in NAD... GENERAL:  Alert & oriented; pleasant & cooperative... HEENT:  [place]/AT, EOM-wnl, PERRLA, EACs-clear, TMs-wnl, NOSE-clear, THROAT-clear & wnl. NECK:  Supple w/ fairROM; no JVD; normal carotid impulses w/o bruits; no thyromegaly or nodules  palpated; no lymphadenopathy. CHEST:  Clear to P & A; without wheezes/ rales/ or rhonchi heard... HEART:  Regular Rhythm; without murmurs/ rubs/ or gallops detected... ABDOMEN:  Soft & nontender; normal bowel sounds; no organomegaly or masses palpated... EXT: without deformities, mild arthritic changes; no varicose veins/ venous insuffic/ or edema.     c/o pain in low back, some SLR difficulty, reflexes symmetric, no rash apparent... NEURO:  CN's intact; motor testing normal; sensory testing normal; gait normal & balance OK. DERM:  No lesions noted; no rash etc...  RADIOLOGY DATA:  Reviewed in the EPIC EMR & discussed w/ the patient...  LABORATORY DATA:  Reviewed in the EPIC EMR & discussed w/ the patient...   Assessment:      09/18/17>   Pre-op eval for planned L-THR by DrAlusio; Shemar is OK for surg, Labs are satis, CXR 12/2018was satis- no acute cardiopulm dis, neg for nodules, & EKG 12/18 showed SBrady at 55/min 1st degree AVB otherw wnl; prostate ca under control now w/ Lupron; continue same meds...   Hx Bronchitis, and Mult Pulm Nodules>  His breathing has been fine, at baseline w/o new complaints etc;  Previous mult pulm nodules from 2007 were likely prostate ca mets that resolved w/ his treatment IMRT 2008 and hormone therapy x18yr;  CXRs in 2008, 2009, & 2011 were clear;  CXR 7/15 showed return of bilat pulm nodules (assoc w/ rising PSA=6.15) & subseq CT Chest, PET scan, Bone scan, Needle bx lung lesion confirmed Dx metastatic prostate cancer in lung. 9/15> DrGrapey started treatment w/ Lupron Q427moCasodex x30d at outset... 12/15> CXR already shows signs of improvement w/ decr size & number of lesions... 5/16> CXR nodules have resolved w/ ongoing treatment for his prostate cancer... 6/17>  He remains on Lupron from DrGrapey w/ PSA reported at zero & CXR remains clear  HBP>  Stable on low dose Aten & Lisin, continue same...  CAD>  Followed by DrHochrein; seen last 10/14 & he  remains stable, no changes made; needs to incr his exercise program...  Mixed Dyslipidemia>  On Prav20 but TG & HDL deranged; needs better diet, exercise, & wt reduction...  DM2>  Controlled on diet + MetforminER500...  GI- GERD, Diverics, needs f/u colon>  F/u colon 8/14 was neg x divertics, no polyps, f/u rec for 1064yr.  Prostate Ca>  Managed by DrGrapey as noted; rising PSA indicates recurrent dis and mult lung nodules represent metastatic dis... ~  Now improved on Lupron shots w/ PSA <0.04 & Testos ~32...  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 Right leg pain ?etiology> we treated empirically w/ Neurontin & prn oxycod; he went to see Ortho in [place], MRI done, referred to NS- DrJJenkins & we will call for his notes...  Anxiety>  He is requesting Rx- try ALPRAZOLAM 0.'5mg'$  tid as needed...      Plan:     Patient's Medications  New Prescriptions  No medications on file  Previous Medications   AMLODIPINE (NORVASC) 5 MG TABLET    Take 1 tablet (5 mg total) by mouth daily.   ASPIRIN 81 MG TABLET    Take 81 mg by mouth daily.   ATENOLOL (TENORMIN) 25 MG TABLET    TAKE 1 TABLET BY MOUTH EVERY DAY   B COMPLEX VITAMINS TABLET    Take 1 tablet by mouth daily.   CALCIUM-MAGNESIUM-VITAMIN D (CALCIUM 1200+D3 PO)    Take 1 tablet by mouth daily.   CYANOCOBALAMIN (VITAMIN B-12 PO)    Take 1 tablet by mouth daily.   GLUCOSAMINE SULFATE PO    Take 1 tablet by mouth 2 (two) times daily.   LISINOPRIL (PRINIVIL,ZESTRIL) 10 MG TABLET    TAKE 1 TABLET BY MOUTH EVERY DAY   LORAZEPAM (ATIVAN) 1 MG TABLET    TAKE ONE-HALF TO 1 TABLET BY MOUTH THREE TIMES DAILY AS NEEDED FOR ANXIETY   MAGNESIUM 400 MG CAPS    Take by mouth daily.   MELOXICAM (MOBIC) 15 MG TABLET    Take 15 mg by mouth daily.   METFORMIN (GLUCOPHAGE XR) 500 MG 24 HR TABLET    Take 1 tablet (500 mg total) by mouth daily with breakfast.   MULTIPLE VITAMINS-MINERALS (MULTIVITAMIN PO)    Take 1 tablet by mouth daily.    NAPROXEN SODIUM (ANAPROX) 220 MG TABLET    Take 220 mg by mouth 2 (two) times daily with a meal.   OMEGA-3 FATTY ACIDS (FISH OIL) 1000 MG CAPS    Take 1,000 mg by mouth daily.   OMEPRAZOLE (PRILOSEC) 40 MG CAPSULE    TAKE ONE CAPSULE BY MOUTH EVERY DAY   POTASSIUM CHLORIDE PO    Take by mouth daily.   PRAVASTATIN (PRAVACHOL) 20 MG TABLET    Take 1 tablet (20 mg total) by mouth daily.   TRAMADOL (ULTRAM) 50 MG TABLET    Take 1 tablet (50 mg total) by mouth 3 (three) times daily as needed.  Modified Medications   Modified Medication Previous Medication   GABAPENTIN (NEURONTIN) 100 MG CAPSULE gabapentin (NEURONTIN) 100 MG capsule      TAKE 1 CAPSULE BY MOUTH EVERY NIGHT AT BEDTIME FOR 3 WEEKS, THEN 2 CAPSULES BY MOUTH EVERY NIGHT AT BEDTIME THEREAFTER    TAKE 1 CAPSULE BY MOUTH EVERY NIGHT AT BEDTIME FOR 3 WEEKS, THEN 2 CAPSULES BY MOUTH EVERY NIGHT AT BEDTIME THEREAFTER  Discontinued Medications   No medications on file

## 2017-09-23 ENCOUNTER — Other Ambulatory Visit: Payer: Self-pay | Admitting: Internal Medicine

## 2017-09-29 ENCOUNTER — Telehealth: Payer: Self-pay | Admitting: Pulmonary Disease

## 2017-09-29 MED ORDER — TRAMADOL HCL 50 MG PO TABS: 50.0000 mg | ORAL_TABLET | Freq: Three times a day (TID) | ORAL | 0 refills | Status: DC | PRN

## 2017-09-29 NOTE — Telephone Encounter (Signed)
Per SN- Ok to refill Tramadol 50mg  #90 by mouth three times a day as needed for pain. Called in at [facility] in [place]. Nothing further needed at this time.

## 2017-09-29 NOTE — Telephone Encounter (Signed)
SN please advise patient is requesting a refill of the tramadol. Last refill was 2.13.19 refilled by Dr. Melvyn Novas. SN is this ok to refill, thanks.    Current Outpatient Medications on File Prior to Visit  Medication Sig Dispense Refill  . amLODipine (NORVASC) 5 MG tablet Take 1 tablet (5 mg total) by mouth daily. 30 tablet 11  . aspirin 81 MG tablet Take 81 mg by mouth daily.    Marland Kitchen atenolol (TENORMIN) 25 MG tablet TAKE 1 TABLET BY MOUTH EVERY DAY (Patient not taking: Reported on 09/18/2017) 30 tablet 3  . b complex vitamins tablet Take 1 tablet by mouth daily.    . Calcium-Magnesium-Vitamin D (CALCIUM 1200+D3 PO) Take 1 tablet by mouth daily.    . Cyanocobalamin (VITAMIN B-12 PO) Take 1 tablet by mouth daily.    Marland Kitchen gabapentin (NEURONTIN) 100 MG capsule TAKE 1 CAPSULE BY MOUTH EVERY NIGHT AT BEDTIME FOR 3 WEEKS, THEN 2 CAPSULES BY MOUTH EVERY NIGHT AT BEDTIME THEREAFTER 60 capsule 0  . GLUCOSAMINE SULFATE PO Take 1 tablet by mouth 2 (two) times daily.    Marland Kitchen lisinopril (PRINIVIL,ZESTRIL) 10 MG tablet TAKE 1 TABLET BY MOUTH EVERY DAY 90 tablet 3  . LORazepam (ATIVAN) 1 MG tablet TAKE ONE-HALF TO 1 TABLET BY MOUTH THREE TIMES DAILY AS NEEDED FOR ANXIETY 90 tablet 5  . Magnesium 400 MG CAPS Take by mouth daily.    . meloxicam (MOBIC) 15 MG tablet Take 15 mg by mouth daily.    . metFORMIN (GLUCOPHAGE XR) 500 MG 24 hr tablet Take 1 tablet (500 mg total) by mouth daily with breakfast. 30 tablet 11  . Multiple Vitamins-Minerals (MULTIVITAMIN PO) Take 1 tablet by mouth daily.    . naproxen sodium (ANAPROX) 220 MG tablet Take 220 mg by mouth 2 (two) times daily with a meal.    . Omega-3 Fatty Acids (FISH OIL) 1000 MG CAPS Take 1,000 mg by mouth daily.    Marland Kitchen omeprazole (PRILOSEC) 40 MG capsule TAKE ONE CAPSULE BY MOUTH EVERY DAY 90 capsule 3  . POTASSIUM CHLORIDE PO Take by mouth daily.    . pravastatin (PRAVACHOL) 20 MG tablet Take 1 tablet (20 mg total) by mouth daily. 30 tablet 11  . traMADol (ULTRAM) 50 MG  tablet Take 1 tablet (50 mg total) by mouth 3 (three) times daily as needed. 90 tablet 0   No current facility-administered medications on file prior to visit.    Allergies  Allergen Reactions  . Simvastatin Other (See Comments)     pt states INTOL to [facility] w/ leg pains

## 2017-10-13 ENCOUNTER — Ambulatory Visit: Payer: Medicare Other | Admitting: Physician Assistant

## 2017-10-13 ENCOUNTER — Encounter: Payer: Self-pay | Admitting: Physician Assistant

## 2017-10-13 VITALS — BP 124/76 | HR 59 | Ht 71.0 in | Wt 178.0 lb

## 2017-10-13 DIAGNOSIS — I2510 Atherosclerotic heart disease of native coronary artery without angina pectoris: Secondary | ICD-10-CM | POA: Diagnosis not present

## 2017-10-13 DIAGNOSIS — Z01818 Encounter for other preprocedural examination: Secondary | ICD-10-CM | POA: Diagnosis not present

## 2017-10-13 DIAGNOSIS — I440 Atrioventricular block, first degree: Secondary | ICD-10-CM | POA: Diagnosis not present

## 2017-10-13 DIAGNOSIS — E782 Mixed hyperlipidemia: Secondary | ICD-10-CM

## 2017-10-13 DIAGNOSIS — I10 Essential (primary) hypertension: Secondary | ICD-10-CM | POA: Diagnosis not present

## 2017-10-13 MED ORDER — PRAVASTATIN SODIUM 80 MG PO TABS: 80.0000 mg | ORAL_TABLET | Freq: Every day | ORAL | 3 refills | Status: DC

## 2017-10-13 NOTE — Patient Instructions (Addendum)
Medication Instructions:  Your physician has recommended you make the following change in your medication:  1) INCREASE Pravastain to 80 mg by mouth ONCE daily with dinner   Labwork: Your physician recommends that you return for lab work in: 2 months - FASTING at your primary care office.   Testing/Procedures: none  Follow-Up: Your physician wants you to follow-up in: 6 months with Dr. Percival Spanish. You will receive a reminder letter in the mail two months in advance. If you don't receive a letter, please call our office to schedule the follow-up appointment.   Any Other Special Instructions Will Be Listed Below (If Applicable).  You are cleared for your surgery, we will fax a letter to surgeons office.  If you need a refill on your cardiac medications before your next appointment, please call your pharmacy.

## 2017-10-13 NOTE — Progress Notes (Signed)
Cardiology Office Note    Date:  10/14/2017   ID:  [name], DOB Feb 10, 1941, MRN [id number]  PCP:  Noralee Space, MD  Cardiologist:  Dr. Percival Spanish  Chief Complaint  Patient presents with  . Pre-op Exam    seen for Dr. Warren Lacy, preop clearance prior to L hip surgery by Dr. Wynelle Link    History of Present Illness:  [name] is a 77 y.o. male with PMH of HLD, HTN, prostate CA and h/o CAD.  Cardiac catheterization in 2007 demonstrated 95% mid LAD stenosis treated with bare-metal stent by Dr. Albertine Patricia, 20% proximal left circumflex stenosis, 20% mid RCA lesion, EF 60%.  He had a follow-up stress test in 2011 which was low risk.  His last office visit with Dr. Percival Spanish was in October 2015, at which time he was doing well.  He has a history of bronchitis and pulmonary nodule, this is being followed by Dr. Lenna Gilford.  Patient presents today for preoperative clearance prior to left hip surgery by Dr. Wynelle Link.  He denies any recent chest pain or significant shortness of breath.  He does have some mild shortness of breath with extreme exertion but no significant shortness of breath with regular activity.  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  He is able to climb up 2 flight of stairs and walk 4 blocks away from his house back without any issue.  I have discussed his case with Dr. Percival Spanish, no further workup is needed prior to the surgery.  He is cleared to proceed.  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  We will continue on the current medication.  He is not on a beta-blocker.  His recent lab work does show that his cholesterol is not very well controlled, I will increase his Pravachol to 80 mg daily, he will need of fasting lipid panel and LFT in 2 months at his PCPs office.  He can follow-up with Dr. Percival Spanish in 6 months.   Past Medical History:  Diagnosis Date    . Biceps muscle tear    right  . CAD (coronary artery disease)    anterior ischemia on a stress perfusion study.  (Catheterization in 2007, demonstrating 95% mid-LAD stenosis.  The circumflex has 20% proximal stenosis, the right coronary artery had 20% mid stenosis.  The EF was 60%.  He had a non drug eluting stent placed.    . Diverticulosis of colon (without mention of hemorrhage)   . Esophageal reflux   . Esophageal stricture   . Malignant neoplasm of prostate ([place])   . Osteoarthrosis, unspecified whether generalized or localized, unspecified site   . Osteoarthrosis, unspecified whether generalized or localized, unspecified site   . Other and unspecified hyperlipidemia   . Pulmonary nodule   . Recurrent aspiration bronchitis/pneumonia ([place])   . Unspecified essential hypertension     Past Surgical History:  Procedure Laterality Date  . DISTAL BICEPS TENDON REPAIR Right 03/12/2015   Procedure: REPAIR/RECONSTRUCTION RIGHT BICEPS TENDON;  Surgeon: Daryll Brod, MD;  Location: [facility];  Service: Orthopedics;  Laterality: Right;  . eyelid surgery for Lid Lag    . FOOT SURGERY    . HYDROCELE EXCISION / REPAIR    . INGUINAL HERNIA REPAIR     77 yrs old  . lung nodules    . MASTECTOMY  benign tumor  . Prostate treatment     cancer  . SKIN CANCER EXCISION  2012   skin cancer on right ear    Current Medications: Outpatient Medications Prior to Visit  Medication Sig Dispense Refill  . amLODipine (NORVASC) 5 MG tablet Take 1 tablet (5 mg total) by mouth daily. (Patient taking differently: Take 5 mg by mouth at bedtime. ) 30 tablet 11  . b complex vitamins tablet Take 1 tablet by mouth daily.    Marland Kitchen gabapentin (NEURONTIN) 100 MG capsule TAKE 1 CAPSULE BY MOUTH EVERY NIGHT AT BEDTIME FOR 3 WEEKS, THEN 2 CAPSULES BY MOUTH EVERY NIGHT AT BEDTIME THEREAFTER (Patient taking differently: TAKE 2 CAPSULEC BY MOUTH EVERY NIGHT AT BEDTIME.) 60 capsule 0  . GLUCOSAMINE SULFATE PO  Take 1 tablet by mouth daily.     Marland Kitchen lisinopril (PRINIVIL,ZESTRIL) 10 MG tablet TAKE 1 TABLET BY MOUTH EVERY DAY (Patient taking differently: TAKE 1 TABLET BY MOUTH EVERY DAY AT BEDTIME) 90 tablet 3  . LORazepam (ATIVAN) 1 MG tablet TAKE ONE-HALF TO 1 TABLET BY MOUTH THREE TIMES DAILY AS NEEDED FOR ANXIETY 90 tablet 5  . Magnesium 400 MG CAPS Take 400 mg by mouth daily.     . metFORMIN (GLUCOPHAGE XR) 500 MG 24 hr tablet Take 1 tablet (500 mg total) by mouth daily with breakfast. (Patient taking differently: Take 500 mg by mouth every evening. ) 30 tablet 11  . Multiple Vitamins-Minerals (MULTIVITAMIN PO) Take 1 tablet by mouth daily.    Marland Kitchen omeprazole (PRILOSEC) 40 MG capsule TAKE ONE CAPSULE BY MOUTH EVERY DAY 90 capsule 3  . traMADol (ULTRAM) 50 MG tablet Take 1 tablet (50 mg total) by mouth 3 (three) times daily as needed. (Patient taking differently: Take 50 mg by mouth 3 (three) times daily as needed (FOR PAIN.). ) 90 tablet 0  . aspirin 81 MG tablet Take 81 mg by mouth daily.    . Calcium-Magnesium-Vitamin D (CALCIUM 1200+D3 PO) Take 1 tablet by mouth daily.    . Cyanocobalamin (VITAMIN B-12 PO) Take 1 tablet by mouth daily.    . meloxicam (MOBIC) 15 MG tablet Take 15 mg by mouth daily.    . naproxen sodium (ANAPROX) 220 MG tablet Take 220 mg by mouth 2 (two) times daily with a meal.    . Omega-3 Fatty Acids (FISH OIL) 1000 MG CAPS Take 1,000 mg by mouth daily.    Marland Kitchen POTASSIUM CHLORIDE PO Take by mouth daily.    . pravastatin (PRAVACHOL) 20 MG tablet Take 1 tablet (20 mg total) by mouth daily. 30 tablet 11  . atenolol (TENORMIN) 25 MG tablet TAKE 1 TABLET BY MOUTH EVERY DAY (Patient not taking: Reported on 09/18/2017) 30 tablet 3   No facility-administered medications prior to visit.      Allergies:   Simvastatin   Social History   Socioeconomic History  . Marital status: Married    Spouse name: Not on file  . Number of children: 1  . Years of education: Not on file  . Highest  education level: Not on file  Occupational History  . Occupation: Psychologist, occupational  Social Needs  . Financial resource strain: Not on file  . Food insecurity:    Worry: Not on file    Inability: Not on file  . Transportation needs:    Medical: Not on file    Non-medical: Not on file  Tobacco Use  . Smoking status: Former Smoker    Packs/day: 1.00  Years: 18.00    Pack years: 18.00    Types: Cigarettes    Last attempt to quit: 04/24/1977    Years since quitting: 40.5  . Smokeless tobacco: Former Systems developer    Types: Snuff, Chew  Substance and Sexual Activity  . Alcohol use: Yes    Alcohol/week: 7.2 oz    Types: 12 Cans of beer per week    Comment: 1-2 beers a day  . Drug use: No  . Sexual activity: Not on file  Lifestyle  . Physical activity:    Days per week: Not on file    Minutes per session: Not on file  . Stress: Not on file  Relationships  . Social connections:    Talks on phone: Not on file    Gets together: Not on file    Attends religious service: Not on file    Active member of club or organization: Not on file    Attends meetings of clubs or organizations: Not on file    Relationship status: Not on file  Other Topics Concern  . Not on file  Social History Narrative  . Not on file     Family History:  The patient's family history includes Prostate cancer in his brother and father.   ROS:   Please see the history of present illness.    ROS All other systems reviewed and are negative.   PHYSICAL EXAM:   VS:  BP 124/76   Pulse (!) 59   Ht 5\' 11"  (1.803 m)   Wt 178 lb (80.7 kg)   BMI 24.83 kg/m    GEN: Well nourished, well developed, in no acute distress  HEENT: normal  Neck: no JVD, carotid bruits, or masses Cardiac: RRR; no murmurs, rubs, or gallops,no edema  Respiratory:  clear to auscultation bilaterally, normal work of breathing GI: soft, nontender, nondistended, + BS MS: no deformity or atrophy  Skin: warm and dry, no rash Neuro:  Alert and  Oriented x 3, Strength and sensation are intact Psych: euthymic mood, full affect  Wt Readings from Last 3 Encounters:  10/13/17 178 lb (80.7 kg)  09/18/17 182 lb (82.6 kg)  06/29/17 180 lb 3.2 oz (81.7 kg)      Studies/Labs Reviewed:   EKG:  EKG is ordered today.  The ekg ordered today demonstrates normal sinus rhythm with first-degree AV block, single PVC  Recent Labs: 09/19/2017: ALT 17; BUN 23; Creatinine, Ser 0.80; Hemoglobin 13.7; Platelets 193.0; Potassium 5.2; Sodium 140; TSH 2.09   Lipid Panel    Component Value Date/Time   CHOL 171 09/19/2017 0821   TRIG 189.0 (H) 09/19/2017 0821   TRIG 157 (H) 06/26/2006 0752   HDL 40.20 09/19/2017 0821   CHOLHDL 4 09/19/2017 0821   VLDL 37.8 09/19/2017 0821   LDLCALC 93 09/19/2017 0821   LDLDIRECT 96.0 06/29/2016 1013    Additional studies/ records that were reviewed today include:   Cath 02/28/2006       ASSESSMENT:    1. Preoperative clearance   2. Mixed hyperlipidemia   3. Essential hypertension   4. Coronary artery disease involving native coronary artery of native heart without angina pectoris   5. 1st degree AV block      PLAN:  In order of problems listed above:  1. Preoperative clearance: Patient denies any recent anginal symptom, he is able to pick up 50 lbs of goat feed without exertional symptoms.  I have discussed the case with Dr. Percival Spanish, no further workup  is needed prior to upcoming left hip surgery.  Unless absolutely necessary, I would prefer him to continue on aspirin through the surgery.  2. CAD: History of BMS to mid LAD in 2007.  Continue aspirin.  3. Hypertension: Blood pressure stable on current medication  4. Hyperlipidemia: Increase Pravachol to 80 mg daily, fasting lipid panel and LFT in 6-8 weeks.  5. First-degree AV block: No AV nodal blocking agent.    Medication Adjustments/Labs and Tests Ordered: Current medicines are reviewed at length with the patient today.  Concerns  regarding medicines are outlined above.  Medication changes, Labs and Tests ordered today are listed in the Patient Instructions below. Patient Instructions  Medication Instructions:  Your physician has recommended you make the following change in your medication:  1) INCREASE Pravastain to 80 mg by mouth ONCE daily with dinner   Labwork: Your physician recommends that you return for lab work in: 2 months - FASTING at your primary care office.   Testing/Procedures: none  Follow-Up: Your physician wants you to follow-up in: 6 months with Dr. Percival Spanish. You will receive a reminder letter in the mail two months in advance. If you don't receive a letter, please call our office to schedule the follow-up appointment.   Any Other Special Instructions Will Be Listed Below (If Applicable).  You are cleared for your surgery, we will fax a letter to surgeons office.  If you need a refill on your cardiac medications before your next appointment, please call your pharmacy.      Hilbert Corrigan, [place]  10/14/2017 9:49 AM    [address], [place],   [address] Phone: [phone]; Fax: [phone]

## 2017-10-14 ENCOUNTER — Encounter: Payer: Self-pay | Admitting: Physician Assistant

## 2017-10-16 NOTE — Progress Notes (Signed)
Need orders in epic.  Surgery on 4/8.  Preop on 10/19/2017.

## 2017-10-17 ENCOUNTER — Encounter (HOSPITAL_COMMUNITY): Payer: Self-pay

## 2017-10-17 NOTE — Patient Instructions (Signed)
Your procedure is scheduled on: Monday, October 23, 2017   Surgery Time:  2:50PM-4:20PM   Report to [facility]  Entrance    Report to admitting at 12:20 PM   Call this number if you have problems the morning of surgery [phone]   Do not eat food:After Midnight.   Do NOT smoke after Midnight   May have liquids until 8:30AM day of surgery    CLEAR LIQUID DIET   Foods Allowed                                                                     Foods Excluded  Coffee and tea, regular and decaf                             liquids that you cannot  Plain Jell-O in any flavor                                             see through such as: Fruit ices (not with fruit pulp)                                     milk, soups, orange juice  Iced Popsicles                                    All solid food Carbonated beverages, regular and diet                                    Cranberry, grape and apple juices Sports drinks like Gatorade Lightly seasoned clear broth or consume(fat free) Sugar, honey syrup  Sample Menu Breakfast                                Lunch                                     Supper Cranberry juice                    Beef broth                            Chicken broth Jell-O                                     Grape juice                           Apple juice Coffee or tea  Jell-O                                      Popsicle                                                Coffee or tea                        Coffee or tea   Take these medicines the morning of surgery with A SIP OF WATER: Omeprazole, Xanax if needed                  You may not have any metal on your body including jewelry, and body piercings             Do not wear lotions, powders, perfumes/cologne, or deodorant                         Men may shave face and neck.   Do not bring valuables to the hospital. [facility].   Contacts, dentures or bridgework may not be worn into surgery.   Leave suitcase in the car. After surgery it may be brought to your room.   Special Instructions: Bring a copy of your healthcare power of attorney and living will documents         the day of surgery if you haven't scanned them in before.              Please read over the following fact sheets you were given:  [facility] - Preparing for Surgery Before surgery, you can play an important role.  Because skin is not sterile, your skin needs to be as free of germs as possible.  You can reduce the number of germs on your skin by washing with CHG (chlorahexidine gluconate) soap before surgery.  CHG is an antiseptic cleaner which kills germs and bonds with the skin to continue killing germs even after washing. Please DO NOT use if you have an allergy to CHG or antibacterial soaps.  If your skin becomes reddened/irritated stop using the CHG and inform your nurse when you arrive at Short Stay. Do not shave (including legs and underarms) for at least 48 hours prior to the first CHG shower.  You may shave your face/neck.  Please follow these instructions carefully:  1.  Shower with CHG Soap the night before surgery and the  morning of surgery.  2.  If you choose to wash your hair, wash your hair first as usual with your normal  shampoo.  3.  After you shampoo, rinse your hair and body thoroughly to remove the shampoo.                             4.  Use CHG as you would any other liquid soap.  You can apply chg directly to the skin and wash.  Gently with a scrungie or clean washcloth.  5.  Apply the CHG Soap to your body ONLY FROM THE NECK DOWN.   Do  not use on face/ open                           Wound or open sores. Avoid contact with eyes, ears mouth and   genitals (private parts).                       Wash face,  Genitals (private parts) with your normal soap.             6.  Wash thoroughly, paying special attention  to the area where your    surgery  will be performed.  7.  Thoroughly rinse your body with warm water from the neck down.  8.  DO NOT shower/wash with your normal soap after using and rinsing off the CHG Soap.                9.  Pat yourself dry with a clean towel.            10.  Wear clean pajamas.            11.  Place clean sheets on your bed the night of your first shower and do not  sleep with pets. Day of Surgery : Do not apply any lotions/deodorants the morning of surgery.  Please wear clean clothes to the hospital/surgery center.  FAILURE TO FOLLOW THESE INSTRUCTIONS MAY RESULT IN THE CANCELLATION OF YOUR SURGERY  PATIENT SIGNATURE_________________________________  NURSE SIGNATURE__________________________________  ________________________________________________________________________     Adam Phenix  An incentive spirometer is a tool that can help keep your lungs clear and active. This tool measures how well you are filling your lungs with each breath. Taking long deep breaths may help reverse or decrease the chance of developing breathing (pulmonary) problems (especially infection) following:  A long period of time when you are unable to move or be active. BEFORE THE PROCEDURE   If the spirometer includes an indicator to show your best effort, your nurse or respiratory therapist will set it to a desired goal.  If possible, sit up straight or lean slightly forward. Try not to slouch.  Hold the incentive spirometer in an upright position. INSTRUCTIONS FOR USE  1. Sit on the edge of your bed if possible, or sit up as far as you can in bed or on a chair. 2. Hold the incentive spirometer in an upright position. 3. Breathe out normally. 4. Place the mouthpiece in your mouth and seal your lips tightly around it. 5. Breathe in slowly and as deeply as possible, raising the piston or the ball toward the top of the column. 6. Hold your breath for 3-5 seconds or for as  long as possible. Allow the piston or ball to fall to the bottom of the column. 7. Remove the mouthpiece from your mouth and breathe out normally. 8. Rest for a few seconds and repeat Steps 1 through 7 at least 10 times every 1-2 hours when you are awake. Take your time and take a few normal breaths between deep breaths. 9. The spirometer may include an indicator to show your best effort. Use the indicator as a goal to work toward during each repetition. 10. After each set of 10 deep breaths, practice coughing to be sure your lungs are clear. If you have an incision (the cut made at the time of surgery), support your incision when coughing by placing a pillow or rolled up towels firmly against it. Once  you are able to get out of bed, walk around indoors and cough well. You may stop using the incentive spirometer when instructed by your caregiver.  RISKS AND COMPLICATIONS  Take your time so you do not get dizzy or light-headed.  If you are in pain, you may need to take or ask for pain medication before doing incentive spirometry. It is harder to take a deep breath if you are having pain. AFTER USE  Rest and breathe slowly and easily.  It can be helpful to keep track of a log of your progress. Your caregiver can provide you with a simple table to help with this. If you are using the spirometer at home, follow these instructions: [place] IF:   You are having difficultly using the spirometer.  You have trouble using the spirometer as often as instructed.  Your pain medication is not giving enough relief while using the spirometer.  You develop fever of 100.5 F (38.1 C) or higher. SEEK IMMEDIATE MEDICAL CARE IF:   You cough up bloody sputum that had not been present before.  You develop fever of 102 F (38.9 C) or greater.  You develop worsening pain at or near the incision site. MAKE SURE YOU:   Understand these instructions.  Will watch your condition.  Will get help  right away if you are not doing well or get worse. Document Released: 11/14/2006 Document Revised: 09/26/2011 Document Reviewed: 01/15/2007 ExitCare Patient Information 2014 ExitCare, [place].   ________________________________________________________________________  WHAT IS A BLOOD TRANSFUSION? Blood Transfusion Information  A transfusion is the replacement of blood or some of its parts. Blood is made up of multiple cells which provide different functions.  Red blood cells carry oxygen and are used for blood loss replacement.  White blood cells fight against infection.  Platelets control bleeding.  Plasma helps clot blood.  Other blood products are available for specialized needs, such as hemophilia or other clotting disorders. BEFORE THE TRANSFUSION  Who gives blood for transfusions?   Healthy volunteers who are fully evaluated to make sure their blood is safe. This is blood bank blood. Transfusion therapy is the safest it has ever been in the practice of medicine. Before blood is taken from a donor, a complete history is taken to make sure that person has no history of diseases nor engages in risky social behavior (examples are intravenous drug use or sexual activity with multiple partners). The donor's travel history is screened to minimize risk of transmitting infections, such as malaria. The donated blood is tested for signs of infectious diseases, such as HIV and hepatitis. The blood is then tested to be sure it is compatible with you in order to minimize the chance of a transfusion reaction. If you or a relative donates blood, this is often done in anticipation of surgery and is not appropriate for emergency situations. It takes many days to process the donated blood. RISKS AND COMPLICATIONS Although transfusion therapy is very safe and saves many lives, the main dangers of transfusion include:   Getting an infectious disease.  Developing a transfusion reaction. This is an  allergic reaction to something in the blood you were given. Every precaution is taken to prevent this. The decision to have a blood transfusion has been considered carefully by your caregiver before blood is given. Blood is not given unless the benefits outweigh the risks. AFTER THE TRANSFUSION  Right after receiving a blood transfusion, you will usually feel much better and more energetic. This  is especially true if your red blood cells have gotten low (anemic). The transfusion raises the level of the red blood cells which carry oxygen, and this usually causes an energy increase.  The nurse administering the transfusion will monitor you carefully for complications. HOME CARE INSTRUCTIONS  No special instructions are needed after a transfusion. You may find your energy is better. Speak with your caregiver about any limitations on activity for underlying diseases you may have. SEEK MEDICAL CARE IF:   Your condition is not improving after your transfusion.  You develop redness or irritation at the intravenous (IV) site. SEEK IMMEDIATE MEDICAL CARE IF:  Any of the following symptoms occur over the next 12 hours:  Shaking chills.  You have a temperature by mouth above 102 F (38.9 C), not controlled by medicine.  Chest, back, or muscle pain.  People around you feel you are not acting correctly or are confused.  Shortness of breath or difficulty breathing.  Dizziness and fainting.  You get a rash or develop hives.  You have a decrease in urine output.  Your urine turns a dark color or changes to pink, red, or brown. Any of the following symptoms occur over the next 10 days:  You have a temperature by mouth above 102 F (38.9 C), not controlled by medicine.  Shortness of breath.  Weakness after normal activity.  The white part of the eye turns yellow (jaundice).  You have a decrease in the amount of urine or are urinating less often.  Your urine turns a dark color or changes  to pink, red, or brown. Document Released: 07/01/2000 Document Revised: 09/26/2011 Document Reviewed: 02/18/2008 [facility] Patient Information 2014 [place], [place].  _______________________________________________________________________

## 2017-10-17 NOTE — Pre-Procedure Instructions (Signed)
The following are in epic: Cardiac clearance and last office visit note [facility] 10/13/2017 EKG 10/13/2017 Hgb A1C (6.6) 09/19/2017 CXR 06/29/2017

## 2017-10-19 ENCOUNTER — Encounter (HOSPITAL_COMMUNITY): Payer: Self-pay

## 2017-10-19 ENCOUNTER — Other Ambulatory Visit: Payer: Self-pay

## 2017-10-19 ENCOUNTER — Ambulatory Visit: Payer: Self-pay | Admitting: Orthopedic Surgery

## 2017-10-19 ENCOUNTER — Encounter (HOSPITAL_COMMUNITY)
Admission: RE | Admit: 2017-10-19 | Discharge: 2017-10-19 | Disposition: A | Discharge status: Home / Self Care | Payer: Medicare Other | Source: Ambulatory Visit | Attending: Orthopedic Surgery | Admitting: Orthopedic Surgery

## 2017-10-19 DIAGNOSIS — Z7901 Long term (current) use of anticoagulants: Secondary | ICD-10-CM | POA: Insufficient documentation

## 2017-10-19 DIAGNOSIS — Z01818 Encounter for other preprocedural examination: Secondary | ICD-10-CM | POA: Insufficient documentation

## 2017-10-19 DIAGNOSIS — Z7984 Long term (current) use of oral hypoglycemic drugs: Secondary | ICD-10-CM | POA: Diagnosis not present

## 2017-10-19 DIAGNOSIS — M1612 Unilateral primary osteoarthritis, left hip: Secondary | ICD-10-CM | POA: Diagnosis not present

## 2017-10-19 DIAGNOSIS — Z79899 Other long term (current) drug therapy: Secondary | ICD-10-CM | POA: Insufficient documentation

## 2017-10-19 HISTORY — DX: Personal history of other diseases of the respiratory system: Z87.09

## 2017-10-19 HISTORY — DX: Flushing: R23.2

## 2017-10-19 HISTORY — DX: Dyspnea, unspecified: R06.00

## 2017-10-19 HISTORY — DX: Unspecified malignant neoplasm of skin, unspecified: C44.90

## 2017-10-19 HISTORY — DX: Atherosclerosis of aorta: I70.0

## 2017-10-19 HISTORY — DX: Type 2 diabetes mellitus without complications: E11.9

## 2017-10-19 HISTORY — DX: Spinal stenosis, site unspecified: M48.00

## 2017-10-19 HISTORY — DX: Hyperlipidemia, unspecified: E78.5

## 2017-10-19 HISTORY — DX: Atrioventricular block, first degree: I44.0

## 2017-10-19 LAB — BASIC METABOLIC PANEL
Anion gap: 8 (ref 5–15)
BUN: 18 mg/dL (ref 6–20)
CHLORIDE: 104 mmol/L (ref 101–111)
CO2: 26 mmol/L (ref 22–32)
Calcium: 9.3 mg/dL (ref 8.9–10.3)
Creatinine, Ser: 0.74 mg/dL (ref 0.61–1.24)
GFR calc Af Amer: >60 mL/min (ref >60)
GFR calc non Af Amer: >60 mL/min (ref >60)
GLUCOSE: 126 mg/dL — AB (ref 65–99)
POTASSIUM: 4.8 mmol/L (ref 3.5–5.1)
Sodium: 138 mmol/L (ref 135–145)

## 2017-10-19 LAB — CBC
HEMATOCRIT: 37.4 % — AB (ref 39.0–52.0)
HEMOGLOBIN: 12.8 g/dL — AB (ref 13.0–17.0)
MCH: 32 pg (ref 26.0–34.0)
MCHC: 34.2 g/dL (ref 30.0–36.0)
MCV: 93.5 fL (ref 78.0–100.0)
Platelets: 178 10*3/uL (ref 150–400)
RBC: 4 MIL/uL — ABNORMAL LOW (ref 4.22–5.81)
RDW: 13 % (ref 11.5–15.5)
WBC: 5.7 10*3/uL (ref 4.0–10.5)

## 2017-10-19 LAB — GLUCOSE, CAPILLARY: Glucose-Capillary: 103 mg/dL — ABNORMAL HIGH (ref 65–99)

## 2017-10-19 LAB — ABO/RH: ABO/RH(D): O POS

## 2017-10-19 NOTE — Pre-Procedure Instructions (Signed)
CBC and BMP results 10/19/2017 faxed to Dr. Wynelle Link.

## 2017-10-20 ENCOUNTER — Other Ambulatory Visit: Payer: Self-pay | Admitting: Pulmonary Disease

## 2017-10-20 LAB — SURGICAL PCR SCREEN
MRSA, PCR: POSITIVE — AB
STAPHYLOCOCCUS AUREUS: POSITIVE — AB

## 2017-10-20 NOTE — Pre-Procedure Instructions (Signed)
PCR results 10/19/2017 faxed to Dr. Wynelle Link via epic.

## 2017-10-22 ENCOUNTER — Ambulatory Visit: Payer: Self-pay | Admitting: Orthopedic Surgery

## 2017-10-22 NOTE — H&P (View-Only) (Signed)
ELON, EOFF ([id number], M)  DOB 08/19/1940    Chief Complaint Left Hip Pain H&P for Left THA by Dr. Wynelle Link on 10/23/2017  Patient's Care Team Referring Provider: Terrilee Files MD: [address], [place], [place] [address], Ph [phone], Fax [phone] NPI: [id number]  Primary Care Provider: Teressa Lower MD: [address] [place], [place] [address], Ph [phone], Fax [phone] NPI: [id number]  Patient's Pharmacies [place] [address] [facility]): [address], [place] [address], Ph [phone], Fax [phone]   Vitals Ht: 5 ft 10 in  Wt: 179 lbs BMI: 25.7 BP: 124/72 sitting L arm  Pulse: 72 bpm regular    Allergies Reviewed Allergies NKDA   Medications Reviewed Medications ALPRAZolam 0.5 mg tablet TAKE 1/2 TABLET (0.5 MG) BY ORAL Daily 10/20/17   entered ALEXZANDREW PERKINS, PA-C amLODIPine 5 mg tablet 10/08/17   filled PRIME B Complex 10/22/17   entered ALEXZANDREW PERKINS, PA-C calcium 10/22/17   entered ALEXZANDREW PERKINS, PA-C Fish Oil 10/22/17   entered ALEXZANDREW PERKINS, PA-C gabapentin 100 mg capsule Take 2 capsule(s) every day by oral route. 10/20/17   entered ALEXZANDREW PERKINS, PA-C Glucosamine Chondroitin 10/22/17   entered ALEXZANDREW PERKINS, PA-C lisinopril 10 mg tablet TK 1 T PO QD 08/11/17   filled PRIME LORazepam 1 mg tablet TAKE 1/2 TO 1 TABLET TID PRA 07/29/17   filled surescripts magnesium 10/22/17   entered ALEXZANDREW PERKINS, PA-C metFORMIN ER 500 mg tablet,extended release 24 hr 09/23/17   filled PRIME Multiple Vitamins 10/22/17   entered ALEXZANDREW PERKINS, PA-C omeprazole 40 mg capsule,delayed release TK ONE C PO QD 08/11/17   filled PRIME potassium 10/22/17   entered ALEXZANDREW PERKINS, PA-C pravastatin 80 mg tablet 10/13/17   filled PRIME traMADol 50 mg tablet TK 1 T PO TID PRN 09/29/17   filled PRIME Tylenol 500 mg capsule Take 2 tablet(s) by oral route. 09/15/17   entered Lorriane Shire  Cumine            Problems Reviewed Problems Osteoarthritis of left hip joint    Family History Reviewed Family History Mother - Hypertensive disorder   - Heart disease   - Mother deceased   - Alzheimer's disease Father - Father deceased   - Malignant tumor of lung   Social History Reviewed Social History Smoking Status: Former smoker Tobacco-years of use: 12 Chewing tobacco: none Alcohol intake: Moderate Hand Dominance: Left Work related injury?: N Advance directive: Y Medical Power of Attorney: Y   Surgical History Reviewed Surgical History Bunionectomy Cataract - 07/18/2017 Vasectomy - 07/18/1982   Past Medical History Reviewed Past Medical History Cancer: Y Diabetes: Y GERD/Reflux: Y Heart Problems: Y High Cholesterol: Y Hypertension: Y Joint Pain: Y Previous Cortisone Injection(s): Y Previous Fracture(s): Y Weight loss: Y Notes: CAD,  Heart Stents,  Prostate Cancer (Radiation Treatment),  Hemorrhoids   HPI The patient is here today for a pre-operative History and Physical. They are scheduled for left total hip replacement on 10/23/17 with Dr. Wynelle Link at [facility]. Mr. Chappuis was seen for evaluation of his left hip. He was referred over by Dr. Brien Few for evaluation of left hip arthritis. Dr. Brien Few has been seeing the patient treating for back issues he has had a epidural steroid injection which eliminated his back pain unfortunately the pain in the hip region has been ongoing. He has had x-rays and told he had bad arthritis and was recommended to come see Dr. Wynelle Link. The hip issues have been  going on for over a year and a half and progressively getting worse with time. He states he cannot sit for any length of time which causes discomfort. Laying laying in bed with that leg out straight is about the only position of comfort he can find. It hurts to turn over in bed and there is pain at night. He does have pain with standing and walking. He feels  like his loss of motion with pain and discomfort and difficulty getting on socks on that left foot. There is also some pain noted in getting in and out of car and going up and down steps. There is not only palpable but audible popping and clicking in that hip and it did want to try to give away once in the past.  He did have a history of prostate cancer back in 2007 but underwent radiation and has been told he had a clean bill of health with regards to his prostate cancer. He states he does have some heart disease and has one coronary stent. His medical doctor is Dr. Lenna Gilford and his cardiologist is Dr. Percival Spanish. AP pelvis, AP and lateral of the left hip dated 09/15/2017 demonstrates bone-on-bone arthritis with spurring in the joint, as well as arthritic cyst formation. Mr. Abdulaziz was found to have significant pain and dysfunction in their hip. At this point the most predictable means of improving pain and function is total hip arthroplasty. We discussed the procedure risks, potential complication and rehab course in detail. At this time the patient would like to go ahead and proceed with total hip arthroplasty.   ROS Constitutional: Constitutional: no significant weight gain or loss and no fever.  HEENT: Eyes: Color Blindness noted.  Cardiovascular: Cardiovascular: no palpitations or chest pain; Does have one heart.  Respiratory: Respiratory: no cough or shortness of breath and No COPD.  Gastrointestinal: Gastrointestinal: no vomiting or diarrhea and not vomiting blood; some heartburn.  Genitourinary: Genitourinary: no blood in urine or difficulty urinating.  Musculoskeletal: Musculoskeletal: Joint Pain; hip pain.  Neurologic: Neurologic: numbness; some tingling, decreased memory.  Hematologic/Lymphatic: Hematologic/Lymphatic easy bruising.   Physical Exam Patient is a 77 year old male.  General Mental Status - Alert, cooperative and good historian. General Appearance - pleasant, Not in  acute distress. Orientation - Oriented X3. Build & Nutrition - Well nourished and Well developed.  Head and Neck Head - normocephalic, atraumatic . Neck Global Assessment - supple, no bruit auscultated on the right, no bruit auscultated on the left.  Eye Pupil - Bilateral - PERR Motion - Bilateral - EOMI.  Chest and Lung Exam Auscultation Breath sounds - clear at anterior chest wall and clear at posterior chest wall. Adventitious sounds - No Adventitious sounds.  Cardiovascular Auscultation Rhythm - Regular rate and rhythm with only on skipped beat during auscultaion.=. Heart Sounds - S1 WNL and S2 WNL. Murmurs & Other Heart Sounds - Auscultation of the heart reveals - Faint systolic murmur only over the aortic area.  Abdomen Palpation/Percussion Tenderness - Abdomen is non-tender to palpation. Abdomen is soft. Auscultation Auscultation of the abdomen reveals - Bowel sounds normal.  Male Genitourinary Note: Not done, not pertinent to present illness  Musculoskeletal Right Hip Exam: ROM: Range of motion normal without discomfort.  There is no tenderness over the greater trochanter.  There is no pain on provocative testing of the hip.  Left Hip Exam: ROM: Flexion to 90, Rotation and abduction decreased and limited by discomfort.  There is no tenderness over the greater trochanter.  Right Knee Exam:  No effusion.  Range of motion is 0-135 degrees.  There is no crepitus on range of motion of the knee.  There is no medial or lateral joint line tenderness.  There is no instability noted. Strong pulses.  Left Knee Exam:  No effusion.  Range of motion is 0-135 degrees.  There is no crepitus on range of motion of the knee.  There is no medial or lateral joint line tenderness.  There is no instability noted. Strong pulses.  Radiographs- AP pelvis, AP and lateral of the right hip dated 09/15/2017 demonstrates a normal hip.   Radiographs- AP pelvis, AP and lateral of  the left hip dated 09/15/2017 demonstrates bone-on-bone arthritis with spurring in the joint, as well as arthritic cyst formation.   Assessment / Plan 1. Osteoarthritis of left hip joint M16.12: Unilateral primary osteoarthritis, left hip  Goals Patient Instructions Surgical Plans: Left Total Hip Replacement - Anterior Approach Disposition: Home, HHPT  PCP: Dr. Lenna Gilford Cards: Dr. Percival Spanish Topical TXA Anesthesia Issues: None Patient was instructed on what medications to stop prior to surgery. - Follow up visit in 2 weeks with Dr. Wynelle Link - Begin physical therapy following surgery - Pre-operative lab work as pre Pre-Surgical Testing - Prescriptions will be provided in hospital at time of discharge  Return to [place], MD for [place] at [facility] on 11/07/2017 at 03:15 PM  Encounter signed-off by Mickel Crow, PA-C

## 2017-10-22 NOTE — H&P (Signed)
KHIZAR, FIORELLA (30JJ, M)  DOB 05-Apr-1941    Chief Complaint Left Hip Pain H&P for Left THA by Dr. Wynelle Link on 10/23/2017  Patient's Care Team Referring Provider: Terrilee Files MD: [address], [place], [place] [address], Ph [phone], Fax [phone] NPI: [id number]  Primary Care Provider: Teressa Lower MD: [address] [place], [place] [address], Ph [phone], Fax [phone] NPI: [id number]  Patient's Pharmacies [place] [address] [facility]): [address], [place] [address], Ph [phone], Fax [phone]   Vitals Ht: 5 ft 10 in  Wt: 179 lbs BMI: 25.7 BP: 124/72 sitting L arm  Pulse: 72 bpm regular    Allergies Reviewed Allergies NKDA   Medications Reviewed Medications ALPRAZolam 0.5 mg tablet TAKE 1/2 TABLET (0.5 MG) BY ORAL Daily 10/20/17   entered Esther Bradstreet, PA-C amLODIPine 5 mg tablet 10/08/17   filled PRIME B Complex 10/22/17   entered Eryanna Regal, PA-C calcium 10/22/17   entered Fitzgerald Dunne, PA-C Fish Oil 10/22/17   entered Johany Hansman, PA-C gabapentin 100 mg capsule Take 2 capsule(s) every day by oral route. 10/20/17   entered Tejay Hubert, PA-C Glucosamine Chondroitin 10/22/17   entered Maryelizabeth Eberle, PA-C lisinopril 10 mg tablet TK 1 T PO QD 08/11/17   filled PRIME LORazepam 1 mg tablet TAKE 1/2 TO 1 TABLET TID PRA 07/29/17   filled surescripts magnesium 10/22/17   entered Rachele Lamaster, PA-C metFORMIN ER 500 mg tablet,extended release 24 hr 09/23/17   filled PRIME Multiple Vitamins 10/22/17   entered Jaymarion Trombly, PA-C omeprazole 40 mg capsule,delayed release TK ONE C PO QD 08/11/17   filled PRIME potassium 10/22/17   entered Enis Leatherwood, PA-C pravastatin 80 mg tablet 10/13/17   filled PRIME traMADol 50 mg tablet TK 1 T PO TID PRN 09/29/17   filled PRIME Tylenol 500 mg capsule Take 2 tablet(s) by oral route. 09/15/17   entered Lorriane Shire  Cumine            Problems Reviewed Problems Osteoarthritis of left hip joint    Family History Reviewed Family History Mother - Hypertensive disorder   - Heart disease   - Mother deceased   - Alzheimer's disease Father - Father deceased   - Malignant tumor of lung   Social History Reviewed Social History Smoking Status: Former smoker Tobacco-years of use: 12 Chewing tobacco: none Alcohol intake: Moderate Hand Dominance: Left Work related injury?: N Advance directive: Y Medical Power of Attorney: Y   Surgical History Reviewed Surgical History Bunionectomy Cataract - 07/18/2017 Vasectomy - 07/18/1982   Past Medical History Reviewed Past Medical History Cancer: Y Diabetes: Y GERD/Reflux: Y Heart Problems: Y High Cholesterol: Y Hypertension: Y Joint Pain: Y Previous Cortisone Injection(s): Y Previous Fracture(s): Y Weight loss: Y Notes: CAD,  Heart Stents,  Prostate Cancer (Radiation Treatment),  Hemorrhoids   HPI The patient is here today for a pre-operative History and Physical. They are scheduled for left total hip replacement on 10/23/17 with Dr. Wynelle Link at [facility]. Mr. Gullett was seen for evaluation of his left hip. He was referred over by Dr. Brien Few for evaluation of left hip arthritis. Dr. Brien Few has been seeing the patient treating for back issues he has had a epidural steroid injection which eliminated his back pain unfortunately the pain in the hip region has been ongoing. He has had x-rays and told he had bad arthritis and was recommended to come see Dr. Wynelle Link. The hip issues have been  going on for over a year and a half and progressively getting worse with time. He states he cannot sit for any length of time which causes discomfort. Laying laying in bed with that leg out straight is about the only position of comfort he can find. It hurts to turn over in bed and there is pain at night. He does have pain with standing and walking. He feels  like his loss of motion with pain and discomfort and difficulty getting on socks on that left foot. There is also some pain noted in getting in and out of car and going up and down steps. There is not only palpable but audible popping and clicking in that hip and it did want to try to give away once in the past.  He did have a history of prostate cancer back in 2007 but underwent radiation and has been told he had a clean bill of health with regards to his prostate cancer. He states he does have some heart disease and has one coronary stent. His medical doctor is Dr. Lenna Gilford and his cardiologist is Dr. Percival Spanish. AP pelvis, AP and lateral of the left hip dated 09/15/2017 demonstrates bone-on-bone arthritis with spurring in the joint, as well as arthritic cyst formation. Mr. Brodzinski was found to have significant pain and dysfunction in their hip. At this point the most predictable means of improving pain and function is total hip arthroplasty. We discussed the procedure risks, potential complication and rehab course in detail. At this time the patient would like to go ahead and proceed with total hip arthroplasty.   ROS Constitutional: Constitutional: no significant weight gain or loss and no fever.  HEENT: Eyes: Color Blindness noted.  Cardiovascular: Cardiovascular: no palpitations or chest pain; Does have one heart.  Respiratory: Respiratory: no cough or shortness of breath and No COPD.  Gastrointestinal: Gastrointestinal: no vomiting or diarrhea and not vomiting blood; some heartburn.  Genitourinary: Genitourinary: no blood in urine or difficulty urinating.  Musculoskeletal: Musculoskeletal: Joint Pain; hip pain.  Neurologic: Neurologic: numbness; some tingling, decreased memory.  Hematologic/Lymphatic: Hematologic/Lymphatic easy bruising.   Physical Exam Patient is a 77 year old male.  General Mental Status - Alert, cooperative and good historian. General Appearance - pleasant, Not in  acute distress. Orientation - Oriented X3. Build & Nutrition - Well nourished and Well developed.  Head and Neck Head - normocephalic, atraumatic . Neck Global Assessment - supple, no bruit auscultated on the right, no bruit auscultated on the left.  Eye Pupil - Bilateral - PERR Motion - Bilateral - EOMI.  Chest and Lung Exam Auscultation Breath sounds - clear at anterior chest wall and clear at posterior chest wall. Adventitious sounds - No Adventitious sounds.  Cardiovascular Auscultation Rhythm - Regular rate and rhythm with only on skipped beat during auscultaion.=. Heart Sounds - S1 WNL and S2 WNL. Murmurs & Other Heart Sounds - Auscultation of the heart reveals - Faint systolic murmur only over the aortic area.  Abdomen Palpation/Percussion Tenderness - Abdomen is non-tender to palpation. Abdomen is soft. Auscultation Auscultation of the abdomen reveals - Bowel sounds normal.  Male Genitourinary Note: Not done, not pertinent to present illness  Musculoskeletal Right Hip Exam: ROM: Range of motion normal without discomfort.  There is no tenderness over the greater trochanter.  There is no pain on provocative testing of the hip.  Left Hip Exam: ROM: Flexion to 90, Rotation and abduction decreased and limited by discomfort.  There is no tenderness over the greater trochanter.  Right Knee Exam:  No effusion.  Range of motion is 0-135 degrees.  There is no crepitus on range of motion of the knee.  There is no medial or lateral joint line tenderness.  There is no instability noted. Strong pulses.  Left Knee Exam:  No effusion.  Range of motion is 0-135 degrees.  There is no crepitus on range of motion of the knee.  There is no medial or lateral joint line tenderness.  There is no instability noted. Strong pulses.  Radiographs- AP pelvis, AP and lateral of the right hip dated 09/15/2017 demonstrates a normal hip.   Radiographs- AP pelvis, AP and lateral of  the left hip dated 09/15/2017 demonstrates bone-on-bone arthritis with spurring in the joint, as well as arthritic cyst formation.   Assessment / Plan 1. Osteoarthritis of left hip joint M16.12: Unilateral primary osteoarthritis, left hip  Goals Patient Instructions Surgical Plans: Left Total Hip Replacement - Anterior Approach Disposition: Home, HHPT  PCP: Dr. Lenna Gilford Cards: Dr. Percival Spanish Topical TXA Anesthesia Issues: None Patient was instructed on what medications to stop prior to surgery. - Follow up visit in 2 weeks with Dr. Wynelle Link - Begin physical therapy following surgery - Pre-operative lab work as pre Pre-Surgical Testing - Prescriptions will be provided in hospital at time of discharge  Return to [place], MD for [place] at [facility] on 11/07/2017 at 03:15 PM  Encounter signed-off by Mickel Crow, PA-C

## 2017-10-23 ENCOUNTER — Inpatient Hospital Stay (HOSPITAL_COMMUNITY): Payer: Medicare Other

## 2017-10-23 ENCOUNTER — Inpatient Hospital Stay (HOSPITAL_COMMUNITY): Payer: Medicare Other | Admitting: Registered Nurse

## 2017-10-23 ENCOUNTER — Encounter (HOSPITAL_COMMUNITY): Payer: Self-pay | Admitting: Registered Nurse

## 2017-10-23 ENCOUNTER — Other Ambulatory Visit: Payer: Self-pay

## 2017-10-23 ENCOUNTER — Encounter (HOSPITAL_COMMUNITY)
Admission: RE | Disposition: A | Discharge status: Home Health | Payer: Self-pay | Source: Ambulatory Visit | Attending: Orthopedic Surgery

## 2017-10-23 ENCOUNTER — Inpatient Hospital Stay (HOSPITAL_COMMUNITY)
Admission: RE | Admit: 2017-10-23 | Discharge: 2017-10-24 | DRG: 470 | Disposition: A | Discharge status: Home Health | Payer: Medicare Other | Source: Ambulatory Visit | Attending: Orthopedic Surgery | Admitting: Orthopedic Surgery

## 2017-10-23 DIAGNOSIS — K219 Gastro-esophageal reflux disease without esophagitis: Secondary | ICD-10-CM | POA: Diagnosis not present

## 2017-10-23 DIAGNOSIS — Z955 Presence of coronary angioplasty implant and graft: Secondary | ICD-10-CM

## 2017-10-23 DIAGNOSIS — Z820 Family history of epilepsy and other diseases of the nervous system: Secondary | ICD-10-CM | POA: Diagnosis not present

## 2017-10-23 DIAGNOSIS — I2510 Atherosclerotic heart disease of native coronary artery without angina pectoris: Secondary | ICD-10-CM | POA: Diagnosis not present

## 2017-10-23 DIAGNOSIS — I10 Essential (primary) hypertension: Secondary | ICD-10-CM | POA: Diagnosis present

## 2017-10-23 DIAGNOSIS — Z923 Personal history of irradiation: Secondary | ICD-10-CM | POA: Diagnosis not present

## 2017-10-23 DIAGNOSIS — Z8719 Personal history of other diseases of the digestive system: Secondary | ICD-10-CM

## 2017-10-23 DIAGNOSIS — Z79899 Other long term (current) drug therapy: Secondary | ICD-10-CM

## 2017-10-23 DIAGNOSIS — E119 Type 2 diabetes mellitus without complications: Secondary | ICD-10-CM | POA: Diagnosis not present

## 2017-10-23 DIAGNOSIS — I440 Atrioventricular block, first degree: Secondary | ICD-10-CM | POA: Diagnosis not present

## 2017-10-23 DIAGNOSIS — M1612 Unilateral primary osteoarthritis, left hip: Secondary | ICD-10-CM | POA: Diagnosis not present

## 2017-10-23 DIAGNOSIS — M25752 Osteophyte, left hip: Secondary | ICD-10-CM | POA: Diagnosis not present

## 2017-10-23 DIAGNOSIS — Z85828 Personal history of other malignant neoplasm of skin: Secondary | ICD-10-CM

## 2017-10-23 DIAGNOSIS — Z8249 Family history of ischemic heart disease and other diseases of the circulatory system: Secondary | ICD-10-CM

## 2017-10-23 DIAGNOSIS — Z96649 Presence of unspecified artificial hip joint: Secondary | ICD-10-CM

## 2017-10-23 DIAGNOSIS — Z801 Family history of malignant neoplasm of trachea, bronchus and lung: Secondary | ICD-10-CM

## 2017-10-23 DIAGNOSIS — E785 Hyperlipidemia, unspecified: Secondary | ICD-10-CM | POA: Diagnosis present

## 2017-10-23 DIAGNOSIS — Z471 Aftercare following joint replacement surgery: Secondary | ICD-10-CM | POA: Diagnosis not present

## 2017-10-23 DIAGNOSIS — Z888 Allergy status to other drugs, medicaments and biological substances status: Secondary | ICD-10-CM

## 2017-10-23 DIAGNOSIS — Z7984 Long term (current) use of oral hypoglycemic drugs: Secondary | ICD-10-CM

## 2017-10-23 DIAGNOSIS — R011 Cardiac murmur, unspecified: Secondary | ICD-10-CM | POA: Diagnosis present

## 2017-10-23 DIAGNOSIS — Z8546 Personal history of malignant neoplasm of prostate: Secondary | ICD-10-CM

## 2017-10-23 DIAGNOSIS — Z96642 Presence of left artificial hip joint: Secondary | ICD-10-CM | POA: Diagnosis not present

## 2017-10-23 DIAGNOSIS — M169 Osteoarthritis of hip, unspecified: Secondary | ICD-10-CM | POA: Diagnosis present

## 2017-10-23 DIAGNOSIS — Z87891 Personal history of nicotine dependence: Secondary | ICD-10-CM

## 2017-10-23 DIAGNOSIS — C61 Malignant neoplasm of prostate: Secondary | ICD-10-CM | POA: Diagnosis not present

## 2017-10-23 DIAGNOSIS — R269 Unspecified abnormalities of gait and mobility: Secondary | ICD-10-CM | POA: Diagnosis not present

## 2017-10-23 HISTORY — PX: TOTAL HIP ARTHROPLASTY: SHX124

## 2017-10-23 LAB — COMPREHENSIVE METABOLIC PANEL
ALBUMIN: 4.3 g/dL (ref 3.5–5.0)
ALT: 21 U/L (ref 17–63)
ANION GAP: 11 (ref 5–15)
AST: 21 U/L (ref 15–41)
Alkaline Phosphatase: 76 U/L (ref 38–126)
BILIRUBIN TOTAL: 1 mg/dL (ref 0.3–1.2)
BUN: 16 mg/dL (ref 6–20)
CO2: 23 mmol/L (ref 22–32)
Calcium: 9.5 mg/dL (ref 8.9–10.3)
Chloride: 105 mmol/L (ref 101–111)
Creatinine, Ser: 0.73 mg/dL (ref 0.61–1.24)
GFR calc Af Amer: >60 mL/min (ref >60)
GLUCOSE: 124 mg/dL — AB (ref 65–99)
POTASSIUM: 4.1 mmol/L (ref 3.5–5.1)
Sodium: 139 mmol/L (ref 135–145)
TOTAL PROTEIN: 7.2 g/dL (ref 6.5–8.1)

## 2017-10-23 LAB — TYPE AND SCREEN
ABO/RH(D): O POS
Antibody Screen: NEGATIVE

## 2017-10-23 LAB — GLUCOSE, CAPILLARY
GLUCOSE-CAPILLARY: 117 mg/dL — AB (ref 65–99)
Glucose-Capillary: 137 mg/dL — ABNORMAL HIGH (ref 65–99)
Glucose-Capillary: 199 mg/dL — ABNORMAL HIGH (ref 65–99)

## 2017-10-23 SURGERY — ARTHROPLASTY, HIP, TOTAL, ANTERIOR APPROACH
Anesthesia: Spinal | Site: Hip | Laterality: Left

## 2017-10-23 MED ORDER — FENTANYL CITRATE (PF) 100 MCG/2ML IJ SOLN
INTRAMUSCULAR | Status: DC | PRN
  Administered 2017-10-23 (×2): 50 ug via INTRAVENOUS

## 2017-10-23 MED ORDER — BUPIVACAINE HCL (PF) 0.5 % IJ SOLN
INTRAMUSCULAR | Status: DC | PRN
  Administered 2017-10-23: 3 mL via INTRATHECAL

## 2017-10-23 MED ORDER — CEFAZOLIN SODIUM-DEXTROSE 2-4 GM/100ML-% IV SOLN
2.0000 g | Freq: Four times a day (QID) | INTRAVENOUS | Status: AC
  Administered 2017-10-23 – 2017-10-24 (×2): 2 g via INTRAVENOUS
  Filled 2017-10-23 (×2): qty 100

## 2017-10-23 MED ORDER — METOCLOPRAMIDE HCL 5 MG/ML IJ SOLN: 5.0000 mg | Freq: Three times a day (TID) | INTRAMUSCULAR | Status: DC | PRN

## 2017-10-23 MED ORDER — METHOCARBAMOL 1000 MG/10ML IJ SOLN
500.0000 mg | Freq: Four times a day (QID) | INTRAMUSCULAR | Status: DC | PRN
  Administered 2017-10-23: 500 mg via INTRAVENOUS
  Filled 2017-10-23: qty 550

## 2017-10-23 MED ORDER — POLYETHYLENE GLYCOL 3350 17 G PO PACK: 17.0000 g | PACK | Freq: Every day | ORAL | Status: DC | PRN

## 2017-10-23 MED ORDER — TRAMADOL HCL 50 MG PO TABS
50.0000 mg | ORAL_TABLET | Freq: Four times a day (QID) | ORAL | Status: DC | PRN
Start: 2017-10-23 — End: 2017-10-24
  Administered 2017-10-23 – 2017-10-24 (×2): 100 mg via ORAL
  Filled 2017-10-23 (×3): qty 2

## 2017-10-23 MED ORDER — CEFAZOLIN SODIUM-DEXTROSE 2-4 GM/100ML-% IV SOLN
2.0000 g | INTRAVENOUS | Status: AC
  Administered 2017-10-23: 2 g via INTRAVENOUS
  Filled 2017-10-23: qty 100

## 2017-10-23 MED ORDER — INSULIN ASPART 100 UNIT/ML ~~LOC~~ SOLN
0.0000 [IU] | Freq: Three times a day (TID) | SUBCUTANEOUS | Status: DC
  Administered 2017-10-24: 2 [IU] via SUBCUTANEOUS

## 2017-10-23 MED ORDER — PROPOFOL 10 MG/ML IV BOLUS
INTRAVENOUS | Status: DC | PRN
  Administered 2017-10-23 (×2): 20 mg via INTRAVENOUS

## 2017-10-23 MED ORDER — FLEET ENEMA 7-19 GM/118ML RE ENEM: 1.0000 | ENEMA | Freq: Once | RECTAL | Status: DC | PRN

## 2017-10-23 MED ORDER — RIVAROXABAN 10 MG PO TABS
10.0000 mg | ORAL_TABLET | Freq: Every day | ORAL | Status: DC
  Administered 2017-10-24: 10 mg via ORAL
  Filled 2017-10-23: qty 1

## 2017-10-23 MED ORDER — PHENOL 1.4 % MT LIQD: 1.0000 | OROMUCOSAL | Status: DC | PRN

## 2017-10-23 MED ORDER — PRAVASTATIN SODIUM 20 MG PO TABS
80.0000 mg | ORAL_TABLET | Freq: Every evening | ORAL | Status: DC
  Administered 2017-10-23: 80 mg via ORAL
  Filled 2017-10-23: qty 4

## 2017-10-23 MED ORDER — PROPOFOL 10 MG/ML IV BOLUS
INTRAVENOUS | Status: AC
  Filled 2017-10-23: qty 20

## 2017-10-23 MED ORDER — LIDOCAINE 2% (20 MG/ML) 5 ML SYRINGE
INTRAMUSCULAR | Status: DC | PRN
  Administered 2017-10-23: 40 mg via INTRAVENOUS

## 2017-10-23 MED ORDER — DIPHENHYDRAMINE HCL 12.5 MG/5ML PO ELIX: 12.5000 mg | ORAL_SOLUTION | ORAL | Status: DC | PRN

## 2017-10-23 MED ORDER — POTASSIUM GLUCONATE 595 (99 K) MG PO TABS
595.0000 mg | ORAL_TABLET | Freq: Every day | ORAL | Status: DC
Start: 2017-10-24 — End: 2017-10-24
  Administered 2017-10-24: 595 mg via ORAL
  Filled 2017-10-23: qty 1

## 2017-10-23 MED ORDER — METHOCARBAMOL 500 MG PO TABS
500.0000 mg | ORAL_TABLET | Freq: Four times a day (QID) | ORAL | Status: DC | PRN
  Administered 2017-10-24 (×2): 500 mg via ORAL
  Filled 2017-10-23 (×2): qty 1

## 2017-10-23 MED ORDER — BUPIVACAINE HCL (PF) 0.25 % IJ SOLN
INTRAMUSCULAR | Status: AC
  Filled 2017-10-23: qty 30

## 2017-10-23 MED ORDER — DEXAMETHASONE SODIUM PHOSPHATE 10 MG/ML IJ SOLN
10.0000 mg | Freq: Once | INTRAMUSCULAR | Status: AC
  Administered 2017-10-23: 10 mg via INTRAVENOUS

## 2017-10-23 MED ORDER — DOCUSATE SODIUM 100 MG PO CAPS
100.0000 mg | ORAL_CAPSULE | Freq: Two times a day (BID) | ORAL | Status: DC
  Administered 2017-10-23 – 2017-10-24 (×2): 100 mg via ORAL
  Filled 2017-10-23 (×2): qty 1

## 2017-10-23 MED ORDER — GABAPENTIN 100 MG PO CAPS
200.0000 mg | ORAL_CAPSULE | Freq: Every day | ORAL | Status: DC
  Administered 2017-10-23: 200 mg via ORAL
  Filled 2017-10-23: qty 2

## 2017-10-23 MED ORDER — BUPIVACAINE HCL (PF) 0.5 % IJ SOLN
INTRAMUSCULAR | Status: AC
  Filled 2017-10-23: qty 30

## 2017-10-23 MED ORDER — DEXAMETHASONE SODIUM PHOSPHATE 10 MG/ML IJ SOLN
INTRAMUSCULAR | Status: AC
  Filled 2017-10-23: qty 1

## 2017-10-23 MED ORDER — PROPOFOL 10 MG/ML IV BOLUS
INTRAVENOUS | Status: AC
  Filled 2017-10-23: qty 40

## 2017-10-23 MED ORDER — PHENYLEPHRINE HCL 10 MG/ML IJ SOLN
INTRAMUSCULAR | Status: DC | PRN
  Administered 2017-10-23 (×3): 40 ug via INTRAVENOUS

## 2017-10-23 MED ORDER — PANTOPRAZOLE SODIUM 40 MG PO TBEC
80.0000 mg | DELAYED_RELEASE_TABLET | Freq: Every day | ORAL | Status: DC
  Administered 2017-10-24: 80 mg via ORAL
  Filled 2017-10-23: qty 2

## 2017-10-23 MED ORDER — ONDANSETRON HCL 4 MG/2ML IJ SOLN: 4.0000 mg | Freq: Once | INTRAMUSCULAR | Status: DC | PRN

## 2017-10-23 MED ORDER — LACTATED RINGERS IV SOLN
INTRAVENOUS | Status: DC
  Administered 2017-10-23 (×2): via INTRAVENOUS

## 2017-10-23 MED ORDER — TRANEXAMIC ACID 1000 MG/10ML IV SOLN
INTRAVENOUS | Status: AC | PRN
  Administered 2017-10-23: 2000 mg via TOPICAL

## 2017-10-23 MED ORDER — MEPERIDINE HCL 50 MG/ML IJ SOLN: 6.2500 mg | INTRAMUSCULAR | Status: DC | PRN

## 2017-10-23 MED ORDER — ONDANSETRON HCL 4 MG/2ML IJ SOLN
INTRAMUSCULAR | Status: AC
  Filled 2017-10-23: qty 2

## 2017-10-23 MED ORDER — ALPRAZOLAM 0.25 MG PO TABS: 0.2500 mg | ORAL_TABLET | Freq: Three times a day (TID) | ORAL | Status: DC | PRN

## 2017-10-23 MED ORDER — MENTHOL 3 MG MT LOZG: 1.0000 | LOZENGE | OROMUCOSAL | Status: DC | PRN

## 2017-10-23 MED ORDER — LIDOCAINE 2% (20 MG/ML) 5 ML SYRINGE
INTRAMUSCULAR | Status: AC
  Filled 2017-10-23: qty 5

## 2017-10-23 MED ORDER — METOCLOPRAMIDE HCL 5 MG PO TABS: 5.0000 mg | ORAL_TABLET | Freq: Three times a day (TID) | ORAL | Status: DC | PRN

## 2017-10-23 MED ORDER — CHLORHEXIDINE GLUCONATE 4 % EX LIQD
60.0000 mL | Freq: Once | CUTANEOUS | Status: AC
  Administered 2017-10-23: 4 via TOPICAL

## 2017-10-23 MED ORDER — LORAZEPAM 0.5 MG PO TABS
0.5000 mg | ORAL_TABLET | Freq: Three times a day (TID) | ORAL | Status: DC | PRN
  Administered 2017-10-23: 0.5 mg via ORAL
  Filled 2017-10-23: qty 1

## 2017-10-23 MED ORDER — HYDROMORPHONE HCL 1 MG/ML IJ SOLN
0.5000 mg | INTRAMUSCULAR | Status: DC | PRN
  Administered 2017-10-24: 0.5 mg via INTRAVENOUS
  Filled 2017-10-23: qty 0.5

## 2017-10-23 MED ORDER — ONDANSETRON HCL 4 MG/2ML IJ SOLN
INTRAMUSCULAR | Status: DC | PRN
  Administered 2017-10-23: 4 mg via INTRAVENOUS

## 2017-10-23 MED ORDER — BUPIVACAINE HCL (PF) 0.25 % IJ SOLN
INTRAMUSCULAR | Status: DC | PRN
  Administered 2017-10-23: 30 mL

## 2017-10-23 MED ORDER — DEXAMETHASONE SODIUM PHOSPHATE 10 MG/ML IJ SOLN
10.0000 mg | Freq: Once | INTRAMUSCULAR | Status: AC
  Administered 2017-10-24: 10 mg via INTRAVENOUS
  Filled 2017-10-23: qty 1

## 2017-10-23 MED ORDER — PROPOFOL 500 MG/50ML IV EMUL
INTRAVENOUS | Status: DC | PRN
  Administered 2017-10-23: 75 ug/kg/min via INTRAVENOUS

## 2017-10-23 MED ORDER — HYDROMORPHONE HCL 1 MG/ML IJ SOLN: 0.2500 mg | INTRAMUSCULAR | Status: DC | PRN

## 2017-10-23 MED ORDER — STERILE WATER FOR IRRIGATION IR SOLN
Status: DC | PRN
  Administered 2017-10-23: 2000 mL

## 2017-10-23 MED ORDER — AMLODIPINE BESYLATE 5 MG PO TABS
5.0000 mg | ORAL_TABLET | Freq: Every day | ORAL | Status: DC
  Administered 2017-10-23: 5 mg via ORAL
  Filled 2017-10-23: qty 1

## 2017-10-23 MED ORDER — SODIUM CHLORIDE 0.9 % IR SOLN
Status: DC | PRN
  Administered 2017-10-23: 1000 mL

## 2017-10-23 MED ORDER — ONDANSETRON HCL 4 MG PO TABS: 4.0000 mg | ORAL_TABLET | Freq: Four times a day (QID) | ORAL | Status: DC | PRN

## 2017-10-23 MED ORDER — TRANEXAMIC ACID 1000 MG/10ML IV SOLN
2000.0000 mg | Freq: Once | INTRAVENOUS | Status: DC
  Filled 2017-10-23: qty 20

## 2017-10-23 MED ORDER — VANCOMYCIN HCL IN DEXTROSE 1-5 GM/200ML-% IV SOLN
1000.0000 mg | INTRAVENOUS | Status: AC
Start: 2017-10-23 — End: 2017-10-23
  Administered 2017-10-23: 1000 mg via INTRAVENOUS
  Filled 2017-10-23: qty 200

## 2017-10-23 MED ORDER — SODIUM CHLORIDE 0.9 % IV SOLN
INTRAVENOUS | Status: DC
  Administered 2017-10-23 (×2): via INTRAVENOUS

## 2017-10-23 MED ORDER — FENTANYL CITRATE (PF) 100 MCG/2ML IJ SOLN
INTRAMUSCULAR | Status: AC
  Filled 2017-10-23: qty 2

## 2017-10-23 MED ORDER — ACETAMINOPHEN 10 MG/ML IV SOLN
1000.0000 mg | Freq: Once | INTRAVENOUS | Status: AC
  Administered 2017-10-23: 1000 mg via INTRAVENOUS
  Filled 2017-10-23: qty 100

## 2017-10-23 MED ORDER — ONDANSETRON HCL 4 MG/2ML IJ SOLN: 4.0000 mg | Freq: Four times a day (QID) | INTRAMUSCULAR | Status: DC | PRN

## 2017-10-23 MED ORDER — BISACODYL 10 MG RE SUPP: 10.0000 mg | Freq: Every day | RECTAL | Status: DC | PRN

## 2017-10-23 SURGICAL SUPPLY — 34 items
BAG DECANTER FOR FLEXI CONT (MISCELLANEOUS) ×2 IMPLANT
BAG SPEC THK2 15X12 ZIP CLS (MISCELLANEOUS)
BAG ZIPLOCK 12X15 (MISCELLANEOUS) IMPLANT
BLADE SAG 18X100X1.27 (BLADE) ×2 IMPLANT
CAPT HIP TOTAL 2 ×2 IMPLANT
CLOTH BEACON ORANGE TIMEOUT ST (SAFETY) ×2 IMPLANT
COVER PERINEAL POST (MISCELLANEOUS) ×2 IMPLANT
COVER SURGICAL LIGHT HANDLE (MISCELLANEOUS) ×2 IMPLANT
DECANTER SPIKE VIAL GLASS SM (MISCELLANEOUS) ×2 IMPLANT
DRAPE STERI IOBAN 125X83 (DRAPES) ×2 IMPLANT
DRAPE U-SHAPE 47X51 STRL (DRAPES) ×4 IMPLANT
DRSG ADAPTIC 3X8 NADH LF (GAUZE/BANDAGES/DRESSINGS) ×2 IMPLANT
DRSG MEPILEX BORDER 4X4 (GAUZE/BANDAGES/DRESSINGS) ×2 IMPLANT
DRSG MEPILEX BORDER 4X8 (GAUZE/BANDAGES/DRESSINGS) ×2 IMPLANT
DURAPREP 26ML APPLICATOR (WOUND CARE) ×2 IMPLANT
ELECT REM PT RETURN 15FT ADLT (MISCELLANEOUS) ×2 IMPLANT
EVACUATOR 1/8 PVC DRAIN (DRAIN) ×2 IMPLANT
GLOVE BIO SURGEON STRL SZ7.5 (GLOVE) ×2 IMPLANT
GLOVE BIO SURGEON STRL SZ8 (GLOVE) ×4 IMPLANT
GLOVE BIOGEL PI IND STRL 8 (GLOVE) ×6 IMPLANT
GLOVE BIOGEL PI INDICATOR 8 (GLOVE) ×6
GOWN STRL REUS W/TWL LRG LVL3 (GOWN DISPOSABLE) ×4 IMPLANT
GOWN STRL REUS W/TWL XL LVL3 (GOWN DISPOSABLE) ×4 IMPLANT
PACK ANTERIOR HIP CUSTOM (KITS) ×2 IMPLANT
STRIP CLOSURE SKIN 1/2X4 (GAUZE/BANDAGES/DRESSINGS) ×2 IMPLANT
SUT ETHIBOND NAB CT1 #1 30IN (SUTURE) ×2 IMPLANT
SUT MNCRL AB 4-0 PS2 18 (SUTURE) ×2 IMPLANT
SUT STRATAFIX 0 PDS 27 VIOLET (SUTURE) ×2
SUT VIC AB 2-0 CT1 27 (SUTURE) ×4
SUT VIC AB 2-0 CT1 TAPERPNT 27 (SUTURE) ×2 IMPLANT
SUTURE STRATFX 0 PDS 27 VIOLET (SUTURE) ×1 IMPLANT
SYR 50ML LL SCALE MARK (SYRINGE) IMPLANT
TRAY FOLEY W/METER SILVER 16FR (SET/KITS/TRAYS/PACK) ×2 IMPLANT
YANKAUER SUCT BULB TIP 10FT TU (MISCELLANEOUS) ×2 IMPLANT

## 2017-10-23 NOTE — Interval H&P Note (Signed)
History and Physical Interval Note:  10/23/2017 12:48 PM  [name]  has presented today for surgery, with the diagnosis of Osteoarthrtiis Left Hip  The various methods of treatment have been discussed with the patient and family. After consideration of risks, benefits and other options for treatment, the patient has consented to  Procedure(s): LEFT TOTAL HIP ARTHROPLASTY ANTERIOR APPROACH (Left) as a surgical intervention .  The patient's history has been reviewed, patient examined, no change in status, stable for surgery.  I have reviewed the patient's chart and labs.  Questions were answered to the patient's satisfaction.     [name]

## 2017-10-23 NOTE — Op Note (Signed)
OPERATIVE REPORT- TOTAL HIP ARTHROPLASTY   PREOPERATIVE DIAGNOSIS: Osteoarthritis of the Left hip.   POSTOPERATIVE DIAGNOSIS: Osteoarthritis of the Left  hip.   PROCEDURE: Left total hip arthroplasty, anterior approach.   SURGEON: Gaynelle Arabian, MD   ASSISTANT: Arlee Muslim, PA-C  ANESTHESIA:  Spinal  ESTIMATED BLOOD LOSS:-300 mL    DRAINS: Hemovac x1.   COMPLICATIONS: None   CONDITION: PACU - hemodynamically stable.   BRIEF CLINICAL NOTE: [name] is a 77 y.o. male who has advanced end-  stage arthritis of their Left  hip with progressively worsening pain and  dysfunction.The patient has failed nonoperative management and presents for  total hip arthroplasty.   PROCEDURE IN DETAIL: After successful administration of spinal  anesthetic, the traction boots for the [name] bed were placed on both  feet and the patient was placed onto the [facility] bed, boots placed into the leg  holders. The Left hip was then isolated from the perineum with plastic  drapes and prepped and draped in the usual sterile fashion. ASIS and  greater trochanter were marked and a oblique incision was made, starting  at about 1 cm lateral and 2 cm distal to the ASIS and coursing towards  the anterior cortex of the femur. The skin was cut with a 10 blade  through subcutaneous tissue to the level of the fascia overlying the  tensor fascia lata muscle. The fascia was then incised in line with the  incision at the junction of the anterior third and posterior 2/3rd. The  muscle was teased off the fascia and then the interval between the TFL  and the rectus was developed. The Hohmann retractor was then placed at  the top of the femoral neck over the capsule. The vessels overlying the  capsule were cauterized and the fat on top of the capsule was removed.  A Hohmann retractor was then placed anterior underneath the rectus  femoris to give exposure to the entire anterior capsule. A T-shaped  capsulotomy  was performed. The edges were tagged and the femoral head  was identified.       Osteophytes are removed off the superior acetabulum.  The femoral neck was then cut in situ with an oscillating saw. Traction  was then applied to the left lower extremity utilizing the [facility]  traction. The femoral head was then removed. Retractors were placed  around the acetabulum and then circumferential removal of the labrum was  performed. Osteophytes were also removed. Reaming starts at 49 mm to  medialize and  Increased in 2 mm increments to 53 mm. We reamed in  approximately 40 degrees of abduction, 20 degrees anteversion. A 54 mm  pinnacle acetabular shell was then impacted in anatomic position under  fluoroscopic guidance with excellent purchase. We did not need to place  any additional dome screws. A 36 mm neutral + 4 marathon liner was then  placed into the acetabular shell.       The femoral lift was then placed along the lateral aspect of the femur  just distal to the vastus ridge. The leg was  externally rotated and capsule  was stripped off the inferior aspect of the femoral neck down to the  level of the lesser trochanter, this was done with electrocautery. The femur was lifted after this was performed. The  leg was then placed in an extended and adducted position essentially delivering the femur. We also removed the capsule superiorly and the piriformis from the piriformis  fossa to gain excellent exposure of the  proximal femur. Rongeur was used to remove some cancellous bone to get  into the lateral portion of the proximal femur for placement of the  initial starter reamer. The starter broaches was placed  the starter broach  and was shown to go down the center of the canal. Broaching  with the  Corail system was then performed starting at size 8, coursing  Up to size 12. A size 12 had excellent torsional and rotational  and axial stability. The trial high offset neck was then placed  with a 36  + 5 trial head. The hip was then reduced. We confirmed that  the stem was in the canal both on AP and lateral x-rays. It also has excellent sizing. The hip was reduced with outstanding stability through full extension and full external rotation.. AP pelvis was taken and the leg lengths were measured and found to be equal. Hip was then dislocated again and the femoral head and neck removed. The  femoral broach was removed. Size 12 Corail stem with a high offset  neck was then impacted into the femur following native anteversion. Has  excellent purchase in the canal. Excellent torsional and rotational and  axial stability. It is confirmed to be in the canal on AP and lateral  fluoroscopic views. The 36 + 5 metal head was placed and the hip  reduced with outstanding stability. Again AP pelvis was taken and it  confirmed that the leg lengths were equal. The wound was then copiously  irrigated with saline solution and the capsule reattached and repaired  with Ethibond suture. 30 ml of .25% Bupivicaine was  injected into the capsule and into the edge of the tensor fascia lata as well as subcutaneous tissue. The fascia overlying the tensor fascia lata was then closed with a running #1 V-Loc. Subcu was closed with interrupted 2-0 Vicryl and subcuticular running 4-0 Monocryl. Incision was cleaned  and dried. Steri-Strips and a bulky sterile dressing applied. Hemovac  drain was hooked to suction and then the patient was awakened and transported to  recovery in stable condition.        Please note that a surgical assistant was a medical necessity for this procedure to perform it in a safe and expeditious manner. Assistant was necessary to provide appropriate retraction of vital neurovascular structures and to prevent femoral fracture and allow for anatomic placement of the prosthesis.  Gaynelle Arabian, M.D.

## 2017-10-23 NOTE — Discharge Instructions (Addendum)
Dr. Gaynelle Arabian Total Joint Specialist [facility] [address], [place], [place] [address] [phone]  ANTERIOR APPROACH TOTAL HIP REPLACEMENT POSTOPERATIVE DIRECTIONS   Hip Rehabilitation, Guidelines Following Surgery  The results of a hip operation are greatly improved after range of motion and muscle strengthening exercises. Follow all safety measures which are given to protect your hip. If any of these exercises cause increased pain or swelling in your joint, decrease the amount until you are comfortable again. Then slowly increase the exercises. Call your caregiver if you have problems or questions.   HOME CARE INSTRUCTIONS  Remove items at home which could result in a fall. This includes throw rugs or furniture in walking pathways.   ICE to the affected hip every three hours for 30 minutes at a time and then as needed for pain and swelling.  Continue to use ice on the hip for pain and swelling from surgery. You may notice swelling that will progress down to the foot and ankle.  This is normal after surgery.  Elevate the leg when you are not up walking on it.    Continue to use the breathing machine which will help keep your temperature down.  It is common for your temperature to cycle up and down following surgery, especially at night when you are not up moving around and exerting yourself.  The breathing machine keeps your lungs expanded and your temperature down.   DIET You may resume your previous home diet once your are discharged from the hospital.  DRESSING / WOUND CARE / SHOWERING You may shower 3 days after surgery, but keep the wounds dry during showering.  You may use an occlusive plastic wrap (Press'n Seal for example), NO SOAKING/SUBMERGING IN THE BATHTUB.  If the bandage gets wet, change with a clean dry gauze.  If the incision gets wet, pat the wound dry with a clean towel. You may start showering once you are discharged home but do not submerge  the incision under water. Just pat the incision dry and apply a dry gauze dressing on daily. Change the surgical dressing daily and reapply a dry dressing each time.  ACTIVITY Walk with your walker as instructed. Use walker as long as suggested by your caregivers. Avoid periods of inactivity such as sitting longer than an hour when not asleep. This helps prevent blood clots.  You may resume a sexual relationship in one month or when given the OK by your doctor.  You may return to work once you are cleared by your doctor.  Do not drive a car for 6 weeks or until released by you surgeon.  Do not drive while taking narcotics.  WEIGHT BEARING Weight bearing as tolerated with assist device (walker, cane, etc) as directed, use it as long as suggested by your surgeon or therapist, typically at least 4-6 weeks.  POSTOPERATIVE CONSTIPATION PROTOCOL Constipation - defined medically as fewer than three stools per week and severe constipation as less than one stool per week.  One of the most common issues patients have following surgery is constipation.  Even if you have a regular bowel pattern at home, your normal regimen is likely to be disrupted due to multiple reasons following surgery.  Combination of anesthesia, postoperative narcotics, change in appetite and fluid intake all can affect your bowels.  In order to avoid complications following surgery, here are some recommendations in order to help you during your recovery period.  Colace (docusate) - Pick up an over-the-counter  form of Colace or another stool softener and take twice a day as long as you are requiring postoperative pain medications.  Take with a full glass of water daily.  If you experience loose stools or diarrhea, hold the colace until you stool forms back up.  If your symptoms do not get better within 1 week or if they get worse, check with your doctor.  Dulcolax (bisacodyl) - Pick up over-the-counter and take as directed by the  product packaging as needed to assist with the movement of your bowels.  Take with a full glass of water.  Use this product as needed if not relieved by Colace only.   MiraLax (polyethylene glycol) - Pick up over-the-counter to have on hand.  MiraLax is a solution that will increase the amount of water in your bowels to assist with bowel movements.  Take as directed and can mix with a glass of water, juice, soda, coffee, or tea.  Take if you go more than two days without a movement. Do not use MiraLax more than once per day. Call your doctor if you are still constipated or irregular after using this medication for 7 days in a row.  If you continue to have problems with postoperative constipation, please contact the office for further assistance and recommendations.  If you experience "the worst abdominal pain ever" or develop nausea or vomiting, please contact the office immediatly for further recommendations for treatment.  ITCHING  If you experience itching with your medications, try taking only a single pain pill, or even half a pain pill at a time.  You can also use Benadryl over the counter for itching or also to help with sleep.   TED HOSE STOCKINGS Wear the elastic stockings on both legs for three weeks following surgery during the day but you may remove then at night for sleeping.  MEDICATIONS See your medication summary on the After Visit Summary that the nursing staff will review with you prior to discharge.  You may have some home medications which will be placed on hold until you complete the course of blood thinner medication.  It is important for you to complete the blood thinner medication as prescribed by your surgeon.  Continue your approved medications as instructed at time of discharge.  PRECAUTIONS If you experience chest pain or shortness of breath - call 911 immediately for transfer to the hospital emergency department.  If you develop a fever greater that 101 F, purulent  drainage from wound, increased redness or drainage from wound, foul odor from the wound/dressing, or calf pain - CONTACT YOUR SURGEON.                                                   FOLLOW-UP APPOINTMENTS Make sure you keep all of your appointments after your operation with your surgeon and caregivers. You should call the office at the above phone number and make an appointment for approximately two weeks after the date of your surgery or on the date instructed by your surgeon outlined in the "After Visit Summary".  RANGE OF MOTION AND STRENGTHENING EXERCISES  These exercises are designed to help you keep full movement of your hip joint. Follow your caregiver's or physical therapist's instructions. Perform all exercises about fifteen times, three times per day or as directed. Exercise both hips, even if you  have had only one joint replacement. These exercises can be done on a training (exercise) mat, on the floor, on a table or on a bed. Use whatever works the best and is most comfortable for you. Use music or television while you are exercising so that the exercises are a pleasant break in your day. This will make your life better with the exercises acting as a break in routine you can look forward to.  Lying on your back, slowly slide your foot toward your buttocks, raising your knee up off the floor. Then slowly slide your foot back down until your leg is straight again.  Lying on your back spread your legs as far apart as you can without causing discomfort.  Lying on your side, raise your upper leg and foot straight up from the floor as far as is comfortable. Slowly lower the leg and repeat.  Lying on your back, tighten up the muscle in the front of your thigh (quadriceps muscles). You can do this by keeping your leg straight and trying to raise your heel off the floor. This helps strengthen the largest muscle supporting your knee.  Lying on your back, tighten up the muscles of your buttocks both  with the legs straight and with the knee bent at a comfortable angle while keeping your heel on the floor.   IF YOU ARE TRANSFERRED TO A SKILLED REHAB FACILITY If the patient is transferred to a skilled rehab facility following release from the hospital, a list of the current medications will be sent to the facility for the patient to continue.  When discharged from the skilled rehab facility, please have the facility set up the patient's [place] prior to being released. Also, the skilled facility will be responsible for providing the patient with their medications at time of release from the facility to include their pain medication, the muscle relaxants, and their blood thinner medication. If the patient is still at the rehab facility at time of the two week follow up appointment, the skilled rehab facility will also need to assist the patient in arranging follow up appointment in our office and any transportation needs.  MAKE SURE YOU:  Understand these instructions.  Get help right away if you are not doing well or get worse.    Pick up stool softner and laxative for home use following surgery while on pain medications. Do not submerge incision under water. Please use good hand washing techniques while changing dressing each day. May shower starting three days after surgery. Please use a clean towel to pat the incision dry following showers. Continue to use ice for pain and swelling after surgery. Do not use any lotions or creams on the incision until instructed by your surgeon.  Take Xarelto for two and a half more weeks following discharge from the hospital, then discontinue Xarelto. Once the patient has completed the Xarelto, they may resume the 81 mg Aspirin.    Information on my medicine - XARELTO (Rivaroxaban)   Why was Xarelto prescribed for you? Xarelto was prescribed for you to reduce the risk of blood clots forming after orthopedic surgery. The medical  term for these abnormal blood clots is venous thromboembolism (VTE).  What do you need to know about xarelto ? Take your Xarelto ONCE DAILY at the same time every day. You may take it either with or without food.  If you have difficulty swallowing the tablet whole, you may crush it and mix in applesauce just  prior to taking your dose.  Take Xarelto exactly as prescribed by your doctor and DO NOT stop taking Xarelto without talking to the doctor who prescribed the medication.  Stopping without other VTE prevention medication to take the place of Xarelto may increase your risk of developing a clot.  After discharge, you should have regular check-up appointments with your healthcare provider that is prescribing your Xarelto.    What do you do if you miss a dose? If you miss a dose, take it as soon as you remember on the same day then continue your regularly scheduled once daily regimen the next day. Do not take two doses of Xarelto on the same day.   Important Safety Information A possible side effect of Xarelto is bleeding. You should call your healthcare provider right away if you experience any of the following: ? Bleeding from an injury or your nose that does not stop. ? Unusual colored urine (red or dark brown) or unusual colored stools (red or black). ? Unusual bruising for unknown reasons. ? A serious fall or if you hit your head (even if there is no bleeding).  Some medicines may interact with Xarelto and might increase your risk of bleeding while on Xarelto. To help avoid this, consult your healthcare provider or pharmacist prior to using any new prescription or non-prescription medications, including herbals, vitamins, non-steroidal anti-inflammatory drugs (NSAIDs) and supplements.  This website has more information on Xarelto: [email or web address]

## 2017-10-23 NOTE — Anesthesia Procedure Notes (Signed)
Spinal  Patient location during procedure: OR Start time: 10/23/2017 3:00 PM End time: 10/23/2017 3:05 PM Staffing Anesthesiologist: Lillia Abed, MD Performed: anesthesiologist  Preanesthetic Checklist Completed: patient identified, surgical consent, pre-op evaluation, timeout performed, IV checked, risks and benefits discussed and monitors and equipment checked Spinal Block Patient position: sitting Prep: DuraPrep Patient monitoring: heart rate, cardiac monitor, continuous pulse ox and blood pressure Approach: right paramedian Location: L3-4 Injection technique: single-shot Needle Needle type: [place]  Needle gauge: 24 G Needle insertion depth: 7 cm

## 2017-10-23 NOTE — Anesthesia Postprocedure Evaluation (Signed)
Anesthesia Post Note  Patient: [name]  Procedure(s) Performed: LEFT TOTAL HIP ARTHROPLASTY ANTERIOR APPROACH (Left Hip)     Patient location during evaluation: PACU Anesthesia Type: Spinal Level of consciousness: oriented and awake and alert Pain management: pain level controlled Vital Signs Assessment: post-procedure vital signs reviewed and stable Respiratory status: spontaneous breathing, respiratory function stable and patient connected to nasal cannula oxygen Cardiovascular status: blood pressure returned to baseline and stable Postop Assessment: no headache, no backache and no apparent nausea or vomiting Anesthetic complications: no    Last Vitals:  Vitals:   10/23/17 1730 10/23/17 1745  BP: 122/71 128/71  Pulse: (!) 59 (!) 51  Resp: 16 16  Temp:    SpO2: 99% 100%    Last Pain:  Vitals:   10/23/17 1745  TempSrc:   PainSc: 0-No pain                 Murray Guzzetta DAVID

## 2017-10-23 NOTE — Anesthesia Procedure Notes (Signed)
Date/Time: 10/23/2017 3:10 PM Performed by: Talbot Grumbling, CRNA Oxygen Delivery Method: Simple face mask

## 2017-10-23 NOTE — Anesthesia Preprocedure Evaluation (Signed)
Anesthesia Evaluation  Patient identified by MRN, date of birth, ID band Patient awake    Reviewed: Allergy & Precautions, NPO status , Patient's Chart, lab work & pertinent test results  Airway Mallampati: I  TM Distance: >3 FB Neck ROM: Full    Dental   Pulmonary former smoker,    Pulmonary exam normal        Cardiovascular hypertension, Pt. on medications + CAD and + Cardiac Stents  Normal cardiovascular exam     Neuro/Psych Anxiety    GI/Hepatic GERD  Medicated and Controlled,  Endo/Other  diabetes, Type 2, Insulin Dependent  Renal/GU      Musculoskeletal   Abdominal   Peds  Hematology   Anesthesia Other Findings   Reproductive/Obstetrics                             Anesthesia Physical Anesthesia Plan  ASA: III  Anesthesia Plan: Spinal   Post-op Pain Management:    Induction: Intravenous  PONV Risk Score and Plan: 1 and Ondansetron and Treatment may vary due to age or medical condition  Airway Management Planned: Simple Face Mask  Additional Equipment:   Intra-op Plan:   Post-operative Plan:   Informed Consent: I have reviewed the patients History and Physical, chart, labs and discussed the procedure including the risks, benefits and alternatives for the proposed anesthesia with the patient or authorized representative who has indicated his/her understanding and acceptance.     Plan Discussed with: CRNA and Surgeon  Anesthesia Plan Comments:         Anesthesia Quick Evaluation

## 2017-10-23 NOTE — Transfer of Care (Signed)
Immediate Anesthesia Transfer of Care Note  Patient: [name]  Procedure(s) Performed: LEFT TOTAL HIP ARTHROPLASTY ANTERIOR APPROACH (Left Hip)  Patient Location: PACU  Anesthesia Type:Spinal  Level of Consciousness: awake, alert , oriented and patient cooperative  Airway & Oxygen Therapy: Patient Spontanous Breathing and Patient connected to face mask oxygen  Post-op Assessment: Report given to RN and Post -op Vital signs reviewed and stable  Post vital signs: stable  Last Vitals:  Vitals Value Taken Time  BP 103/68 10/23/2017  5:17 PM  Temp    Pulse 56 10/23/2017  5:18 PM  Resp 16 10/23/2017  5:19 PM  SpO2 100 % 10/23/2017  5:18 PM  Vitals shown include unvalidated device data.  Last Pain:  Vitals:   10/23/17 1245  TempSrc: Oral  PainSc: 0-No pain         Complications: No apparent anesthesia complications

## 2017-10-23 NOTE — Progress Notes (Signed)
Pt c/o 8/10 pain to back and hip. Pt already received tramadol 100mg  @ 19:55 and does not have any other pain medication orders. Called [facility] answering service received a call back from [facility]. New orders for Dilaudid 0.5mg  IV Q1hr PRN for severe pain.

## 2017-10-24 ENCOUNTER — Encounter (HOSPITAL_COMMUNITY): Payer: Self-pay | Admitting: Orthopedic Surgery

## 2017-10-24 LAB — CBC
HCT: 33.4 % — ABNORMAL LOW (ref 39.0–52.0)
HEMOGLOBIN: 11.5 g/dL — AB (ref 13.0–17.0)
MCH: 32 pg (ref 26.0–34.0)
MCHC: 34.4 g/dL (ref 30.0–36.0)
MCV: 93 fL (ref 78.0–100.0)
Platelets: 179 10*3/uL (ref 150–400)
RBC: 3.59 MIL/uL — AB (ref 4.22–5.81)
RDW: 12.8 % (ref 11.5–15.5)
WBC: 9.6 10*3/uL (ref 4.0–10.5)

## 2017-10-24 LAB — GLUCOSE, CAPILLARY
Glucose-Capillary: 130 mg/dL — ABNORMAL HIGH (ref 65–99)
Glucose-Capillary: 115 mg/dL — ABNORMAL HIGH (ref 65–99)

## 2017-10-24 LAB — BASIC METABOLIC PANEL
Anion gap: 10 (ref 5–15)
BUN: 14 mg/dL (ref 6–20)
CHLORIDE: 105 mmol/L (ref 101–111)
CO2: 24 mmol/L (ref 22–32)
CREATININE: 0.7 mg/dL (ref 0.61–1.24)
Calcium: 8.6 mg/dL — ABNORMAL LOW (ref 8.9–10.3)
GFR calc Af Amer: >60 mL/min (ref >60)
GFR calc non Af Amer: >60 mL/min (ref >60)
Glucose, Bld: 167 mg/dL — ABNORMAL HIGH (ref 65–99)
Potassium: 3.7 mmol/L (ref 3.5–5.1)
SODIUM: 139 mmol/L (ref 135–145)

## 2017-10-24 MED ORDER — METHOCARBAMOL 500 MG PO TABS: 500.0000 mg | ORAL_TABLET | Freq: Four times a day (QID) | ORAL | 0 refills | Status: DC | PRN

## 2017-10-24 MED ORDER — HYDROMORPHONE HCL 2 MG PO TABS: 2.0000 mg | ORAL_TABLET | ORAL | 0 refills | Status: DC | PRN

## 2017-10-24 MED ORDER — RIVAROXABAN 10 MG PO TABS: 10.0000 mg | ORAL_TABLET | Freq: Every day | ORAL | 0 refills | Status: DC

## 2017-10-24 MED ORDER — TRAMADOL HCL 50 MG PO TABS: 50.0000 mg | ORAL_TABLET | Freq: Four times a day (QID) | ORAL | 0 refills | Status: DC | PRN

## 2017-10-24 MED ORDER — HYDROMORPHONE HCL 2 MG PO TABS
2.0000 mg | ORAL_TABLET | ORAL | Status: DC | PRN
  Administered 2017-10-24: 4 mg via ORAL
  Administered 2017-10-24: 2 mg via ORAL
  Filled 2017-10-24: qty 1
  Filled 2017-10-24: qty 2

## 2017-10-24 NOTE — Evaluation (Signed)
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   Clinical Impression  On eval, pt was Min guard assist for mobility. He walked ~115 feet with a RW. Pain rated 6/10 with activity. Will progress activity as tolerated. Per chart, plan is for HHPT f/u. Working towards possible d/c later today if pt continues to do well.     Follow Up Recommendations Follow surgeon's recommendation for DC plan and follow-up therapies    Equipment Recommendations  Rolling walker with 5" wheels    Recommendations for Other Services       Precautions / Restrictions Precautions Precautions: Fall Restrictions Weight Bearing Restrictions: No Other Position/Activity Restrictions: WBAT      Mobility  Bed Mobility Overal bed mobility: Needs Assistance Bed Mobility: Supine to Sit     Supine to sit: Min guard     General bed mobility comments: for safety.   Transfers Overall transfer level: Needs assistance Equipment used: Rolling walker (2 wheeled) Transfers: Sit to/from Stand Sit to Stand: Min guard         General transfer comment: close guard for safety. VCs safety, hand/LE placement  Ambulation/Gait Ambulation/Gait assistance: Min guard Ambulation Distance (Feet): 115 Feet Assistive device: Rolling walker (2 wheeled) Gait Pattern/deviations: Step-through pattern;Decreased stride length     General Gait Details: close guard for safety. VCs safety, sequence, proper use of walker.   Stairs            Wheelchair Mobility    Modified Rankin (Stroke Patients Only)       Balance                                             Pertinent Vitals/Pain Pain Assessment: 0-10 Pain Score: 6  Pain Location: L hip Pain Descriptors / Indicators: Sore Pain Intervention(s): Monitored during session;Ice applied;Repositioned    Home  Living Family/patient expects to be discharged to:: Private residence Living Arrangements: Spouse/significant other Available Help at Discharge: Family Type of Home: House Home Access: Stairs to enter Entrance Stairs-Rails: Right Entrance Stairs-Number of Steps: [place]: Two level;Able to live on main level with bedroom/bathroom Home Equipment: Crutches;Cane - single point      Prior Function Level of Independence: Independent with assistive device(s)         Comments: using cane PRN prior to surgery     Hand Dominance        Extremity/Trunk Assessment   Upper Extremity Assessment Upper Extremity Assessment: Overall WFL for tasks assessed    Lower Extremity Assessment Lower Extremity Assessment: Generalized weakness(s/p L THA)    Cervical / Trunk Assessment Cervical / Trunk Assessment: Normal  Communication   Communication: No difficulties  Cognition Arousal/Alertness: Awake/alert Behavior During Therapy: WFL for tasks assessed/performed Overall Cognitive Status: Within Functional Limits for tasks assessed                                        General Comments      Exercises Total Joint Exercises Ankle Circles/Pumps: AROM;Both;10 reps;Supine Quad Sets: AROM;Both;10 reps;Supine Heel Slides: AAROM;Left;10 reps;Supine Hip ABduction/ADduction: AAROM;Left;10 reps;Supine   Assessment/Plan    PT Assessment Patient needs continued PT services  PT Problem List Decreased strength;Decreased range of motion;Decreased balance;Decreased mobility;Pain;Decreased activity tolerance;Decreased knowledge of use of DME       PT Treatment Interventions DME instruction;Gait training;Functional mobility training;Therapeutic activities;Balance training;Patient/family education;Stair training;Therapeutic exercise    PT Goals (Current goals can be found in the Care Plan section)  Acute Rehab PT Goals Patient Stated Goal: regain PLOF.  PT Goal Formulation:  With patient Time For Goal Achievement: 11/07/17 Potential to Achieve Goals: Good    Frequency 7X/week   Barriers to discharge        Co-evaluation               AM-PAC PT "6 Clicks" Daily Activity  Outcome Measure Difficulty turning over in bed (including adjusting bedclothes, sheets and blankets)?: A Little Difficulty moving from lying on back to sitting on the side of the bed? : A Little Difficulty sitting down on and standing up from a chair with arms (e.g., wheelchair, bedside commode, etc,.)?: A Little Help needed moving to and from a bed to chair (including a wheelchair)?: A Little Help needed walking in hospital room?: A Little Help needed climbing 3-5 steps with a railing? : A Little 6 Click Score: 18    End of Session Equipment Utilized During Treatment: Gait belt Activity Tolerance: Patient tolerated treatment well Patient left: in chair;with call bell/phone within reach   PT Visit Diagnosis: Muscle weakness (generalized) (M62.81);Difficulty in walking, not elsewhere classified (R26.2);Pain Pain - Right/Left: Left Pain - part of body: Hip    Time: 1025-1050 PT Time Calculation (min) (ACUTE ONLY): 25 min   Charges:   PT Evaluation $PT Eval Low Complexity: 1 Low PT Treatments $Gait Training: 8-22 mins   PT G Codes:         Weston Anna, MPT Pager: [phone]

## 2017-10-24 NOTE — Progress Notes (Signed)
Subjective: 1 Day Post-Op Procedure(s) (LRB): LEFT TOTAL HIP ARTHROPLASTY ANTERIOR APPROACH (Left) Patient reports pain as mild.   Patient seen in rounds with Dr. Wynelle Link. Patient is well, but has had some minor complaints of pain in the hip, requiring pain medications We will start therapy today.  If they do well with therapy and meets all goals, then will allow home later this afternoon following therapy. Plan is to go Home after hospital stay.  Objective: Vital signs in last 24 hours: Temp:  [97.6 F (36.4 C)-98.1 F (36.7 C)] 97.8 F (36.6 C) (04/09 0619) Pulse Rate:  [51-75] 71 (04/09 0619) Resp:  [14-18] 16 (04/09 0619) BP: (103-143)/(65-87) 126/74 (04/09 0619) SpO2:  [97 %-100 %] 100 % (04/09 0619) Weight:  [80.3 kg (177 lb)] 80.3 kg (177 lb) (04/08 1245)  Intake/Output from previous day:  Intake/Output Summary (Last 24 hours) at 10/24/2017 0918 Last data filed at 10/24/2017 0833 Gross per 24 hour  Intake 4370 ml  Output 4005 ml  Net 365 ml    Intake/Output this shift: Total I/O In: 240 [P.O.:240] Out: 0   Labs: Recent Labs    10/24/17 0537  HGB 11.5*   Recent Labs    10/24/17 0537  WBC 9.6  RBC 3.59*  HCT 33.4*  PLT 179   Recent Labs    10/23/17 1301 10/24/17 0537  NA 139 139  K 4.1 3.7  CL 105 105  CO2 23 24  BUN 16 14  CREATININE 0.73 0.70  GLUCOSE 124* 167*  CALCIUM 9.5 8.6*   No results for input(s): LABPT, INR in the last 72 hours.  EXAM General - Patient is Alert, Appropriate and Oriented Extremity - Neurovascular intact Sensation intact distally Intact pulses distally Dressing - dressing C/D/I Motor Function - intact, moving foot and toes well on exam.  Hemovac pulled without difficulty.  Past Medical History:  Diagnosis Date  . 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 Aortic atherosclerosis ([place])   . Biceps muscle tear    right  . CAD (coronary artery disease)    anterior ischemia on a stress perfusion study.  (Catheterization  in 2007, demonstrating 95% mid-LAD stenosis.  The circumflex has 20% proximal stenosis, the right coronary artery had 20% mid stenosis.  The EF was 60%.  He had a non drug eluting stent placed.    . Diverticulosis of colon (without mention of hemorrhage)   . Dyspnea    mild with extreme exertion  . Esophageal reflux   . Esophageal stricture   . History of bronchitis   . HLD (hyperlipidemia)   . Hot flashes   . Malignant neoplasm of prostate ([place])   . Osteoarthrosis, unspecified whether generalized or localized, unspecified site   . Osteoarthrosis, unspecified whether generalized or localized, unspecified site   . Other and unspecified hyperlipidemia   . Pulmonary nodule    has resolved  . Recurrent aspiration bronchitis/pneumonia ([place])   . Skin cancer   . Spinal stenosis   . Type 2 diabetes mellitus ([place])   . Unspecified essential hypertension     Assessment/Plan: 1 Day Post-Op Procedure(s) (LRB): LEFT TOTAL HIP ARTHROPLASTY ANTERIOR APPROACH (Left) Active Problems:   OA (osteoarthritis) of hip  Estimated body mass index is 24.69 kg/m as calculated from the following:   Height as of this encounter: 5\' 11"  (1.803 m).   Weight as of this encounter: 80.3 kg (177 lb). Up with therapy  DVT Prophylaxis - Xarelto Weight Bearing As  Tolerated left Leg Hemovac Pulled Begin Therapy  If meets goals and able to go home: Up with therapy Diet - Cardiac diet and Diabetic diet Follow up - in 2 weeks Activity - WBAT Disposition - Home Condition Upon Discharge - Stable D/C Meds - See DC Summary DVT Prophylaxis - Xarelto  Arlee Muslim, PA-C Orthopaedic Surgery 10/24/2017, 9:18 AM

## 2017-10-24 NOTE — Progress Notes (Signed)
Discharge planning, spoke with patient at bedside. Have chosen [facility] for HHPT, evaluate and treat. Contacted [facility] for referral. Needs RW and has a 3n1, contacted [facility] to deliver RW to room. [phone]

## 2017-10-24 NOTE — Progress Notes (Signed)
Physical Therapy Treatment Patient Details Name: [name] MRN: [id number] DOB: [birth year] Today's Date: 10/24/2017    History of Present Illness 77 yo male s/p L THA-DA 10/23/17.     PT Comments    Reviewed/practiced exercises, gait training, and stair training. All education completed. Okay to d/c from PT standpoint-RN aware.    Follow Up Recommendations  Follow surgeon's recommendation for DC plan and follow-up therapies     Equipment Recommendations  Rolling walker with 5" wheels    Recommendations for Other Services       Precautions / Restrictions Precautions Precautions: Fall Restrictions Weight Bearing Restrictions: No Other Position/Activity Restrictions: WBAT    Mobility  Bed Mobility Overal bed mobility: Needs Assistance Bed Mobility: Supine to Sit     Supine to sit: Min guard     General bed mobility comments: oob in recliner  Transfers Overall transfer level: Needs assistance Equipment used: Rolling walker (2 wheeled) Transfers: Sit to/from Stand Sit to Stand: Min guard         General transfer comment: close guard for safety. VCs safety, hand/LE placement  Ambulation/Gait Ambulation/Gait assistance: Min guard Ambulation Distance (Feet): 100 Feet Assistive device: Rolling walker (2 wheeled) Gait Pattern/deviations: Step-through pattern;Decreased stride length     General Gait Details: close guard for safety. VCs safety, sequence, proper use of walker.    Stairs Stairs: Yes Min guard Assist Stair Management: Step to pattern;Forwards Number of Stairs: 2 General stair comments: up and overt portable steps. VCs safety, sequence. close guard for safety.   Wheelchair Mobility    Modified Rankin (Stroke Patients Only)       Balance                                            Cognition Arousal/Alertness: Awake/alert Behavior During Therapy: WFL for tasks assessed/performed Overall Cognitive Status: Within  Functional Limits for tasks assessed                                        Exercises Total Joint Exercises Ankle Circles/Pumps: AROM;Both;10 reps;Supine Quad Sets: AROM;Both;10 reps;Supine Heel Slides: AAROM;Left;10 reps;Supine Hip ABduction/ADduction: AROM;Left;10 reps;Standing Long Arc Quad: AAROM;Left;10 reps;Seated Knee Flexion: AROM;Left;10 reps;Standing Marching in Standing: AROM;Both;10 reps;Standing General Exercises - Lower Extremity Heel Raises: AROM;Both;10 reps;Standing    General Comments        Pertinent Vitals/Pain Pain Assessment: 0-10 Pain Score: 7  Pain Location: L hip Pain Descriptors / Indicators: Sore;Aching Pain Intervention(s): Monitored during session;Repositioned;Ice applied    Home Living Family/patient expects to be discharged to:: Private residence Living Arrangements: Spouse/significant other Available Help at Discharge: Family Type of Home: House Home Access: Stairs to enter Entrance Stairs-Rails: Right Home Layout: Two level;Able to live on main level with bedroom/bathroom Home Equipment: Crutches;Cane - single point      Prior Function Level of Independence: Independent with assistive device(s)      Comments: using cane PRN prior to surgery   PT Goals (current goals can now be found in the care plan section) Acute Rehab PT Goals Patient Stated Goal: regain PLOF.  PT Goal Formulation: With patient Time For Goal Achievement: 11/07/17 Potential to Achieve Goals: Good Progress towards PT goals: Progressing toward goals    Frequency    7X/week  PT Plan Current plan remains appropriate    Co-evaluation              AM-PAC PT "6 Clicks" Daily Activity  Outcome Measure  Difficulty turning over in bed (including adjusting bedclothes, sheets and blankets)?: A Little Difficulty moving from lying on back to sitting on the side of the bed? : A Little Difficulty sitting down on and standing up from a chair  with arms (e.g., wheelchair, bedside commode, etc,.)?: A Little Help needed moving to and from a bed to chair (including a wheelchair)?: A Little Help needed walking in hospital room?: A Little Help needed climbing 3-5 steps with a railing? : A Little 6 Click Score: 18    End of Session Equipment Utilized During Treatment: Gait belt Activity Tolerance: Patient tolerated treatment well Patient left: in chair;with call bell/phone within reach   PT Visit Diagnosis: Difficulty in walking, not elsewhere classified (R26.2);Pain;Muscle weakness (generalized) (M62.81) Pain - Right/Left: Left Pain - part of body: Hip     Time: 4327-6147 PT Time Calculation (min) (ACUTE ONLY): 13 min  Charges:  $Gait Training: 8-22 mins                    G Codes:          Weston Anna, MPT Pager: [phone]

## 2017-10-24 NOTE — Discharge Summary (Signed)
Physician Discharge Summary   Patient ID: [name] MRN: [id number] DOB/AGE: 1940/07/30 77 y.o.  Admit date: 10/23/2017 Discharge date: 10/24/2017  Primary Diagnosis:  Osteoarthritis of the Left hip.   Admission Diagnoses:  Past Medical History:  Diagnosis Date  . 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 Aortic atherosclerosis ([place])   . Biceps muscle tear    right  . CAD (coronary artery disease)    anterior ischemia on a stress perfusion study.  (Catheterization in 2007, demonstrating 95% mid-LAD stenosis.  The circumflex has 20% proximal stenosis, the right coronary artery had 20% mid stenosis.  The EF was 60%.  He had a non drug eluting stent placed.    . Diverticulosis of colon (without mention of hemorrhage)   . Dyspnea    mild with extreme exertion  . Esophageal reflux   . Esophageal stricture   . History of bronchitis   . HLD (hyperlipidemia)   . Hot flashes   . Malignant neoplasm of prostate ([place])   . Osteoarthrosis, unspecified whether generalized or localized, unspecified site   . Osteoarthrosis, unspecified whether generalized or localized, unspecified site   . Other and unspecified hyperlipidemia   . Pulmonary nodule    has resolved  . Recurrent aspiration bronchitis/pneumonia ([place])   . Skin cancer   . Spinal stenosis   . Type 2 diabetes mellitus ([place])   . Unspecified essential hypertension    Discharge Diagnoses:   Active Problems:   OA (osteoarthritis) of hip  Estimated body mass index is 24.69 kg/m as calculated from the following:   Height as of this encounter: '5\' 11"'$  (1.803 m).   Weight as of this encounter: 80.3 kg (177 lb).  Procedure(s) (LRB): LEFT TOTAL HIP ARTHROPLASTY ANTERIOR APPROACH (Left)   Consults: None  HPI: [name] is a 77 y.o. male who has advanced end-  stage arthritis of their Left  hip with progressively worsening pain and  dysfunction.The patient has failed nonoperative management and presents for  total hip arthroplasty.     Laboratory Data: Admission on 10/23/2017, Discharged on 10/24/2017  Component Date Value Ref Range Status  . Sodium 10/23/2017 139  135 - 145 mmol/L Final  . Potassium 10/23/2017 4.1  3.5 - 5.1 mmol/L Final  . Chloride 10/23/2017 105  101 - 111 mmol/L Final  . CO2 10/23/2017 23  22 - 32 mmol/L Final  . Glucose, Bld 10/23/2017 124* 65 - 99 mg/dL Final  . BUN 10/23/2017 16  6 - 20 mg/dL Final  . Creatinine, Ser 10/23/2017 0.73  0.61 - 1.24 mg/dL Final  . Calcium 10/23/2017 9.5  8.9 - 10.3 mg/dL Final  . Total Protein 10/23/2017 7.2  6.5 - 8.1 g/dL Final  . Albumin 10/23/2017 4.3  3.5 - 5.0 g/dL Final  . AST 10/23/2017 21  15 - 41 U/L Final  . ALT 10/23/2017 21  17 - 63 U/L Final  . Alkaline Phosphatase 10/23/2017 76  38 - 126 U/L Final  . Total Bilirubin 10/23/2017 1.0  0.3 - 1.2 mg/dL Final  . GFR calc non Af Amer 10/23/2017 >60  >60 mL/min Final  . GFR calc Af Amer 10/23/2017 >60  >60 mL/min Final   Comment: (NOTE) The eGFR has been calculated using the CKD EPI equation. This calculation has not been validated in all clinical situations. eGFR's persistently <60 mL/min signify possible Chronic Kidney Disease.   . Anion gap 10/23/2017 11  5 - 15 Final   Performed  at [facility], [place] [address] [place], [place] [address]  . Glucose-Capillary 10/23/2017 117* 65 - 99 mg/dL Final  . Glucose-Capillary 10/23/2017 137* 65 - 99 mg/dL Final  . Comment 1 10/23/2017 Notify RN   Final  . Comment 2 10/23/2017 Document in Chart   Final  . WBC 10/24/2017 9.6  4.0 - 10.5 K/uL Final  . RBC 10/24/2017 3.59* 4.22 - 5.81 MIL/uL Final  . Hemoglobin 10/24/2017 11.5* 13.0 - 17.0 g/dL Final  . HCT 10/24/2017 33.4* 39.0 - 52.0 % Final  . MCV 10/24/2017 93.0  78.0 - 100.0 fL Final  . MCH 10/24/2017 32.0  26.0 - 34.0 pg Final  . MCHC 10/24/2017 34.4  30.0 - 36.0 g/dL Final  . RDW 10/24/2017 12.8  11.5 - 15.5 % Final  . Platelets 10/24/2017 179  150 - 400 K/uL Final   Performed  at [facility], [place] [address] [place], [place] [address]  . Sodium 10/24/2017 139  135 - 145 mmol/L Final  . Potassium 10/24/2017 3.7  3.5 - 5.1 mmol/L Final  . Chloride 10/24/2017 105  101 - 111 mmol/L Final  . CO2 10/24/2017 24  22 - 32 mmol/L Final  . Glucose, Bld 10/24/2017 167* 65 - 99 mg/dL Final  . BUN 10/24/2017 14  6 - 20 mg/dL Final  . Creatinine, Ser 10/24/2017 0.70  0.61 - 1.24 mg/dL Final  . Calcium 10/24/2017 8.6* 8.9 - 10.3 mg/dL Final  . GFR calc non Af Amer 10/24/2017 >60  >60 mL/min Final  . GFR calc Af Amer 10/24/2017 >60  >60 mL/min Final   Comment: (NOTE) The eGFR has been calculated using the CKD EPI equation. This calculation has not been validated in all clinical situations. eGFR's persistently <60 mL/min signify possible Chronic Kidney Disease.   Georgiann Hahn gap 10/24/2017 10  5 - 15 Final   Performed at [facility], [place] [address], [place], [place] [address]  . Glucose-Capillary 10/23/2017 199* 65 - 99 mg/dL Final  . Glucose-Capillary 10/24/2017 130* 65 - 99 mg/dL Final  . Glucose-Capillary 10/24/2017 115* 65 - 99 mg/dL Final  Hospital Outpatient Visit on 10/19/2017  Component Date Value Ref Range Status  . Glucose-Capillary 10/19/2017 103* 65 - 99 mg/dL Final  . MRSA, PCR 10/19/2017 POSITIVE* NEGATIVE Final   Comment: RESULT CALLED TO, READ BACK BY AND VERIFIED WITH: STANLEY,K. _0  ON 4.5.19 BY NMCCOY   . Staphylococcus aureus 10/19/2017 POSITIVE* NEGATIVE Final   Comment: (NOTE) The Xpert SA Assay (FDA approved for NASAL specimens in patients 67 years of age and older), is one component of a comprehensive surveillance program. It is not intended to diagnose infection nor to guide or monitor treatment. Performed at [facility], [place] [address], [place], [place] [address]   . Sodium 10/19/2017 138  135 - 145 mmol/L Final  . Potassium 10/19/2017 4.8  3.5 - 5.1 mmol/L Final  . Chloride  10/19/2017 104  101 - 111 mmol/L Final  . CO2 10/19/2017 26  22 - 32 mmol/L Final  . Glucose, Bld 10/19/2017 126* 65 - 99 mg/dL Final  . BUN 10/19/2017 18  6 - 20 mg/dL Final  . Creatinine, Ser 10/19/2017 0.74  0.61 - 1.24 mg/dL Final  . Calcium 10/19/2017 9.3  8.9 - 10.3 mg/dL Final  . GFR calc non Af Amer 10/19/2017 >60  >60 mL/min Final  . GFR calc Af Amer 10/19/2017 >60  >60 mL/min Final   Comment: (  NOTE) The eGFR has been calculated using the CKD EPI equation. This calculation has not been validated in all clinical situations. eGFR's persistently <60 mL/min signify possible Chronic Kidney Disease.   Georgiann Hahn gap 10/19/2017 8  5 - 15 Final   Performed at [facility], [place] [address] [place], [place] [address]  . WBC 10/19/2017 5.7  4.0 - 10.5 K/uL Final  . RBC 10/19/2017 4.00* 4.22 - 5.81 MIL/uL Final  . Hemoglobin 10/19/2017 12.8* 13.0 - 17.0 g/dL Final  . HCT 10/19/2017 37.4* 39.0 - 52.0 % Final  . MCV 10/19/2017 93.5  78.0 - 100.0 fL Final  . MCH 10/19/2017 32.0  26.0 - 34.0 pg Final  . MCHC 10/19/2017 34.2  30.0 - 36.0 g/dL Final  . RDW 10/19/2017 13.0  11.5 - 15.5 % Final  . Platelets 10/19/2017 178  150 - 400 K/uL Final   Performed at [facility], [place] [address], [place], [place] [address]  . ABO/RH(D) 10/19/2017 O POS   Final  . Antibody Screen 10/19/2017 NEG   Final  . Sample Expiration 10/19/2017 10/26/2017   Final  . Extend sample reason 10/19/2017    Final                   Value:NO TRANSFUSIONS OR PREGNANCY IN THE PAST 3 MONTHS Performed at [facility], [place] [address] [place], [place] [address]   . ABO/RH(D) 10/19/2017    Final                   Value:O POS Performed at [facility], [place] [address], [place], [place] [address]   Appointment on 09/19/2017  Component Date Value Ref Range Status  . Hgb A1c MFr Bld 09/19/2017 6.6* 4.6 - 6.5 % Final   Glycemic Control Guidelines for People  with Diabetes:Non Diabetic:  <6%Goal of Therapy: <7%Additional Action Suggested:  >8%   . PSA 09/19/2017 0.13  0.10 - 4.00 ng/mL Final   Test performed using Access [employer] PSA Assay, a parmagnetic partical, chemiluminecent immunoassay.  . WBC 09/19/2017 7.0  4.0 - 10.5 K/uL Final  . RBC 09/19/2017 4.30  4.22 - 5.81 Mil/uL Final  . Hemoglobin 09/19/2017 13.7  13.0 - 17.0 g/dL Final  . HCT 09/19/2017 40.2  39.0 - 52.0 % Final  . MCV 09/19/2017 93.6  78.0 - 100.0 fl Final  . MCHC 09/19/2017 34.1  30.0 - 36.0 g/dL Final  . RDW 09/19/2017 13.5  11.5 - 15.5 % Final  . Platelets 09/19/2017 193.0  150.0 - 400.0 K/uL Final  . Neutrophils Relative % 09/19/2017 39.9* 43.0 - 77.0 % Final  . Lymphocytes Relative 09/19/2017 46.5* 12.0 - 46.0 % Final  . Monocytes Relative 09/19/2017 8.9  3.0 - 12.0 % Final  . Eosinophils Relative 09/19/2017 3.8  0.0 - 5.0 % Final  . Basophils Relative 09/19/2017 0.9  0.0 - 3.0 % Final  . Neutro Abs 09/19/2017 2.8  1.4 - 7.7 K/uL Final  . Lymphs Abs 09/19/2017 3.2  0.7 - 4.0 K/uL Final  . Monocytes Absolute 09/19/2017 0.6  0.1 - 1.0 K/uL Final  . Eosinophils Absolute 09/19/2017 0.3  0.0 - 0.7 K/uL Final  . Basophils Absolute 09/19/2017 0.1  0.0 - 0.1 K/uL Final  . TSH 09/19/2017 2.09  0.35 - 4.50 uIU/mL Final  . Cholesterol 09/19/2017 171  0 - 200 mg/dL Final   ATP III Classification       Desirable:  <  200 mg/dL               Borderline High:  200 - 239 mg/dL          High:  > = 240 mg/dL  . Triglycerides 09/19/2017 189.0* 0.0 - 149.0 mg/dL Final   Normal:  <150 mg/dLBorderline High:  150 - 199 mg/dL  . HDL 09/19/2017 40.20  >39.00 mg/dL Final  . VLDL 09/19/2017 37.8  0.0 - 40.0 mg/dL Final  . LDL Cholesterol 09/19/2017 93  0 - 99 mg/dL Final  . Total CHOL/HDL Ratio 09/19/2017 4   Final                  Men          Women1/2 Average Risk     3.4          3.3Average Risk          5.0          4.42X Average Risk          9.6          7.13X Average Risk          15.0           11.0                      . NonHDL 09/19/2017 130.99   Final   NOTE:  Non-HDL goal should be 30 mg/dL higher than patient's LDL goal (i.e. LDL goal of < 70 mg/dL, would have non-HDL goal of < 100 mg/dL)  . Sodium 09/19/2017 140  135 - 145 mEq/L Final  . Potassium 09/19/2017 5.2* 3.5 - 5.1 mEq/L Final  . Chloride 09/19/2017 105  96 - 112 mEq/L Final  . CO2 09/19/2017 28  19 - 32 mEq/L Final  . Glucose, Bld 09/19/2017 131* 70 - 99 mg/dL Final  . BUN 09/19/2017 23  6 - 23 mg/dL Final  . Creatinine, Ser 09/19/2017 0.80  0.40 - 1.50 mg/dL Final  . Total Bilirubin 09/19/2017 0.5  0.2 - 1.2 mg/dL Final  . Alkaline Phosphatase 09/19/2017 80  39 - 117 U/L Final  . AST 09/19/2017 16  0 - 37 U/L Final  . ALT 09/19/2017 17  0 - 53 U/L Final  . Total Protein 09/19/2017 6.4  6.0 - 8.3 g/dL Final  . Albumin 09/19/2017 4.1  3.5 - 5.2 g/dL Final  . Calcium 09/19/2017 9.9  8.4 - 10.5 mg/dL Final  . GFR 09/19/2017 99.88  >60.00 mL/min Final     X-Rays:Dg Pelvis Portable  Result Date: 10/23/2017 CLINICAL DATA:  Status post LEFT hip arthroplasty. EXAM: PORTABLE PELVIS 1-2 VIEWS COMPARISON:  Fluoroscopic radiographs October 23, 2017 FINDINGS: Status post LEFT hip total arthroplasty with intact well-seated non cemented hardware. No fracture deformity. No dislocation. Surgical clips project in prostate. Phleboliths project in the pelvis. LEFT hip subcutaneous gas with surgical drain. IMPRESSION: Status post LEFT hip total arthroplasty, surgical drain in place. Electronically Signed   By: Elon Alas M.D.   On: 10/23/2017 17:51   Dg C-arm 1-60 Min-no Report  Result Date: 10/23/2017 Fluoroscopy was utilized by the requesting physician.  No radiographic interpretation.    EKG: Orders placed or performed in visit on 10/13/17  . EKG 12-Lead     Hospital Course: [facility] and taken to the OR and underwent the above state procedure without complications.  Patient  tolerated the procedure well and  was later transferred to the recovery room and then to the orthopaedic floor for postoperative care.  They were given PO and IV analgesics for pain control following their surgery.  They were given 24 hours of postoperative antibiotics of  Anti-infectives (From admission, onward)   Start     Dose/Rate Route Frequency Ordered Stop   10/24/17 0600  ceFAZolin (ANCEF) IVPB 2g/100 mL premix     2 g 200 mL/hr over 30 Minutes Intravenous On call to O.R. 10/23/17 1221 10/23/17 1515   10/23/17 2130  ceFAZolin (ANCEF) IVPB 2g/100 mL premix     2 g 200 mL/hr over 30 Minutes Intravenous Every 6 hours 10/23/17 1924 10/24/17 0454   10/23/17 1222  vancomycin (VANCOCIN) IVPB 1000 mg/200 mL premix     1,000 mg 200 mL/hr over 60 Minutes Intravenous 60 min pre-op 10/23/17 1222 10/23/17 1458     and started on DVT prophylaxis in the form of Xarelto.   PT and OT were ordered for total hip protocol.  The patient was allowed to be WBAT with therapy. Discharge planning was consulted to help with postop disposition and equipment needs.  Patient had a decent night on the evening of surgery.  They started to get up OOB with therapy on day one and did well with two sessions.  Hemovac drain was pulled without difficulty.  Dressing was checked and was okay. Patient was seen in rounds and was ready to go home.  Diet - Cardiac diet and Diabetic diet Follow up - in 2 weeks Activity - WBAT Disposition - Home Condition Upon Discharge - Stable D/C Meds - See DC Summary DVT Prophylaxis - Xarelto     Discharge Instructions    Call MD / Call 911   Complete by:  As directed    If you experience chest pain or shortness of breath, CALL 911 and be transported to the hospital emergency room.  If you develope a fever above 101 F, pus (white drainage) or increased drainage or redness at the wound, or calf pain, call your surgeon's office.   Change dressing   Complete by:  As directed    You may  change your dressing dressing daily with sterile 4 x 4 inch gauze dressing and paper tape.  Do not submerge the incision under water.   Constipation Prevention   Complete by:  As directed    Drink plenty of fluids.  Prune juice may be helpful.  You may use a stool softener, such as Colace (over the counter) 100 mg twice a day.  Use MiraLax (over the counter) for constipation as needed.   Diet - low sodium heart healthy   Complete by:  As directed    Discharge instructions   Complete by:  As directed    Take Xarelto for two and a half more weeks, then discontinue Xarelto. Once the patient has completed the Xarelto, they may resume the 81 mg Aspirin.  Pick up stool softner and laxative for home use following surgery while on pain medications. Do not submerge incision under water. Please use good hand washing techniques while changing dressing each day. May shower starting three days after surgery. Please use a clean towel to pat the incision dry following showers. Continue to use ice for pain and swelling after surgery. Do not use any lotions or creams on the incision until instructed by your surgeon.  Wear both TED hose on both legs during the day every day for three weeks, but may remove  the TED hose at night at home.  Postoperative Constipation Protocol  Constipation - defined medically as fewer than three stools per week and severe constipation as less than one stool per week.  One of the most common issues patients have following surgery is constipation.  Even if you have a regular bowel pattern at home, your normal regimen is likely to be disrupted due to multiple reasons following surgery.  Combination of anesthesia, postoperative narcotics, change in appetite and fluid intake all can affect your bowels.  In order to avoid complications following surgery, here are some recommendations in order to help you during your recovery period.  Colace (docusate) - Pick up an over-the-counter  form of Colace or another stool softener and take twice a day as long as you are requiring postoperative pain medications.  Take with a full glass of water daily.  If you experience loose stools or diarrhea, hold the colace until you stool forms back up.  If your symptoms do not get better within 1 week or if they get worse, check with your doctor.  Dulcolax (bisacodyl) - Pick up over-the-counter and take as directed by the product packaging as needed to assist with the movement of your bowels.  Take with a full glass of water.  Use this product as needed if not relieved by Colace only.   MiraLax (polyethylene glycol) - Pick up over-the-counter to have on hand.  MiraLax is a solution that will increase the amount of water in your bowels to assist with bowel movements.  Take as directed and can mix with a glass of water, juice, soda, coffee, or tea.  Take if you go more than two days without a movement. Do not use MiraLax more than once per day. Call your doctor if you are still constipated or irregular after using this medication for 7 days in a row.  If you continue to have problems with postoperative constipation, please contact the office for further assistance and recommendations.  If you experience "the worst abdominal pain ever" or develop nausea or vomiting, please contact the office immediatly for further recommendations for treatment.   Do not sit on low chairs, stoools or toilet seats, as it may be difficult to get up from low surfaces   Complete by:  As directed    Driving restrictions   Complete by:  As directed    No driving until released by the physician.   Increase activity slowly as tolerated   Complete by:  As directed    Lifting restrictions   Complete by:  As directed    No lifting until released by the physician.   Patient may shower   Complete by:  As directed    You may shower without a dressing once there is no drainage.  Do not wash over the wound.  If drainage remains,  do not shower until drainage stops.   TED hose   Complete by:  As directed    Use stockings (TED hose) for 3 weeks on both leg(s).  You may remove them at night for sleeping.   Weight bearing as tolerated   Complete by:  As directed    Laterality:  left   Extremity:  Lower     Allergies as of 10/24/2017      Reactions   Simvastatin Other (See Comments)    pt states INTOL to [facility] w/ leg pains      Medication List    STOP taking these medications  aspirin EC 81 MG tablet   b complex vitamins tablet   CALCIUM 600+D3 PO   [employer] VITAMIN B-12 2000 MCG Tbcr Generic drug:  Cyanocobalamin   Fish Oil 600 MG Caps   GLUCOSAMINE SULFATE PO   MULTIVITAMIN PO     TAKE these medications   acetaminophen 500 MG tablet Commonly known as:  TYLENOL Take 500 mg by mouth 3 (three) times daily as needed (for pain.).   ALPRAZolam 0.5 MG tablet Commonly known as:  XANAX Take 0.25-0.5 mg by mouth 3 (three) times daily as needed for anxiety.   amLODipine 5 MG tablet Commonly known as:  NORVASC Take 1 tablet (5 mg total) by mouth daily. What changed:  when to take this   gabapentin 100 MG capsule Commonly known as:  NEURONTIN TAKE 2 CAPSULEC BY MOUTH EVERY NIGHT AT BEDTIME.   HYDROmorphone 2 MG tablet Commonly known as:  DILAUDID Take 1-2 tablets (2-4 mg total) by mouth every 4 (four) hours as needed for moderate pain or severe pain.   lisinopril 10 MG tablet Commonly known as:  PRINIVIL,ZESTRIL TAKE 1 TABLET BY MOUTH EVERY DAY What changed:    how much to take  how to take this  when to take this   LORazepam 1 MG tablet Commonly known as:  ATIVAN TAKE ONE-HALF TO 1 TABLET BY MOUTH THREE TIMES DAILY AS NEEDED FOR ANXIETY   Magnesium 400 MG Caps Take 400 mg by mouth daily.   metFORMIN 500 MG 24 hr tablet Commonly known as:  GLUCOPHAGE XR Take 1 tablet (500 mg total) by mouth daily with breakfast. What changed:  when to take this   methocarbamol 500 MG tablet Commonly  known as:  ROBAXIN Take 1 tablet (500 mg total) by mouth every 6 (six) hours as needed for muscle spasms.   omeprazole 40 MG capsule Commonly known as:  PRILOSEC TAKE ONE CAPSULE BY MOUTH EVERY DAY   Potassium 99 MG Tabs Take 99 mg by mouth daily.   pravastatin 80 MG tablet Commonly known as:  PRAVACHOL Take 1 tablet (80 mg total) by mouth daily. What changed:  when to take this   rivaroxaban 10 MG Tabs tablet Commonly known as:  XARELTO Take 1 tablet (10 mg total) by mouth daily with breakfast. Take Xarelto for two and a half more weeks following discharge from the hospital, then discontinue Xarelto. Once the patient has completed the Xarelto, they may resume the 81 mg Aspirin.   traMADol 50 MG tablet Commonly known as:  ULTRAM Take 1-2 tablets (50-100 mg total) by mouth every 6 (six) hours as needed (mild pain). What changed:    how much to take  when to take this  reasons to take this            Discharge Care Instructions  (From admission, onward)        Start     Ordered   10/24/17 0000  Weight bearing as tolerated    Question Answer Comment  Laterality left   Extremity Lower      10/24/17 0922   10/24/17 0000  Change dressing    Comments:  You may change your dressing dressing daily with sterile 4 x 4 inch gauze dressing and paper tape.  Do not submerge the incision under water.   10/24/17 1324     Follow-up Information    Gaynelle Arabian, MD. Schedule an appointment as soon as possible for a visit on 11/07/2017.   Specialty:  Orthopedic  Surgery Contact information: [address] [place] [address] [phone]        [facility] Follow up.   Specialty:  Home Health Services Why:  physical therapy Contact information: [address] [place] [address] [place] Follow up.   Why:  walker Contact information: [address] [place]  [address] [phone]           Signed: Arlee Muslim, PA-C Orthopaedic Surgery 11/07/2017, 5:45 PM

## 2017-10-26 DIAGNOSIS — R1310 Dysphagia, unspecified: Secondary | ICD-10-CM | POA: Diagnosis not present

## 2017-10-26 DIAGNOSIS — J42 Unspecified chronic bronchitis: Secondary | ICD-10-CM | POA: Diagnosis not present

## 2017-10-26 DIAGNOSIS — Z471 Aftercare following joint replacement surgery: Secondary | ICD-10-CM | POA: Diagnosis not present

## 2017-10-26 DIAGNOSIS — I10 Essential (primary) hypertension: Secondary | ICD-10-CM | POA: Diagnosis not present

## 2017-10-26 DIAGNOSIS — E119 Type 2 diabetes mellitus without complications: Secondary | ICD-10-CM | POA: Diagnosis not present

## 2017-10-26 DIAGNOSIS — I2510 Atherosclerotic heart disease of native coronary artery without angina pectoris: Secondary | ICD-10-CM | POA: Diagnosis not present

## 2017-11-02 DIAGNOSIS — C61 Malignant neoplasm of prostate: Secondary | ICD-10-CM | POA: Diagnosis not present

## 2017-11-02 DIAGNOSIS — N5201 Erectile dysfunction due to arterial insufficiency: Secondary | ICD-10-CM | POA: Diagnosis not present

## 2017-11-19 ENCOUNTER — Other Ambulatory Visit: Payer: Self-pay | Admitting: Pulmonary Disease

## 2017-11-28 DIAGNOSIS — Z96642 Presence of left artificial hip joint: Secondary | ICD-10-CM | POA: Diagnosis not present

## 2017-11-28 DIAGNOSIS — Z471 Aftercare following joint replacement surgery: Secondary | ICD-10-CM | POA: Diagnosis not present

## 2017-12-17 ENCOUNTER — Other Ambulatory Visit: Payer: Self-pay | Admitting: Pulmonary Disease

## 2017-12-22 NOTE — Telephone Encounter (Signed)
Refill request for Ativan 1mg  tablet, to take 1/2 or 1 tablet three times daily for anxiety   Last OV: 09/18/2017 Next OV: 03/21/2018  Last ordered by : Dr. Lenna Gilford Quantity: 90tabs  SN please advise for refill  Allergies  Allergen Reactions  . Simvastatin Other (See Comments)     pt states INTOL to [facility] w/ leg pains    Current Outpatient Medications on File Prior to Visit  Medication Sig Dispense Refill  . acetaminophen (TYLENOL) 500 MG tablet Take 500 mg by mouth 3 (three) times daily as needed (for pain.).    Marland Kitchen ALPRAZolam (XANAX) 0.5 MG tablet Take 0.25-0.5 mg by mouth 3 (three) times daily as needed for anxiety.    Marland Kitchen amLODipine (NORVASC) 5 MG tablet Take 1 tablet (5 mg total) by mouth daily. (Patient taking differently: Take 5 mg by mouth at bedtime. ) 30 tablet 11  . gabapentin (NEURONTIN) 100 MG capsule TAKE 2 CAPSULES BY MOUTH EVERY NIGHT AT BEDTIME 60 capsule 0  . HYDROmorphone (DILAUDID) 2 MG tablet Take 1-2 tablets (2-4 mg total) by mouth every 4 (four) hours as needed for moderate pain or severe pain. 60 tablet 0  . lisinopril (PRINIVIL,ZESTRIL) 10 MG tablet TAKE 1 TABLET BY MOUTH EVERY DAY (Patient taking differently: TAKE 1 TABLET BY MOUTH EVERY DAY AT BEDTIME) 90 tablet 3  . LORazepam (ATIVAN) 1 MG tablet TAKE ONE-HALF TO 1 TABLET BY MOUTH THREE TIMES DAILY AS NEEDED FOR ANXIETY 90 tablet 5  . Magnesium 400 MG CAPS Take 400 mg by mouth daily.     . metFORMIN (GLUCOPHAGE XR) 500 MG 24 hr tablet Take 1 tablet (500 mg total) by mouth daily with breakfast. (Patient taking differently: Take 500 mg by mouth every evening. ) 30 tablet 11  . methocarbamol (ROBAXIN) 500 MG tablet Take 1 tablet (500 mg total) by mouth every 6 (six) hours as needed for muscle spasms. 60 tablet 0  . omeprazole (PRILOSEC) 40 MG capsule TAKE ONE CAPSULE BY MOUTH EVERY DAY 90 capsule 3  . Potassium 99 MG TABS Take 99 mg by mouth daily.    . pravastatin (PRAVACHOL) 80 MG tablet Take 1 tablet (80 mg total) by  mouth daily. (Patient taking differently: Take 80 mg by mouth every evening. ) 90 tablet 3  . rivaroxaban (XARELTO) 10 MG TABS tablet Take 1 tablet (10 mg total) by mouth daily with breakfast. Take Xarelto for two and a half more weeks following discharge from the hospital, then discontinue Xarelto. Once the patient has completed the Xarelto, they may resume the 81 mg Aspirin. 20 tablet 0  . traMADol (ULTRAM) 50 MG tablet Take 1-2 tablets (50-100 mg total) by mouth every 6 (six) hours as needed (mild pain). 56 tablet 0   No current facility-administered medications on file prior to visit.

## 2017-12-25 ENCOUNTER — Other Ambulatory Visit: Payer: Self-pay | Admitting: *Deleted

## 2017-12-25 MED ORDER — LORAZEPAM 1 MG PO TABS: ORAL_TABLET | ORAL | 3 refills | Status: DC

## 2017-12-28 ENCOUNTER — Ambulatory Visit: Payer: Medicare Other | Admitting: Pulmonary Disease

## 2018-01-18 ENCOUNTER — Other Ambulatory Visit: Payer: Self-pay | Admitting: Pulmonary Disease

## 2018-03-21 ENCOUNTER — Encounter: Payer: Self-pay | Admitting: Pulmonary Disease

## 2018-03-21 ENCOUNTER — Ambulatory Visit: Payer: Medicare Other | Admitting: Pulmonary Disease

## 2018-03-21 ENCOUNTER — Other Ambulatory Visit (INDEPENDENT_AMBULATORY_CARE_PROVIDER_SITE_OTHER): Payer: Medicare Other

## 2018-03-21 VITALS — BP 132/78 | HR 59 | Temp 98.0°F | Ht 70.0 in | Wt 177.4 lb

## 2018-03-21 DIAGNOSIS — G8929 Other chronic pain: Secondary | ICD-10-CM

## 2018-03-21 DIAGNOSIS — F411 Generalized anxiety disorder: Secondary | ICD-10-CM

## 2018-03-21 DIAGNOSIS — I10 Essential (primary) hypertension: Secondary | ICD-10-CM

## 2018-03-21 DIAGNOSIS — C7800 Secondary malignant neoplasm of unspecified lung: Secondary | ICD-10-CM

## 2018-03-21 DIAGNOSIS — I2510 Atherosclerotic heart disease of native coronary artery without angina pectoris: Secondary | ICD-10-CM

## 2018-03-21 DIAGNOSIS — M159 Polyosteoarthritis, unspecified: Secondary | ICD-10-CM

## 2018-03-21 DIAGNOSIS — M150 Primary generalized (osteo)arthritis: Secondary | ICD-10-CM

## 2018-03-21 DIAGNOSIS — E782 Mixed hyperlipidemia: Secondary | ICD-10-CM

## 2018-03-21 DIAGNOSIS — K219 Gastro-esophageal reflux disease without esophagitis: Secondary | ICD-10-CM

## 2018-03-21 DIAGNOSIS — E119 Type 2 diabetes mellitus without complications: Secondary | ICD-10-CM

## 2018-03-21 DIAGNOSIS — M545 Low back pain: Secondary | ICD-10-CM

## 2018-03-21 DIAGNOSIS — C61 Malignant neoplasm of prostate: Secondary | ICD-10-CM | POA: Diagnosis not present

## 2018-03-21 DIAGNOSIS — M8949 Other hypertrophic osteoarthropathy, multiple sites: Secondary | ICD-10-CM

## 2018-03-21 DIAGNOSIS — K5730 Diverticulosis of large intestine without perforation or abscess without bleeding: Secondary | ICD-10-CM

## 2018-03-21 LAB — CBC WITH DIFFERENTIAL/PLATELET
Basophils Absolute: 0 10*3/uL (ref 0.0–0.1)
Basophils Relative: 0.7 % (ref 0.0–3.0)
EOS ABS: 0.2 10*3/uL (ref 0.0–0.7)
Eosinophils Relative: 2.5 % (ref 0.0–5.0)
HCT: 38.9 % — ABNORMAL LOW (ref 39.0–52.0)
HEMOGLOBIN: 13.4 g/dL (ref 13.0–17.0)
LYMPHS ABS: 3 10*3/uL (ref 0.7–4.0)
Lymphocytes Relative: 41.7 % (ref 12.0–46.0)
MCHC: 34.6 g/dL (ref 30.0–36.0)
MCV: 91.2 fl (ref 78.0–100.0)
MONO ABS: 0.6 10*3/uL (ref 0.1–1.0)
Monocytes Relative: 8.3 % (ref 3.0–12.0)
NEUTROS PCT: 46.8 % (ref 43.0–77.0)
Neutro Abs: 3.4 10*3/uL (ref 1.4–7.7)
Platelets: 191 10*3/uL (ref 150.0–400.0)
RBC: 4.26 Mil/uL (ref 4.22–5.81)
RDW: 14.2 % (ref 11.5–15.5)
WBC: 7.2 10*3/uL (ref 4.0–10.5)

## 2018-03-21 LAB — COMPREHENSIVE METABOLIC PANEL
ALBUMIN: 4.4 g/dL (ref 3.5–5.2)
ALK PHOS: 62 U/L (ref 39–117)
ALT: 19 U/L (ref 0–53)
AST: 17 U/L (ref 0–37)
BUN: 23 mg/dL (ref 6–23)
CALCIUM: 9.5 mg/dL (ref 8.4–10.5)
CHLORIDE: 105 meq/L (ref 96–112)
CO2: 28 mEq/L (ref 19–32)
Creatinine, Ser: 0.82 mg/dL (ref 0.40–1.50)
GFR: 96.94 mL/min (ref >60.00)
Glucose, Bld: 133 mg/dL — ABNORMAL HIGH (ref 70–99)
Potassium: 4.7 mEq/L (ref 3.5–5.1)
Sodium: 141 mEq/L (ref 135–145)
Total Bilirubin: 0.8 mg/dL (ref 0.2–1.2)
Total Protein: 6.7 g/dL (ref 6.0–8.3)

## 2018-03-21 LAB — LIPID PANEL
CHOLESTEROL: 160 mg/dL (ref 0–200)
HDL: 42 mg/dL (ref >39.00)
LDL CALC: 80 mg/dL (ref 0–99)
NonHDL: 118.36
TRIGLYCERIDES: 194 mg/dL — AB (ref 0.0–149.0)
Total CHOL/HDL Ratio: 4
VLDL: 38.8 mg/dL (ref 0.0–40.0)

## 2018-03-21 LAB — PSA: PSA: 0.12 ng/mL (ref 0.10–4.00)

## 2018-03-21 LAB — HEMOGLOBIN A1C: Hgb A1c MFr Bld: 6.9 % — ABNORMAL HIGH (ref 4.6–6.5)

## 2018-03-21 LAB — TSH: TSH: 0.74 u[IU]/mL (ref 0.35–4.50)

## 2018-03-21 NOTE — Patient Instructions (Signed)
Today we updated your med list in our EPIC system...    Continue your current medications the same...  Today we did your f/u fasting blood work...    We will contact you w/ the results when available...   We discussed my [email or web address] retirement & recommendation for [facility] in [place]    ([email or web address] your computer)  Call for any questions or if I can be of service in any way...  It has been my great honor to have been your doctor over these many yrs!!!

## 2018-03-23 ENCOUNTER — Encounter: Payer: Self-pay | Admitting: Pulmonary Disease

## 2018-03-23 NOTE — Progress Notes (Signed)
Subjective:     Patient ID: [name], male   DOB: Feb 14, 1941, 77 y.o.   MRN: [id number]  HPI 77 y/o WM here for a follow up visit... he has multiple medical problems as noted below... [name] was [name] & [name] in the 1960s! ~  SEE PREV EPIC NOTES FOR OLDER DATA >>   ~  February 05, 2013:  38yrROV>  VDomonikreturns after a long hiatus- being cared for by [name] [employer] Cards, & [name] for Urology;  He has numerous medical issues- addressed below, but returns asking for a nerve pill describing lots of stress w/ his business etc; we discussed trial Alpraz0.'5mg'$  tid as needed...     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    HBP> on Aten25, Lisin10;  BP= 142/78 & wt=199# (same as 2011); he denies CP, palpit, SOB, dizzy, edema, etc...    CAD> rec to take ASA81; followed by [name] & last seen 10/13- stable, no changes made...    Dyslipidemia> on Prav20;  FLP 6/14 showed TChol 169, TG 242, HDL 31, LDL 93 & we reviewed diet, exercise, & rec adding FENOFIBRATE '160mg'$ /d...     GI- GERD, Divertics> on Omep40;  He denies abd pain, dysphagia, n/v, c/d, blood seen; last colon by [name] was 2004 & he is due for f/u procedure...     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 We reviewed prob list, meds, xrays and labs> see below for updates >>   LABS 6/14:  FLP- chol ok but TG=242 HDL=31;  Chems- wnl;  CBC- wnl;  TSH=1.28;  PSA=3.21 (prev in 2011 it was 0.57)...  ~  August 13, 2013:  97m77moV &  [name] has gained 9# up to 208# today, he blames it on quitting chewing tobacco but I told him the trade-off was worth it, asked to get on diet & incr exercise... He has no new complaints or concerns...    Breathing is stable but needs to incr exercise program...    BP is controlled w/ Aten25 & Lisin10;  BP=128/78 today & he denies CP, palpit, SOB, edema...    He saw [name] 10/14- on above + ASA; stable & asymptomatic, no changes made, f/u 85yr84yr   Lipids treated w/ Prav20 & diet but he's gained wt as noted; intol to Fenofib previously- we reviewed diet, exercise, wt reduction strategies...    He saw [name] for prostate Ca follow up 8/14> treated w/ XRT but slowly rising PSA since then, they are on Observation protocol w/ check ups every 97mo.67moe reviewed prob list, meds, xrays and labs> see below for updates >> Given PREVNAR-13 & Rx for Shingles vaccine...   EKG 10/14 by [name] showed SBrady, rate52, borderline EKG, NAD...   ADDENDUM> PSA 2/15 by [name] was 4.26...   ~  February 10, 2014:  97mo R55mond [name] that he is doing well, no new complaints or concerns;  He has a rising  PSA- followed by [name] Q67mow/ PSADT est ~184mo last measured 4.26 in [id number] He saw Urology 3/15 & note reviewed (prev hx well outlined) and plan was to continue monitoring Q6m38motil PSA ~10 then consider hormone therapy...  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    HBP> on Aten25, Lisin10;  BP= 132/82 & wt=203# (down 5# last 17mo43moe  denies CP, palpit, SOB, dizzy, edema, etc...    CAD> on ASA81; followed by [name] & last seen 10/14- remains stable, no changes made, f/u 54yr.22yr  Dyslipidemia> on Prav20; hx intol to Fenofibrate; FLP 6/14 showed TChol 169, TG 242, HDL 31, LDL 93 & we reviewed diet, exercise, & wt reduction...    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 We reviewed prob list, meds, xrays and labs> see below for updates >>   CXR 7/15 shows norm heart size & Ao atherosclerosis, bilat pulm nodules are visualized (new since 2/11 CXR) w/ largest ~2cm in LUL...    LABS 7/15:  FLP- not at goals w/ TG elev & HDL low;  Chems- ok x BS=135;  CBC- wnl;  TSH=1.38;  PSA=6.15...  CT Chest/ Abd/ Pelvis 7/15 showed numerous bilat pulm nodules; no signif findings in abd/pelvis x gallstones, adrenal adenoma, scat divertics, and atherosclerotic changes; degen changes in spine PLAN>  CXR shows recurrent lung nodules (assoc w/ his rising PSA); prev noted nodules from 2007 were assoc w/ PSA of 10.6 at time of Dx, and the CXR cleared w/o nodules seen in 2008, 2009, 2011; he has been followed by Urology w/ slowly rising PSA at 3.1 last yr and 4.26 earlier this yr, and now=6.15   With f/u CXR showing recurrent mult bilat pulm nodules (also seen at time of Prostate cancer Dx in 2007 & resolved (2008, 2009, 2011) after XRT & hormone therapy at time of dx...   PET Scan 02/20/14 showed numerous bilat  noncalcif pulm nodules, 5 measured w/ max SUV 1.6-4.6, coronary atherosclerosis, no skeletal metssmall right adrenal nodule (adenma favored)...  Nuclear Bone Scan 02/27/14 showed focal areas of uptake in spine (most likely degenerative) & knee/ shoulder- no convincing evid for mets to bone...  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  PLAN>  He has upcoming appt w/ [name] for Urology   ~  June 18, 2014:  126moROV & VMasayukisaw [name] 9/15 who agreed w/ our Dx of an unusual case of Prostate Cancer w/ mult lung metastasis; they started Rx w/ Lupron (planning shots Q434moand used Casodex for the 1st 30d only; he has f/u appt in Jan2016 w/ Urology for PSA & Testos levels; by his account he has been tolerating the Rx well w/o new issues until last week...  He states that on 11/23 he had a "head cold & chest congestion" so he went to the local [facility] & was evaluated & given Omnicef & a steroid shot in right buttock; on 11/24 he awoke w/ pain in his right leg (?hip area & thigh area mostly) & states he "couldn't walk" due to the pain & numbness; on 11/25 he went to his Chiro w/ XRays done (later told they showed "alot of issues in my lower back"); he has an inversion table he's used on & off for yrs but hasn't been on it recently; 11/27 was Thanksgiving & on 11/28 [facility] XRays of Lumbar sp w/ extensive degen changes, XRay of right hip w/ min degen arthritis, VenDopplers neg for DVT, no rash presenty, sl tender right hip but good ROM> they told him likely siatica- given Pred taper & Percocet5 which he has taken ~3/d & today notes sl improved...  Still no rash present & exam is unchanged from what is described- min tender, good ROM, essent neg SLR, symmetric reflexes and strength; note- [facility]...  Daughter  requests that he see DrLucey for Ortho eval & we will try to expedite this ASAP for pt...     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     Prostate Cancer w/ lung mets> 9/15 OV by [name] reviewed; pt was started on Lupron shots (planned Q4mo326mogiven Casodex x30d to start; pt has f/u visit sched for [id number] labs (PSA & Testos) and 2nd Lupron injection... We reviewed prob list, meds, xrays and labs> see below for updates >>   CXR 12/15 showed signif improvement w/ decr in size & number of nodules... PLAN>> now w/ new onset of right leg pain- primarily right thigh area w/ some burning as well; no rash present & asked to watch for vesicular eruption & call if any changes; this is the vicinity of meralgia paresthetica & we discussed trial GABAPENTIN start 100mg626m; continue Pred taper given in ER and refilled the Oxycodone5 Tid + constip prophylactic regime w/ Miralax daily & Senakot-S Qhs... Plan ROV 6wks...  ~  July 30, 2014:  6wk ROV & VanceArkeems me he went to an Ortho in [place] who did MRI and referred him to NS- DrJenkins, told him he could do surg but advised holding off since his pain level is diminished, still taking the Neurontin100Tid & Oxycodone5 prn; we do not have notes from these consultants & he is asked to get records sent to us to[place]ac[place] into Epic...     VanceEyoel/o a URI w/ cough, beige sput, sinus drainage, & chest soreness from the cough; he denies f/c/s, denies incr SOB, etc; we decided to treat w/ ZPak, Hycodan, Mucinex, fluids...     He has f/u appt w/ Urology, [name] soon; as noted his last CXR 12/15 showed signif improvement w/ decr  in size & number of nodules (believed to be metastatic prostate cancer)...  We reviewed prob list, meds, xrays and labs> see below for updates >>  PLAN>> we decided to treat w/ ZPak, Hycodan, Mucinex, fluids; he will f/u in 70mow/ repeat  CXR...  ~  Nov 28, 2014:  428moOV & VaDanielaeports that is breathing is good, no problems reported- denies cough, sput, SOB, CP, etc;  His CC remains LBP- pt says MRI reveqaled bulging discs & he is sched for an epid steroid shot per DrJenkins (we still do [place]. He continues to f/u w/ Urology- [name] & pt indicates that he's due for lab work next week & a hormone shot after that- again he is asked to request records be sent to usKorearom his specialists for usKore[place] review & so these records will be avail in EPIC...  EXAM reveals Afeb, VSS, O2sat=99% on RA;  HEENT- neg, mallampati2;  Chest- clear w/o w/r/r;  Heart- RR w/o m/r/g;  Ext- neg w/o c/c/e...   CXR 5/16 shows regression of the bilat lung nodules & now resolved, norm heart size, NAD...  PLAN>>  VaArgeniss stable from the pulmonary standpoint; he has mult specialists attending his various problems & he is reminded to get records sent to us[place]ore[place] review & scan into Epic...   ~  June 01, 2015:  15m70moV & VanBobports that he is doing well- he remains on Lupron shots per [name]- last seen 04/13/15 & note reviewed- he's had a great response to the Lupron w/ PSA falling to zero & pulmonary metastatic prostate cancer nodules melting away; tolerating the shots well...  He had a ruptured bicep tendon in right arm repaired by DrKuzma 02/2015... 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    HBP> on Aten25, Lisin10;  BP= 132/84 & wt=203# (stable); he denies CP, palpit, SOB, dizzy, edema, etc...     CAD> on ASA81; followed by [name] & last seen 10/15- remains stable, no changes made, overdue for yearly f/u visit w/ Cards...    Dyslipidemia> on Prav20; hx intol to Fenofibrate; FLP 11/16 showed TChol 169, TG 292, HDL 34, LDL 81 & we reviewed better diet, exercise, & wt reduction...    GI- GERD, Divertics> on Omep40;  He denies abd pain, dysphagia, n/v, c/d, blood seen; last colon by [name] was 8/14 & showed mod divertics, no polyps, and he rec f/u colon for routine risk ~10y87yr Hx Prostate Cancer w/ lung mets> followed by [name] w/ hx prostate ca 2007 w/ IMRT & hormone therapy x2yrs33yr has had slowly rising PSA= 3.21 [id number]= 4.26 [id number]= 6.15 Jul2015=> Dx w/ metastatic ca to lungs; treated by [name] w/ Lupron shots and PSA ret to zero w/ resolution of the pulm nodules...    DJD> on OTC analgesics as needed; he has seen [name] in the past for left knee pain...    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 EXAM  reveals Afeb, VSS, O2sat=98% on RA;  HEENT- neg, mallampati2;  Chest- clear w/o w/r/r;  Heart- RR w/o m/r/g;  Abd- soft, nontender, neg;  Ext- neg w/o c/c/e...   LABS 05/2015>  FLP- ok on Prev20 x TG=292;  Chems- ok x BS=147;  CBC- wnl;  TSH=1.41... PSAs checked by [name] 7 reported to be= zero... IMP/PLAN>>  Abubakar is stable on current regimen;  Given 2016 Flu vaccine; rec to continue same and we plan ROV in 77mo..  ~  December 29, 2015:  665moOV & VaAverilleports doing well overall;  He tells me that he has several friends on Gabapentin for back pain & he wants to try it if at all poss- we discussed this med & agreed to Rx w/ 10033mhs=> incr to 200m64m tolerated;  See prob list above...     BP/ CAD well controlled on ASA, Aten25 & Lisin10; BP=134/80 & he denies HA, visual sx, CP, palpit, SOB, edema, etc...    He remains on Prav20 + FishOil and FLP 11/16 showed elev TG- advised same med, better low fat diet,  lose 10 lbs...    He has been trying to control his DM w/ diet alone- but labs today showed BS=132, A1c=7.4 & we decided to start METFORMIN 500mg57m...    He has hx of prostate Ca w/ lung mets- followed by [name] & last seen ~6wks ago- on Lupron shots and pt reports that PSA remains zero!    VanceToussaintDJD, LBP/ bulging discs on Percocet10 prn, using sparingly & ave 1/wk; he tells me that he takes Aleve Bid & wants to try the Gabapentin as above... EXAM reveals Afeb, VSS, O2sat=96% on RA;  HEENT- neg, mallampati2;  Chest- clear w/o w/r/r;  Heart- RR w/o m/r/g;  Abd- soft, nontender, neg;  Ext- neg w/o c/c/e...   CXR 12/29/15 showed norm heart size, clear lungs w/o nodules, Tspine degen changes, NAD...  LABS 12/29/15>  Chems- ok x BS=132;  A1c=7.4... IMMarland KitchenMarland KitchenPLAN>>  VanceSailortable overall w/ his mult medical issues- we decided to start METFORM500 Qam & reviewed low carb diet, need for wt reduction;  We also started trial Gabapentin per his request for back issues;  We plan ROV 79mo, 9moer if needed prn...  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 Jeff JenEarle Gellhiro, told bulging discs & old XRays shHoover Brunetteextensive degen changes in lumbar spine; he had prev [place] in [place] & DrJenkins told him he could do surg but advised holding off since his pain at that time was diminished; Pt recently requested trial of Gabapentin (just like several friends) & now requests f/u appt w/ neurosurg... 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    HBP> on Aten25, Lisin10;  BP= 130/84 & wt=194# (BMI=27); he denies CP, palpit, SOB, dizzy, edema, etc...    CAD> on ASA81; followed by [name] & last seen 10/15- remains stable, no changes made, & overdue for f/u visit w/ Cards...    Dyslipidemia (mixed)> on Prav20; hx intol to Fenofibrate; FLP 12/17 shows TChol 186, TG 251, HDL 40, LDL 96 & we reviewed better low carb/ low fat diet, exercise, & wt reduction...    GI- GERD, Divertics> on Omep40;  He denies abd pain, dysphagia, n/v, c/d, blood seen; last colon by [name] was 8/14 & showed mod divertics, no polyps, and he rec f/u colon for routine risk ~61yr    Hx Prostate Cancer w/ lung mets> followed by [name] w/ hx prostate ca 2007 w/ IMRT & hormone therapy x253yr he has had slowly rising PSA= 3.21 [id number]= 4.26 [id number]= 6.15 Jul2015=> Dx w/ metastatic ca to lungs; treated by [name] w/ Lupron shots and PSA ret to zero w/ resolution of the pulm nodules...    DJD> on OTC analgesics as needed; he has seen [name] in the past for left knee pain, DrJJenkins and Chiro for LBP w/ prev MRI in [place] (?results); he presented 12/17 w/ worsening/ persistent LBP & requesting NS referral => DrJenkins booked for 73m22morequested us [place] check Lumbar MRI w/ copy to him; Rx w/ rest/ heat/ Gabapentin/ Pred dosepak...    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 EXAM reveals Afeb, VSS, O2sat=96% on RA;  HEENT- neg, mallampati2;  Chest- clear w/o w/r/r;  Heart- RR w/o m/r/g;  Abd- soft, nontender, neg;  Back- sl tender, decr  ROM, abn SLR;  Ext- neg w/o c/c/e;  Neuro- w/o deficits...  LABS 06/29/16>  FLP- not at goals w/ TG=251;  Chems- ok x BS=138, A1c=6.9 on diet alone & he doesn't want meds (asked to really get on diet etc);  CBC- wnl;  TSH= 1.41  MRI Lumbar spine => pending IMP/PLAN>>  VancHastonts to control his DM w/o meds & we reviewed the diet/ exercise/ wt reduction required!  His FLP is not at goal either & diet is the key here to reduction in the TGs;  His CC is LBP & we discussed rest, heat, Pred dosepak & refer to NS DrJJenkins (his office called & no appt for 73mo-37mo to get new MRI Lumbar & get results to them);  OK 2017 FLU vaccine today...  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   ~  December 28, 2016:  20mo R49mo [name] Matthants stable- "just my back" meaning his LBP, worse w/ activity etc; he has seen [name] for Ortho, DrJenkins for NS, and a chiro; he tells me [place] we set him up to see DrJenkins Neurosurg & he wanted us to [place] 1st- this was done 06/2016 w/ progressive spondylosis L2-3 w/ bulging disc & nerve root compression in the thecal sac=> I do not have recent notes from him; pt reports "shot" w/o much relief prev;  We discussed Tramadol + Tylenol Rx...  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    HBP> on  Aten25, Lisin10;  BP= 132/70 & wt=194# (BMI=27); he denies CP, palpit, SOB, dizzy, edema, etc...    CAD> on ASA81; followed by [name] & last seen 10/15- remains stable, no changes made, & overdue for f/u visit w/ Cards...    Dyslipidemia (mixed)> on Prav20; hx intol to Fenofibrate; FLP 12/17 shows TChol 186, TG 251, HDL 40, LDL 96 & we reviewed better low carb/ low fat diet, exercise, & wt reduction...    GI- GERD, Divertics> on Omep40;  He denies abd pain, dysphagia, n/v, c/d, blood seen; last colon by [name] was 8/14 & showed mod divertics, no polyps, and he rec f/u colon for routine risk ~18yr    Hx Prostate Cancer w/ lung mets> followed by [name] w/ hx prostate ca 2007 w/ IMRT & hormone therapy x216yr he has had slowly rising PSA= 3.21 [id number]= 4.26 [id number]= 6.15 Jul2015=> Dx w/ metastatic ca to lungs; treated by [name] w/ Lupron shots and PSA ret to zero w/ resolution of the pulm nodules (getting Lupron Q6m59mo.    DJD> on OTC analgesics as needed; he has seen [name] in the past for left knee pain, DrJJenkins and Chiro for LBP w/ prev MRI in [place] (?results); he presented 12/17 w/ worsening/ persistent LBP & requesting NS referral => DrJenkins booked for 44mo45moequested us [place] Lumbar [place] w/ copy to him; Rx w/ rest/ heat/ Gabapentin/ Pred dosepak...    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 EXAM reveals Afeb, VSS, O2sat=96% on RA;  HEENT- neg, mallampati2;  Chest- clear w/o w/r/r;  Heart- RR w/o m/r/g;  Abd- soft, nontender, neg;  Back- sl tender, decr ROM, abn SLR;  Ext- neg w/o c/c/e;  Neuro- w/o deficits...  LABS 12/28/16>  Chems- ok w/ BS=137, Cr=0.79;  HgA1c=7.1 and we discussed MetformER '500mg'$  Qam... IMP/PLAN>>  [name]'s CC is his back pain, under the care of NS- DrJenkins and he is awaiting f/u visit to discuss options;  We will Rx w/ Tramadol50 + tylenol;  Labs showed worsening BS/ A1c=> mild DM & rec to  start MetformER500;  He continues to f/u w/ NS & Urology;  Cardiac stable w/o CP, palpit, etc...   ~  June 29, 2017:  14mo 49mo& VanceEloiserts a good interval w/ CC= back & left leg pain w/ f/u neurosurg appt sched for next week...  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     He saw UROLOGY-[name] 05/01/17>  F/u prostate cancer; on androgen depriv therapy since 2015, PSAs have been ~zero; he had pulm metastatic dis, neg bone scan, getting Lupron Q14mo,40mo  DEXA was ok in 2017, CXR remains clear, no mets in lumbar spine;  Labs 04/24/17 showed PSA=0.10 & Testos=12.7;  Given Lupron '45mg'$  IM...  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    HBP> on Aten25, Lisin10;  BP= 128/78 & wt=180# down 14# (BMI=25); he denies CP, palpit, SOB, edema, but has had some dizziness esp when he changes positions from supine to standing=> stop Aten25 & start Amlod5...    CAD> on ASA81; followed by [name] & last seen 10/15- remains stable, no changes made, & overdue for f/u visit w/ Cards...    Dyslipidemia (mixed)> on Prav20; hx intol to Fenofibrate; FLP 12/17 shows TChol 186, TG 251, HDL 40, LDL 96 & we reviewed better low carb/ low fat diet, exercise, & wt  reduction...    DM> Labs 12/17 showed BS=138 & A1c=6.9- he wanted diet alone; recheck labs 6/18 showed BS=137 & HgA1c=7.1=> started on MetformER500; f/u labs 12/18 showed BS=115, A1c=6.3 but he c/o diizy & we changed dose to eve.    GI- GERD, Divertics> on Omep40;  He denies abd pain, dysphagia, n/v, c/d, blood seen; last colon by [name] was 8/14 & showed mod divertics, no polyps, and he rec f/u colon for routine risk ~97yr    Hx Prostate Cancer w/ lung mets> followed by [name] w/ hx prostate ca 2007 w/ IMRT & hormone therapy x249yr he has had slowly rising PSA= 3.21 [id number]= 4.26 [id number]= 6.15 Jul2015=> Dx w/ metastatic ca to lungs; treated by [name] w/ Lupron shots and PSA ret to zero w/ resolution of the pulm nodules (getting Lupron Q6m26mo.    DJD> on OTC analgesics as needed; he has seen [name] in the past for left knee pain, DrJJenkins and Chiro for LBP w/ prev MRI in [place] (?results); he presented 12/17 w/ worsening/ persistent LBP & requesting NS referral => DrJenkins booked for 38mo72moequested us tKoreacheck Lumbar MRI w/ copy to him; Rx w/ rest/ heat/ Gabapentin/ Pred dosepak...    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 EXAM reveals Afeb, VSS, O2sat=95% on RA;  HEENT- neg, mallampati2;  Chest- clear w/o w/r/r;  Heart- RR w/o m/r/g;  Abd- soft, nontender, neg;  Back- sl tender, decr ROM, abn SLR;  Ext- neg w/o c/c/e;  Neuro- w/o deficits...  CXR 06/29/17 (independently reviewed by me in the PACS system) showed norm heart size, clear lungs- NAD  EKG 06/29/17>  SBrady, rate55, 1st degree AVB, otherw wnl...   LABS 06/2017>  Chems- ok w/ BS=115, Cr=0.83, A1c=6.3...  Marland KitchenMarland KitchenP/PLAN>>  We decided to stop his Aten25 & try Amlod5;  Switch the MetformER from AM to PM at dinner; ok flu shot today...  ~  September 18, 2017:  78mo 79mo& pre-op medical clearance>  VanceKijuanched for L-THR, we don't have notes from Ortho & his CC has been  LBP; his pain doc is DrBartko who gave him shots in his back & did hip XRays & found it bone  on bone=> DrAlusio planning THR...  SEE PROBLEM LIST ABOVE (06/29/17 OV)- we reviewed problems and meds w/ pt...     HBP- Aten25 was changed to [facility] due to dizziness & this symptom resolved...  EXAM reveals Afeb, VSS, O2sat=98% on RA;  HEENT- neg, mallampati2;  Chest- clear w/o w/r/r;  Heart- RR w/o m/r/g;  Abd- soft, nontender, neg;  Back- sl tender, decr ROM, abn SLR;  Ext- neg w/o c/c/e;  Neuro- w/o deficits...  LABS 09/19/17>  FLP- ok w/ parameters at goals x TG=189;  Chems- ok x K=5.2, BS=131, A1c=6.6, Cr=0.80, LFTs wnl;  CBC- wnl w/ Hg=13.7, WBC=7.0;  TSH=2.09;  PSA=0.13 on Lupron per Urology. IMP/PLAN>>  Pre-op eval for planned L-THR by drAlusio; Jailan is OK for surg, Labs are satis, CXR 12/2018was satis- no acute cardiopulm dis, neg for nodules, & EKG 12/18 showed SBrady at 55/min 1st degree AVB otherw wnl; prostate ca under control now w/ Lupron...   ~  March 21, 2018:  629moROV              Problem List:    BRONCHITIS, RECURRENT (ICD-491.9) - no recent URI symptoms... 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 ~  CXR 2007 when coronary stent placed showed ?sm nodules seen... ~  CT Chest 8/07 showed bilat noncalcif lung nodules, varying size in all lobes & largest= 9.450min LLL, no adenopathy... note: PSA=10.6 ~  CT Abd&Pelvis 9/07 showed mult sm lung nodules at bases bilat, gallstones, sm duod divertic, scat atherosclerotic calcif, no mass or adenop, asymmetric enlarged prostate...  ~  CT Chest 12/07 showed mult pulm nodules bilat- no change, no adenopathy, coronary calcif...  ~  f/u CXR 8/08 without nodules seen... (note: PSA= 0.03) ~  f/u CXR 11/09 clear, no lesions seen... ~  f/u CXR 2/11 showed clear, NAD...Marland Kitchen(note: PSA= 0.57) ~  CXR 7/15 shows norm heart size & Ao atherosclerosis, bilat pulm nodules are  visualized (new since 2/11 CXR) w/ largest ~2cm in LUL... 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 ~  CXR 12/15 (after Lupron shot & 29m93mo Casodex) showed signif improvement w/ decr in size & number of nodules ~  1/16: he returned w/ URI- cough, sput, chest soreness and we decided to treat w/ ZPak, Hycodan, Mucinex, fluids... ~  CXR 5/16 shows resolution of the prev metastatic nodules, heart size is wnl, NAD... Marland Kitchen~  CXR 6/17 showed norm heart size, clear lungs w/o nodules, Tspine degen changes, NAD...  HYPERTENSION (ICD-401.9) - contolled on ATENOLOL '25mg'$ /d, & LISINOPRIL '10mg'$ /d...  ~  9/11:  BP=110/62 here and usually 120s/ 70s at home- denies HA, fatigue, visual changes, CP, palipit, dizziness, syncope, dyspnea, edema, etc... ~  7/14:  on Aten25, Lisin10;  BP= 142/78 & wt=199# (same as 2011); he denies CP, palpit, SOB, dizzy, edema, etc. ~  7/15: on Aten25, Lisin10;  BP= 132/82 & wt=203# (down 5# last 3mo48moe remains asymptomatic... ~  12/15: on Aten25, Lisin10; BP= 140/90 & wt=202#; he denies CP, palpit, SOB, edema... ~  5/16: on Aten25, Lisin10; BP= 140/90 & he is asymptomatic... Reminded to elim sodium. ~  6/17-12/17:  well controlled on ASA, Aten25 & Lisin10; BP=130/80 range & he denies HA, visual sx, CP, palpit, SOB, edema, etc;...  CAD (ICD-414.00) - followed by [name]...Marland KitchenMarland Kitchen  on ASA '325mg'$ /d. ~  NuclearStressTest 8/07 abnormal w/ ant ischemia... ~  cath 8/07 showed 95% mid-LAD stenosis, & 20% lesions in the other 2 vessels, EF=60%... subseq PTCA w/ non-drug eluting stent... ~  OV w/ Cards= 5/10 & note reviewed- BP sl elevated & Lisinopril added. ~  He saw [name] 10/13> doing well, no new symptoms, exam was neg, no change in meds & rec aggressive risk factor reduction strategy...  ~  EKG 10/13 showed NSR, rate62, LAD, otherw wnl, NAD... ~  He saw [name] 10/14- on above + ASA;  stable & asymptomatic, no changes made, f/u 72yr~  He saw [name] 10/15> HBP, CAD, HL- stable w/o CP/ palpit/ SOB/ edema/ etc (despite being diagnosed w/ met prostate ca to lungs)...  DYSLIPIDEMIA (ICD-272.4) - on PRAVACHOL '20mg'$ /d and tol OK...  ~  FRed [place]8/08 showed TChol 168, TG 143, HDL34, LDL105 ~  FLP 2/09 showed TChol 163, TG 207, HDL 34, LDL 91 ~  FLP 5/10 showed TChol 148, TG 131, HDL 33, LDL 89 ~  FLP 9/11 showed TChol 163, TG 140, HDL 42, LDL 93 ~  FLP 6/14 on Prav20 showed TChol 169, TG 242, HDL 31, LDL 93  ~  FLP 7/15 on Prav20 showed TChol 174, TG 290, HDL 31, LDL 87... We reviewed low fat diet & need for wt reduction. ~  FLP 11/16 on Prav20 showed TChol 169, TG 292, HDL 34, LDL 81... Ditto ~  F[place]12/17 on Prav20 showed TChol 186, TG 251, HDL 40, LDL 96... Needs better low carb/ low fat/ wt reducing diet.  IFG=> Diabetes Mellitus> ~  Hx BS betw 130-150 on diet alone... ~  6/17:  Labs showed BS=132, A1c=7.4 and we decided to start METFORMIN '500mg'$  Qam => pt took it for 150mo stopped on his own for dizziness. ~  12/17:  Labs showed BS=138, A1c=6.9; he wants to control on diet alone- we reviewed low carb/ low fat, wt redcing diet...  GERD (ICD-530.81) - prev on OMEP20 but causes diarrhea he says, and ACIPHEX '20mg'$ /d works better...  ~  last EGD ([name])was 8/04 showing GERD & stricture dilated... ~  5/11: presented to GI w/ recurrent dysphagia- EGD showed 3cmHH, stricture dilated, no Barrett's, changed to Aciphex. ~  On Omep40 & he remains asymptomatic w/o dysphagia, CP, abd pain, n/v, c/d, blood seen...  DIVERTICULOSIS OF COLON (ICD-562.10) >>  ~  Colonoscopy 8/04 by [name] was normal, f/u 1063yrscattered tics seen on 1996 flex) ~  7/14:  He is due for f/u colon 7 we will refer the chart to [name] for review... ~  Colonoscopy 8/14 by [name] showed mod divertics, no polyps, and he rec f/u colon for routine risk ~10y66yr  PROSTATE CANCER (ICD-185) - followed  by DrPeterson- elevated PSA found 8/07 (10.6)... biopsies were pos (Stage T2b, bilat dis w/ Gleason4+3=7 on one side & 8 on the other) & there is a family hx as well; he had bilat pulm nodules on CXR & CT Chest at diagnosis... [facility], then hormonal Rx for 2 yrs... [facility] prev on TRELSTAR injections q 3mon29monthROVERA for the hot flashes>> x2yrs;33yrg bone scan 1/08;  PSA here= 0.03 in 2008, and CXR cleared w/o nodules seen in 2008, 2009, 2011 (see above)... ~  2/11: we don't have recent note from DrPeteLaSallewill request info sent to us at [place] visit. ~  9/11: labs here showed PSA=  0.57 & we will forward to [place] ~  9/13:  He had f/u visit w/ [name]> PSA had been slowly rising and ~1 when they last checked; [name] rec Q33mof/u  ~  7/14:  Pt missed his 3/14 appt w/ Urology & is sched to see them 8/14; PSA recorded 6/14 = 3.21 & we sent copy to [name]... ~  3/15:  He had f/u [name]> prostate Ca dx 2007, he got 2nd opinion at [facility] started on hormone therapy (x242yr & IMRT in 2008; slowly rising PSA w/ doubling time ~1253mow; [name] follows pt Q40mo54molan is for hormone therapy when PSA>10... ~  7/15:  Routine CXR showed return of bilat pulm nodules assoc w/ PSA= 6.15  (CT Chest, Abd, Pelvis is planned & discussion w/ [name] & Oncology)=> Dx w/ metastatic prostate adenocarcinoma (lung mets) ~  9/15: he was started on Lupron shots Q4mo,51moen Casodex daily x30d per [name]; f/u OV planned [id number]  12/15: CXR showed improvement w/ decr in size & number of lung nodules... He has a follow up appt w/ [name] in Jan2016. ~  He remains under the care of [name] for Urology Q40mo- 540moUPRON shots, c/o some hot flashes, Labs show PSA<0.04 & Testos low at 32  DEGENERATIVE JOINT DISEASE (ICD-715.90) >>  RIGHT THIGH AREA PAIN 12/15 >> ?etiology, [facility] & [facility]... ~  his left knee gives him some pain on and off; he saw [name] for this 12/08 & he thought there might be a torn meniscus, they decided to Rx w/ NAPROSEN which helps; may yet need MRI/ arthroscopy... ~  2/11: discussed trial of Mobic to see if this works for him. ~  12/15: presented w/ right thigh area pain ?[facility]iology [facility] w/ extensive degen changes in lumbar spine; sl improved w/ Pred & [facility]ocet [facility], ?could this be meralgia paresthetica? They want to see DrLucey for Ortho eval, we will give trial GABAPENTIN100Tid, watch for rash (he had Shingles vaccine 2 mo ago)... ~  1/16: Buzz Chidera me he went to an Ortho in [place] who did MRI and referred him to NS- DrJenkins, told him he could do surg but advised holding off since his pain level is diminished, still taking the Neurontin100Tid & Oxycodone5 prn; we do not have notes from these consultants & he is asked to get records sent to us to [place] into Epic. ~  6/17:  Tarick Durene Fruitsts that he has several frinds on Gabapentin w/ relief of back pain & he requests a trial- OK start w/ '100mg'$  Qhs & incr to '200mg'$  if tol... ~  12/17:  Pt c/o LBP & tried Neurontin w/o relief, Ortho in [place], prev chiro w/o relief => requests referral to NS, DrJJenkins but no openings for months so we will check Lumbar MRI 7 rx w/ rest, heat, Pred dosepak...  HEALTH MAINTENANCE >>  ~  GI>  [facility]- f/u due now... ~  GU>  Followed by [name] & seen every 40mo fo55moA recheck but he missed the 3/14 appt & our PSA 6/14= 3.21 (copy to Urology & he has appt 8/14)... ~  Immuniz>  He gets the yearly flu vax (last 10/13);  Had Pneumovax several yrs ago;  He received the PREVNAR-13 vax 1/15; Not sure of last Tetanus vaccine; Asking about the shingles shot=> Rx written & reveived  shot from [facility] 10[facility]15...   Past Surgical History:  Procedure  Laterality Date  . CARDIAC CATHETERIZATION  2007  . COLONOSCOPY  03/13/2013  . DISTAL BICEPS TENDON REPAIR Right 03/12/2015   Procedure: REPAIR/RECONSTRUCTION RIGHT BICEPS TENDON;  Surgeon: Daryll Brod, MD;  Location: [facility];  Service: Orthopedics;  Laterality: Right;  . ESOPHAGOGASTRODUODENOSCOPY  2011  . eyelid surgery for Lid Lag    . FOOT SURGERY    . HYDROCELE EXCISION / REPAIR    . INGUINAL HERNIA REPAIR     77 yrs old  . MASTECTOMY Left    benign tumor  . Prostate treatment     cancer  . SKIN CANCER EXCISION  2012   skin cancer on right ear  . TOTAL HIP ARTHROPLASTY Left 10/23/2017   Procedure: LEFT TOTAL HIP ARTHROPLASTY ANTERIOR APPROACH;  Surgeon: Gaynelle Arabian, MD;  Location: [facility];  Service: Orthopedics;  Laterality: Left;    Outpatient Encounter Medications as of 03/21/2018  Medication Sig  . acetaminophen (TYLENOL) 500 MG tablet Take 500 mg by mouth 3 (three) times daily as needed (for pain.).  Marland Kitchen ALPRAZolam (XANAX) 0.5 MG tablet Take 0.25-0.5 mg by mouth 3 (three) times daily as needed for anxiety.  Marland Kitchen amLODipine (NORVASC) 5 MG tablet Take 1 tablet (5 mg total) by mouth daily. (Patient taking differently: Take 5 mg by mouth at bedtime. )  . gabapentin (NEURONTIN) 100 MG capsule TAKE 2 CAPSULES BY MOUTH EVERY NIGHT AT BEDTIME  . lisinopril (PRINIVIL,ZESTRIL) 10 MG tablet TAKE 1 TABLET BY MOUTH EVERY DAY (Patient taking differently: TAKE 1 TABLET BY MOUTH EVERY DAY AT BEDTIME)  . Magnesium 400 MG CAPS Take 400 mg by mouth daily.   . metFORMIN (GLUCOPHAGE XR) 500 MG 24 hr tablet Take 1 tablet (500 mg total) by mouth daily with breakfast. (Patient taking differently: Take 500 mg by mouth every evening. )  . methocarbamol (ROBAXIN) 500 MG tablet Take 1 tablet (500 mg total) by mouth every 6 (six) hours as needed for muscle spasms.  Marland Kitchen omeprazole (PRILOSEC) 40 MG capsule TAKE ONE CAPSULE BY MOUTH EVERY DAY  . Potassium 99 MG TABS Take 99 mg by mouth daily.  .  pravastatin (PRAVACHOL) 80 MG tablet Take 1 tablet (80 mg total) by mouth daily. (Patient taking differently: Take 80 mg by mouth every evening. )  . traMADol (ULTRAM) 50 MG tablet Take 1-2 tablets (50-100 mg total) by mouth every 6 (six) hours as needed (mild pain).  . [DISCONTINUED] HYDROmorphone (DILAUDID) 2 MG tablet Take 1-2 tablets (2-4 mg total) by mouth every 4 (four) hours as needed for moderate pain or severe pain.  . [DISCONTINUED] LORazepam (ATIVAN) 1 MG tablet TAKE ONE-HALF TO 1 TABLET BY MOUTH THREE TIMES DAILY AS NEEDED FOR ANXIETY  . [DISCONTINUED] rivaroxaban (XARELTO) 10 MG TABS tablet Take 1 tablet (10 mg total) by mouth daily with breakfast. Take Xarelto for two and a half more weeks following discharge from the hospital, then discontinue Xarelto. Once the patient has completed the Xarelto, they may resume the 81 mg Aspirin.   No facility-administered encounter medications on file as of 03/21/2018.     Allergies  Allergen Reactions  . Simvastatin Other (See Comments)     pt states INTOL to ZOCOR w/ leg pains    Immunization History  Administered Date(s) Administered  . Influenza Split 05/08/2012, 06/03/2013  . Influenza Whole 08/19/2009  . Influenza, High Dose Seasonal PF 06/29/2016, 06/29/2017  . Influenza,inj,Quad PF,6+ Mos 04/18/2014,  06/01/2015  . Pneumococcal Conjugate-13 08/13/2013  . Tdap 02/10/2014    Current Medications, Allergies, Past Medical History, Past Surgical History, Family History, and Social History were reviewed in [employer] record.   Review of Systems         See HPI - The patient denies anorexia, fever, weight loss, weight gain, vision loss, decreased hearing, hoarseness, chest pain, syncope, dyspnea on exertion, peripheral edema, prolonged cough, headaches, hemoptysis, abdominal pain, melena, hematochezia, severe indigestion/heartburn, hematuria, incontinence, muscle weakness, suspicious skin lesions, transient  blindness, difficulty walking, depression, unusual weight change, abnormal bleeding, enlarged lymph nodes, and angioedema.     Objective:   Physical Exam    WD, WN, 77 y/o WM in NAD... GENERAL:  Alert & oriented; pleasant & cooperative... HEENT:  [place]/AT, EOM-wnl, PERRLA, EACs-clear, TMs-wnl, NOSE-clear, THROAT-clear & wnl. NECK:  Supple w/ fairROM; no JVD; normal carotid impulses w/o bruits; no thyromegaly or nodules palpated; no lymphadenopathy. CHEST:  Clear to P & A; without wheezes/ rales/ or rhonchi heard... HEART:  Regular Rhythm; without murmurs/ rubs/ or gallops detected... ABDOMEN:  Soft & nontender; normal bowel sounds; no organomegaly or masses palpated... EXT: without deformities, mild arthritic changes; no varicose veins/ venous insuffic/ or edema.     c/o pain in low back, some SLR difficulty, reflexes symmetric, no rash apparent... NEURO:  CN's intact; motor testing normal; sensory testing normal; gait normal & balance OK. DERM:  No lesions noted; no rash etc...  RADIOLOGY DATA:  Reviewed in the EPIC EMR & discussed w/ the patient...  LABORATORY DATA:  Reviewed in the EPIC EMR & discussed w/ the patient...   Assessment:      09/18/17>   Pre-op eval for planned L-THR by DrAlusio; Saim is OK for surg, Labs are satis, CXR 12/2018was satis- no acute cardiopulm dis, neg for nodules, & EKG 12/18 showed SBrady at 55/min 1st degree AVB otherw wnl; prostate ca under control now w/ Lupron; continue same meds...   Hx Bronchitis, and Mult Pulm Nodules>  His breathing has been fine, at baseline w/o new complaints etc;  Previous mult pulm nodules from 2007 were likely prostate ca mets that resolved w/ his treatment IMRT 2008 and hormone therapy x12yr;  CXRs in 2008, 2009, & 2011 were clear;  CXR 7/15 showed return of bilat pulm nodules (assoc w/ rising PSA=6.15) & subseq CT Chest, PET scan, Bone scan, Needle bx lung lesion confirmed Dx metastatic prostate cancer in lung. 9/15> [name]  started treatment w/ Lupron Q455moCasodex x30d at outset... 12/15> CXR already shows signs of improvement w/ decr size & number of lesions... 5/16> CXR nodules have resolved w/ ongoing treatment for his prostate cancer... 6/17>  He remains on Lupron from [name] w/ PSA reported at zero & CXR remains clear  HBP>  Stable on low dose Aten & Lisin, continue same...  CAD>  Followed by [name]; seen last 10/14 & he remains stable, no changes made; needs to incr his exercise program...  Mixed Dyslipidemia>  On Prav20 but TG & HDL deranged; needs better diet, exercise, & wt reduction...  DM2>  Controlled on diet + MetforminER500...  GI- GERD, Diverics, needs f/u colon>  F/u colon 8/14 was neg x divertics, no polyps, f/u rec for 1019yr.  Prostate Ca>  Managed by [name] as noted; rising PSA indicates recurrent dis and mult lung nodules represent metastatic dis... ~  Now improved on Lupron shots w/ PSA <0.04 & Testos ~32...  DJD>  Hx left knee problems- stable  now w/ OTC analgesics... Right leg pain ?etiology> we treated empirically w/ Neurontin & prn oxycod; he went to see Ortho in [place], MRI done, referred to NS- DrJJenkins & we will call for his notes...  Anxiety>  He is requesting Rx- try ALPRAZOLAM 0.'5mg'$  tid as needed...      Plan:     Patient's Medications  New Prescriptions   No medications on file  Previous Medications   ACETAMINOPHEN (TYLENOL) 500 MG TABLET    Take 500 mg by mouth 3 (three) times daily as needed (for pain.).   ALPRAZOLAM (XANAX) 0.5 MG TABLET    Take 0.25-0.5 mg by mouth 3 (three) times daily as needed for anxiety.   AMLODIPINE (NORVASC) 5 MG TABLET    Take 1 tablet (5 mg total) by mouth daily.   GABAPENTIN (NEURONTIN) 100 MG CAPSULE    TAKE 2 CAPSULES BY MOUTH EVERY NIGHT AT BEDTIME   LISINOPRIL (PRINIVIL,ZESTRIL) 10 MG TABLET    TAKE 1 TABLET BY MOUTH EVERY DAY   MAGNESIUM 400 MG CAPS    Take 400 mg by mouth daily.    METFORMIN (GLUCOPHAGE XR) 500 MG 24  HR TABLET    Take 1 tablet (500 mg total) by mouth daily with breakfast.   METHOCARBAMOL (ROBAXIN) 500 MG TABLET    Take 1 tablet (500 mg total) by mouth every 6 (six) hours as needed for muscle spasms.   OMEPRAZOLE (PRILOSEC) 40 MG CAPSULE    TAKE ONE CAPSULE BY MOUTH EVERY DAY   POTASSIUM 99 MG TABS    Take 99 mg by mouth daily.   PRAVASTATIN (PRAVACHOL) 80 MG TABLET    Take 1 tablet (80 mg total) by mouth daily.   TRAMADOL (ULTRAM) 50 MG TABLET    Take 1-2 tablets (50-100 mg total) by mouth every 6 (six) hours as needed (mild pain).  Modified Medications   No medications on file  Discontinued Medications   HYDROMORPHONE (DILAUDID) 2 MG TABLET    Take 1-2 tablets (2-4 mg total) by mouth every 4 (four) hours as needed for moderate pain or severe pain.   LORAZEPAM (ATIVAN) 1 MG TABLET    TAKE ONE-HALF TO 1 TABLET BY MOUTH THREE TIMES DAILY AS NEEDED FOR ANXIETY   RIVAROXABAN (XARELTO) 10 MG TABS TABLET    Take 1 tablet (10 mg total) by mouth daily with breakfast. Take Xarelto for two and a half more weeks following discharge from the hospital, then discontinue Xarelto. Once the patient has completed the Xarelto, they may resume the 81 mg Aspirin.

## 2018-03-27 ENCOUNTER — Other Ambulatory Visit: Payer: Self-pay | Admitting: Pulmonary Disease

## 2018-04-18 DIAGNOSIS — C61 Malignant neoplasm of prostate: Secondary | ICD-10-CM | POA: Diagnosis not present

## 2018-04-21 ENCOUNTER — Other Ambulatory Visit: Payer: Self-pay | Admitting: Pulmonary Disease

## 2018-04-26 DIAGNOSIS — C61 Malignant neoplasm of prostate: Secondary | ICD-10-CM | POA: Diagnosis not present

## 2018-04-30 ENCOUNTER — Other Ambulatory Visit: Payer: Self-pay | Admitting: Urology

## 2018-04-30 DIAGNOSIS — C61 Malignant neoplasm of prostate: Secondary | ICD-10-CM

## 2018-05-08 ENCOUNTER — Other Ambulatory Visit: Payer: Self-pay | Admitting: Pulmonary Disease

## 2018-05-08 DIAGNOSIS — C61 Malignant neoplasm of prostate: Secondary | ICD-10-CM | POA: Diagnosis not present

## 2018-05-14 DIAGNOSIS — Z5111 Encounter for antineoplastic chemotherapy: Secondary | ICD-10-CM | POA: Diagnosis not present

## 2018-05-14 DIAGNOSIS — C61 Malignant neoplasm of prostate: Secondary | ICD-10-CM | POA: Diagnosis not present

## 2018-05-15 ENCOUNTER — Encounter (HOSPITAL_COMMUNITY)
Admission: RE | Admit: 2018-05-15 | Discharge: 2018-05-15 | Disposition: A | Discharge status: Home / Self Care | Payer: Medicare Other | Source: Ambulatory Visit | Attending: Urology | Admitting: Urology

## 2018-05-15 DIAGNOSIS — C61 Malignant neoplasm of prostate: Secondary | ICD-10-CM | POA: Diagnosis not present

## 2018-05-15 MED ORDER — TECHNETIUM TC 99M MEDRONATE IV KIT
20.0000 | PACK | Freq: Once | INTRAVENOUS | Status: AC | PRN
  Administered 2018-05-15: 20.7 via INTRAVENOUS

## 2018-07-05 ENCOUNTER — Other Ambulatory Visit: Payer: Self-pay | Admitting: Pulmonary Disease

## 2018-07-14 ENCOUNTER — Other Ambulatory Visit: Payer: Self-pay | Admitting: Pulmonary Disease

## 2018-07-29 ENCOUNTER — Other Ambulatory Visit: Payer: Self-pay | Admitting: Pulmonary Disease

## 2018-07-30 DIAGNOSIS — H524 Presbyopia: Secondary | ICD-10-CM | POA: Diagnosis not present

## 2018-08-06 ENCOUNTER — Other Ambulatory Visit: Payer: Self-pay | Admitting: Pulmonary Disease

## 2018-08-27 ENCOUNTER — Encounter: Payer: Self-pay | Admitting: Family Medicine

## 2018-08-27 ENCOUNTER — Ambulatory Visit (INDEPENDENT_AMBULATORY_CARE_PROVIDER_SITE_OTHER): Payer: Medicare Other | Admitting: Family Medicine

## 2018-08-27 VITALS — BP 126/80 | HR 67 | Ht 70.0 in | Wt 188.0 lb

## 2018-08-27 DIAGNOSIS — E119 Type 2 diabetes mellitus without complications: Secondary | ICD-10-CM

## 2018-08-27 DIAGNOSIS — I10 Essential (primary) hypertension: Secondary | ICD-10-CM | POA: Diagnosis not present

## 2018-08-27 DIAGNOSIS — F411 Generalized anxiety disorder: Secondary | ICD-10-CM

## 2018-08-27 DIAGNOSIS — I2510 Atherosclerotic heart disease of native coronary artery without angina pectoris: Secondary | ICD-10-CM | POA: Diagnosis not present

## 2018-08-27 NOTE — Progress Notes (Signed)
Established Patient Office Visit  Subjective:  Patient ID: [name], male    DOB: 06-May-1941  Age: 78 y.o. MRN: [id number]  CC:  Chief Complaint  Patient presents with  . Establish Care    HPI [name] presents for establishment of care by way of transfer for follow-up of his hypertension, coronary artery disease, type 2 diabetes and anxiety.  Hypertension and coronary artery disease are well controlled with amlodipine, lisinopril pravastatin.  He sometimes has excessive bleeding with aspirin.  Patient's diabetes is controlled with Glucophage lisinopril only.  He takes Xanax at night only for sleep.  Patient lives with his wife.  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  Drinks 1-2 beers nightly.  He does not smoke.  He sees the eye doctor yearly.  Past Medical History:  Diagnosis Date  . 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 Aortic atherosclerosis ([place])   . Biceps muscle tear    right  . CAD (coronary artery disease)    anterior ischemia on a stress perfusion study.  (Catheterization in 2007, demonstrating 95% mid-LAD stenosis.  The circumflex has 20% proximal stenosis, the right coronary artery had 20% mid stenosis.  The EF was 60%.  He had a non drug eluting stent placed.    . Diverticulosis of colon (without mention of hemorrhage)   . Dyspnea    mild with extreme exertion  . Esophageal reflux   . Esophageal stricture   . History of bronchitis   . HLD (hyperlipidemia)   . Hot flashes   . Malignant neoplasm of prostate ([place])   . Osteoarthrosis, unspecified whether generalized or localized, unspecified site   . Osteoarthrosis, unspecified whether generalized or localized, unspecified site   . Other and unspecified hyperlipidemia   . Pulmonary nodule    has resolved  . Recurrent aspiration bronchitis/pneumonia ([place])   . Skin cancer   . Spinal stenosis   . Type 2 diabetes mellitus ([place])   . Unspecified essential hypertension     Past Surgical  History:  Procedure Laterality Date  . CARDIAC CATHETERIZATION  2007  . COLONOSCOPY  03/13/2013  . DISTAL BICEPS TENDON REPAIR Right 03/12/2015   Procedure: REPAIR/RECONSTRUCTION RIGHT BICEPS TENDON;  Surgeon: Daryll Brod, MD;  Location: [facility];  Service: Orthopedics;  Laterality: Right;  . ESOPHAGOGASTRODUODENOSCOPY  2011  . eyelid surgery for Lid Lag    . FOOT SURGERY    . HYDROCELE EXCISION / REPAIR    . INGUINAL HERNIA REPAIR     78 yrs old  . MASTECTOMY Left    benign tumor  . Prostate treatment     cancer  . SKIN CANCER EXCISION  2012   skin cancer on right ear  . TOTAL HIP ARTHROPLASTY Left 10/23/2017   Procedure: LEFT TOTAL HIP ARTHROPLASTY ANTERIOR APPROACH;  Surgeon: Gaynelle Arabian, MD;  Location: [facility];  Service: Orthopedics;  Laterality: Left;    Family History  Problem Relation Age of Onset  . Prostate cancer Father   . Prostate cancer Brother   . Colon cancer Neg Hx     Social History   Socioeconomic History  . Marital status: Married    Spouse name: Not on file  . Number of children: 1  . Years of education: Not on file  . Highest education level: Not on file  Occupational History  . Occupation: Psychologist, occupational  Social Needs  . Financial resource strain: Not on file  .  Food insecurity:    Worry: Not on file    Inability: Not on file  . Transportation needs:    Medical: Not on file    Non-medical: Not on file  Tobacco Use  . Smoking status: Former Smoker    Packs/day: 1.00    Years: 18.00    Pack years: 18.00    Types: Cigarettes    Last attempt to quit: 04/24/1977    Years since quitting: 41.3  . Smokeless tobacco: Former Systems developer    Types: Snuff, Chew  Substance and Sexual Activity  . Alcohol use: Yes    Alcohol/week: 12.0 standard drinks    Types: 12 Cans of beer per week    Comment: 1-2 beers a day  . Drug use: No  . Sexual activity: Not on file  Lifestyle  . Physical activity:    Days per week: Not on file    Minutes  per session: Not on file  . Stress: Not on file  Relationships  . Social connections:    Talks on phone: Not on file    Gets together: Not on file    Attends religious service: Not on file    Active member of club or organization: Not on file    Attends meetings of clubs or organizations: Not on file    Relationship status: Not on file  . Intimate partner violence:    Fear of current or ex partner: Not on file    Emotionally abused: Not on file    Physically abused: Not on file    Forced sexual activity: Not on file  Other Topics Concern  . Not on file  Social History Narrative  . Not on file    Outpatient Medications Prior to Visit  Medication Sig Dispense Refill  . acetaminophen (TYLENOL) 500 MG tablet Take 500 mg by mouth 3 (three) times daily as needed (for pain.).    Marland Kitchen ALPRAZolam (XANAX) 0.5 MG tablet Take 0.25-0.5 mg by mouth 3 (three) times daily as needed for anxiety.    Marland Kitchen amLODipine (NORVASC) 5 MG tablet TAKE 1 TABLET(5 MG) BY MOUTH DAILY 30 tablet 11  . gabapentin (NEURONTIN) 100 MG capsule TAKE 2 CAPSULES BY MOUTH EVERY NIGHT AT BEDTIME 60 capsule 2  . lisinopril (PRINIVIL,ZESTRIL) 10 MG tablet TAKE 1 TABLET BY MOUTH EVERY DAY 90 tablet 0  . Magnesium 400 MG CAPS Take 400 mg by mouth daily.     . metFORMIN (GLUCOPHAGE-XR) 500 MG 24 hr tablet TAKE 1 TABLET(500 MG) BY MOUTH DAILY WITH BREAKFAST 30 tablet 3  . methocarbamol (ROBAXIN) 500 MG tablet Take 1 tablet (500 mg total) by mouth every 6 (six) hours as needed for muscle spasms. 60 tablet 0  . omeprazole (PRILOSEC) 40 MG capsule TAKE ONE CAPSULE BY MOUTH EVERY DAY 90 capsule 0  . Potassium 99 MG TABS Take 99 mg by mouth daily.    . pravastatin (PRAVACHOL) 80 MG tablet Take 1 tablet (80 mg total) by mouth daily. (Patient taking differently: Take 80 mg by mouth every evening. ) 90 tablet 3  . traMADol (ULTRAM) 50 MG tablet Take 1-2 tablets (50-100 mg total) by mouth every 6 (six) hours as needed (mild pain). 56 tablet 0    No facility-administered medications prior to visit.     Allergies  Allergen Reactions  . Simvastatin Other (See Comments)     pt states INTOL to ZOCOR w/ leg pains    ROS Review of Systems  Constitutional: Negative.  HENT: Negative.   Eyes: Negative for photophobia and visual disturbance.  Respiratory: Negative.   Cardiovascular: Negative.  Negative for chest pain.  Gastrointestinal: Negative.   Endocrine: Negative for polyphagia and polyuria.  Genitourinary: Negative for decreased urine volume, difficulty urinating, frequency and hematuria.  Musculoskeletal: Negative for gait problem and joint swelling.  Skin: Negative for pallor.  Allergic/Immunologic: Negative for immunocompromised state.  Neurological: Negative for light-headedness and headaches.  Hematological: Does not bruise/bleed easily.  Psychiatric/Behavioral: Positive for sleep disturbance.      Objective:    Physical Exam  Constitutional: He is oriented to person, place, and time. He appears well-developed and well-nourished. No distress.  HENT:  Head: Normocephalic and atraumatic.  Right Ear: External ear normal.  Left Ear: External ear normal.  Mouth/Throat: Oropharynx is clear and moist. No oropharyngeal exudate.  Eyes: Pupils are equal, round, and reactive to light. Right eye exhibits no discharge. Left eye exhibits no discharge. No scleral icterus.  Neck: Neck supple. No JVD present. No tracheal deviation present. No thyromegaly present.  Cardiovascular: Normal rate, regular rhythm and normal heart sounds.  Pulmonary/Chest: Effort normal and breath sounds normal. No stridor.  Abdominal: Soft. Bowel sounds are normal. He exhibits no distension and no mass. There is no abdominal tenderness. There is no rebound and no guarding.  Musculoskeletal:        General: No edema.  Lymphadenopathy:    He has no cervical adenopathy.  Neurological: He is alert and oriented to person, place, and time.  Skin: Skin  is warm and dry. He is not diaphoretic.  Psychiatric: He has a normal mood and affect. His behavior is normal.    BP 126/80   Pulse 67   Ht 5\' 10"  (1.778 m)   Wt 188 lb (85.3 kg)   SpO2 98%   BMI 26.98 kg/m  Wt Readings from Last 3 Encounters:  08/27/18 188 lb (85.3 kg)  03/21/18 177 lb 6.4 oz (80.5 kg)  10/23/17 177 lb (80.3 kg)   BP Readings from Last 3 Encounters:  08/27/18 126/80  03/21/18 132/78  10/24/17 137/76   Guideline developer:  UpToDate (see UpToDate for funding source) Date Released: June 2014  Health Maintenance Due  Topic Date Due  . FOOT EXAM  08/31/1950  . OPHTHALMOLOGY EXAM  08/31/1950  . PNA vac Low Risk Adult (2 of 2 - PPSV23) 08/13/2014    There are no preventive care reminders to display for this patient.  Lab Results  Component Value Date   TSH 0.74 03/21/2018   Lab Results  Component Value Date   WBC 7.2 03/21/2018   HGB 13.4 03/21/2018   HCT 38.9 (L) 03/21/2018   MCV 91.2 03/21/2018   PLT 191.0 03/21/2018   Lab Results  Component Value Date   NA 141 03/21/2018   K 4.7 03/21/2018   CO2 28 03/21/2018   GLUCOSE 133 (H) 03/21/2018   BUN 23 03/21/2018   CREATININE 0.82 03/21/2018   BILITOT 0.8 03/21/2018   ALKPHOS 62 03/21/2018   AST 17 03/21/2018   ALT 19 03/21/2018   PROT 6.7 03/21/2018   ALBUMIN 4.4 03/21/2018   CALCIUM 9.5 03/21/2018   ANIONGAP 10 10/24/2017   GFR 96.94 03/21/2018   Lab Results  Component Value Date   CHOL 160 03/21/2018   Lab Results  Component Value Date   HDL 42.00 03/21/2018   Lab Results  Component Value Date   LDLCALC 80 03/21/2018   Lab Results  Component Value Date  TRIG 194.0 (H) 03/21/2018   Lab Results  Component Value Date   CHOLHDL 4 03/21/2018   Lab Results  Component Value Date   HGBA1C 6.9 (H) 03/21/2018      Assessment & Plan:   Problem List Items Addressed This Visit      Cardiovascular and Mediastinum   Essential hypertension - Primary   Relevant Orders   CBC    Comprehensive metabolic panel   Urinalysis, Routine w reflex microscopic   Microalbumin / creatinine urine ratio   Coronary atherosclerosis   Relevant Orders   Lipid panel     Endocrine   Diabetes mellitus type 2, controlled, without complications ([place])   Relevant Orders   CBC   Comprehensive metabolic panel   Microalbumin / creatinine urine ratio   Hemoglobin A1c     Other   Anxiety state      No orders of the defined types were placed in this encounter.   Follow-up: Return return in march for check up. labs ordered today. .   Patient will return fasting for his next visit for above ordered blood work.  He uses Xanax only at night to help him sleep.  Discussed taking aspirin every other day to every third day to control excessive bleeding.  Follow-up with urology in April.

## 2018-10-02 ENCOUNTER — Other Ambulatory Visit (INDEPENDENT_AMBULATORY_CARE_PROVIDER_SITE_OTHER): Payer: Medicare Other

## 2018-10-02 ENCOUNTER — Other Ambulatory Visit: Payer: Self-pay

## 2018-10-02 DIAGNOSIS — E119 Type 2 diabetes mellitus without complications: Secondary | ICD-10-CM

## 2018-10-02 DIAGNOSIS — I10 Essential (primary) hypertension: Secondary | ICD-10-CM

## 2018-10-02 DIAGNOSIS — I2510 Atherosclerotic heart disease of native coronary artery without angina pectoris: Secondary | ICD-10-CM

## 2018-10-02 LAB — URINALYSIS, ROUTINE W REFLEX MICROSCOPIC
BILIRUBIN URINE: NEGATIVE
HGB URINE DIPSTICK: NEGATIVE
Ketones, ur: NEGATIVE
LEUKOCYTE UA: NEGATIVE
Nitrite: NEGATIVE
RBC / HPF: NONE SEEN (ref 0–?)
Specific Gravity, Urine: 1.02 (ref 1.000–1.030)
Total Protein, Urine: NEGATIVE
Urine Glucose: NEGATIVE
Urobilinogen, UA: 0.2 (ref 0.0–1.0)
pH: 6 (ref 5.0–8.0)

## 2018-10-02 LAB — CBC
HCT: 39.1 % (ref 39.0–52.0)
Hemoglobin: 13.3 g/dL (ref 13.0–17.0)
MCHC: 34 g/dL (ref 30.0–36.0)
MCV: 93.8 fl (ref 78.0–100.0)
Platelets: 164 10*3/uL (ref 150.0–400.0)
RBC: 4.17 Mil/uL — ABNORMAL LOW (ref 4.22–5.81)
RDW: 13.4 % (ref 11.5–15.5)
WBC: 7.1 10*3/uL (ref 4.0–10.5)

## 2018-10-02 LAB — COMPREHENSIVE METABOLIC PANEL
ALK PHOS: 63 U/L (ref 39–117)
ALT: 19 U/L (ref 0–53)
AST: 17 U/L (ref 0–37)
Albumin: 4.1 g/dL (ref 3.5–5.2)
BUN: 22 mg/dL (ref 6–23)
CO2: 30 mEq/L (ref 19–32)
Calcium: 9.2 mg/dL (ref 8.4–10.5)
Chloride: 104 mEq/L (ref 96–112)
Creatinine, Ser: 0.84 mg/dL (ref 0.40–1.50)
GFR: 88.58 mL/min (ref >60.00)
Glucose, Bld: 191 mg/dL — ABNORMAL HIGH (ref 70–99)
Potassium: 4.5 mEq/L (ref 3.5–5.1)
Sodium: 140 mEq/L (ref 135–145)
Total Bilirubin: 0.5 mg/dL (ref 0.2–1.2)
Total Protein: 6.6 g/dL (ref 6.0–8.3)

## 2018-10-02 LAB — LIPID PANEL
CHOLESTEROL: 144 mg/dL (ref 0–200)
HDL: 41.5 mg/dL (ref >39.00)
LDL Cholesterol: 84 mg/dL (ref 0–99)
NonHDL: 102.83
Total CHOL/HDL Ratio: 3
Triglycerides: 92 mg/dL (ref 0.0–149.0)
VLDL: 18.4 mg/dL (ref 0.0–40.0)

## 2018-10-02 LAB — MICROALBUMIN / CREATININE URINE RATIO
Creatinine,U: 73.7 mg/dL
Microalb Creat Ratio: 1 mg/g (ref 0.0–30.0)
Microalb, Ur: 0.7 mg/dL (ref 0.0–1.9)

## 2018-10-02 LAB — HEMOGLOBIN A1C: Hgb A1c MFr Bld: 7.2 % — ABNORMAL HIGH (ref 4.6–6.5)

## 2018-10-03 ENCOUNTER — Other Ambulatory Visit: Payer: Self-pay

## 2018-10-04 ENCOUNTER — Encounter: Payer: Self-pay | Admitting: Family Medicine

## 2018-10-04 ENCOUNTER — Ambulatory Visit (INDEPENDENT_AMBULATORY_CARE_PROVIDER_SITE_OTHER): Payer: Medicare Other

## 2018-10-04 ENCOUNTER — Ambulatory Visit (INDEPENDENT_AMBULATORY_CARE_PROVIDER_SITE_OTHER): Payer: Medicare Other | Admitting: Family Medicine

## 2018-10-04 VITALS — BP 136/80 | HR 64 | Temp 98.4°F | Ht 70.0 in | Wt 185.5 lb

## 2018-10-04 DIAGNOSIS — R059 Cough, unspecified: Secondary | ICD-10-CM | POA: Insufficient documentation

## 2018-10-04 DIAGNOSIS — R05 Cough: Secondary | ICD-10-CM

## 2018-10-04 DIAGNOSIS — J301 Allergic rhinitis due to pollen: Secondary | ICD-10-CM | POA: Diagnosis not present

## 2018-10-04 DIAGNOSIS — E119 Type 2 diabetes mellitus without complications: Secondary | ICD-10-CM | POA: Diagnosis not present

## 2018-10-04 DIAGNOSIS — C7800 Secondary malignant neoplasm of unspecified lung: Secondary | ICD-10-CM

## 2018-10-04 MED ORDER — PREDNISONE 20 MG PO TABS: 20.0000 mg | ORAL_TABLET | Freq: Two times a day (BID) | ORAL | 0 refills | Status: AC

## 2018-10-04 MED ORDER — FLUTICASONE PROPIONATE 50 MCG/ACT NA SUSP: 2.0000 | Freq: Every day | NASAL | 6 refills | Status: DC

## 2018-10-04 MED ORDER — METFORMIN HCL ER 500 MG PO TB24: 500.0000 mg | ORAL_TABLET | Freq: Two times a day (BID) | ORAL | 3 refills | Status: DC

## 2018-10-04 NOTE — Progress Notes (Signed)
Established Patient Office Visit  Subjective:  Patient ID: [name], male    DOB: [birth year]  Age: 78 y.o. MRN: [id number]  CC:  Chief Complaint  Patient presents with   Annual Exam    HPI SWAIN ACREE presents for follow-up of his blood work and evaluation of a cough.  Bump upward in his hemoglobin A1c with increased appetite and some weight gain.  He is tolerating the Glucophage well.  He continues to work full-time and is quite active on his job.  History of prostate cancer treated with ionizing radiation and hormone therapy.  History of metastatic disease to his lung with a question of a new lesion in the right hip area.  Seeing urology next month.  He has had a cough for the last few weeks that is nonproductive and associated with some wheezing there is postnasal drip and a runny nose.  This is usual for him in the early spring.  He is treating this with some non-NyQuil and Afrin nose spray.  There is been no fever chills nausea or vomiting.  Cough is been mostly dry and nonproductive.  No history of asthma and quit smoking tobacco many years ago.  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  (Catheterization in 2007, demonstrating 95% mid-LAD stenosis.  The circumflex has 20% proximal stenosis, the right coronary artery had 20% mid stenosis.  The EF was 60%.  He had a non drug eluting stent placed.     Diverticulosis of colon (without mention of hemorrhage)    Dyspnea    mild with extreme exertion   Esophageal reflux    Esophageal stricture    History of bronchitis    HLD (hyperlipidemia)    Hot flashes    Malignant neoplasm of prostate ([facility])    Osteoarthrosis, unspecified whether generalized or localized, unspecified site    Osteoarthrosis, unspecified whether generalized or localized,  unspecified site    Other and unspecified hyperlipidemia    Pulmonary nodule    has resolved   Recurrent aspiration bronchitis/pneumonia ([place])    Skin cancer    Spinal stenosis    Type 2 diabetes mellitus ([place])    Unspecified essential hypertension     Past Surgical History:  Procedure Laterality Date   CARDIAC CATHETERIZATION  2007   COLONOSCOPY  03/13/2013   DISTAL BICEPS TENDON REPAIR Right 03/12/2015   Procedure: REPAIR/RECONSTRUCTION RIGHT BICEPS TENDON;  Surgeon: Daryll Brod, MD;  Location: [facility];  Service: Orthopedics;  Laterality: Right;   ESOPHAGOGASTRODUODENOSCOPY  2011   eyelid surgery for Lid Lag     FOOT SURGERY     HYDROCELE EXCISION / REPAIR     INGUINAL HERNIA REPAIR     78 yrs old   MASTECTOMY Left    benign tumor   Prostate treatment     cancer   SKIN CANCER EXCISION  2012   skin cancer on right ear   TOTAL HIP ARTHROPLASTY Left 10/23/2017   Procedure: LEFT TOTAL HIP ARTHROPLASTY ANTERIOR APPROACH;  Surgeon: Gaynelle Arabian, MD;  Location: [facility];  Service: Orthopedics;  Laterality: Left;    Family History  Problem Relation Age of Onset   Prostate cancer Father    Prostate cancer Brother    Colon  cancer Neg Hx     Social History   Socioeconomic History   Marital status: Married    Spouse name: Not on file   Number of children: 1   Years of education: Not on file   Highest education level: Not on file  Occupational History   Occupation: Psychologist, occupational  Social Needs   Financial resource strain: Not on file   Food insecurity:    Worry: Not on file    Inability: Not on file   Transportation needs:    Medical: Not on file    Non-medical: Not on file  Tobacco Use   Smoking status: Former Smoker    Packs/day: 1.00    Years: 18.00    Pack years: 18.00    Types: Cigarettes    Last attempt to quit: 04/24/1977    Years since quitting: 41.4   Smokeless tobacco: Former Systems developer    Types: Snuff, Chew    Substance and Sexual Activity   Alcohol use: Yes    Alcohol/week: 12.0 standard drinks    Types: 12 Cans of beer per week    Comment: 1-2 beers a day   Drug use: No   Sexual activity: Not on file  Lifestyle   Physical activity:    Days per week: Not on file    Minutes per session: Not on file   Stress: Not on file  Relationships   Social connections:    Talks on phone: Not on file    Gets together: Not on file    Attends religious service: Not on file    Active member of club or organization: Not on file    Attends meetings of clubs or organizations: Not on file    Relationship status: Not on file   Intimate partner violence:    Fear of current or ex partner: Not on file    Emotionally abused: Not on file    Physically abused: Not on file    Forced sexual activity: Not on file  Other Topics Concern   Not on file  Social History Narrative   Not on file    Outpatient Medications Prior to Visit  Medication Sig Dispense Refill   acetaminophen (TYLENOL) 500 MG tablet Take 500 mg by mouth 3 (three) times daily as needed (for pain.).     ALPRAZolam (XANAX) 0.5 MG tablet Take 0.25-0.5 mg by mouth 3 (three) times daily as needed for anxiety.     amLODipine (NORVASC) 5 MG tablet TAKE 1 TABLET(5 MG) BY MOUTH DAILY 30 tablet 11   gabapentin (NEURONTIN) 100 MG capsule TAKE 2 CAPSULES BY MOUTH EVERY NIGHT AT BEDTIME 60 capsule 2   lisinopril (PRINIVIL,ZESTRIL) 10 MG tablet TAKE 1 TABLET BY MOUTH EVERY DAY 90 tablet 0   Magnesium 400 MG CAPS Take 400 mg by mouth daily.      methocarbamol (ROBAXIN) 500 MG tablet Take 1 tablet (500 mg total) by mouth every 6 (six) hours as needed for muscle spasms. 60 tablet 0   omeprazole (PRILOSEC) 40 MG capsule TAKE ONE CAPSULE BY MOUTH EVERY DAY 90 capsule 0   pravastatin (PRAVACHOL) 80 MG tablet Take 1 tablet (80 mg total) by mouth daily. (Patient taking differently: Take 80 mg by mouth every evening. ) 90 tablet 3   traMADol  (ULTRAM) 50 MG tablet Take 1-2 tablets (50-100 mg total) by mouth every 6 (six) hours as needed (mild pain). 56 tablet 0   metFORMIN (GLUCOPHAGE-XR) 500 MG 24 hr tablet TAKE 1  TABLET(500 MG) BY MOUTH DAILY WITH BREAKFAST 30 tablet 3   Potassium 99 MG TABS Take 99 mg by mouth daily.     No facility-administered medications prior to visit.     Allergies  Allergen Reactions   Simvastatin Other (See Comments)     pt states INTOL to ZOCOR w/ leg pains    ROS Review of Systems  Constitutional: Negative for chills, diaphoresis, fatigue, fever and unexpected weight change.  HENT: Positive for congestion, postnasal drip and rhinorrhea. Negative for sinus pressure, sinus pain, sneezing and sore throat.   Eyes: Negative for photophobia and visual disturbance.  Respiratory: Positive for cough and wheezing. Negative for shortness of breath.   Cardiovascular: Negative for chest pain.  Gastrointestinal: Negative.   Endocrine: Negative for polyphagia and polyuria.  Genitourinary: Negative.  Negative for hematuria.  Musculoskeletal: Negative for joint swelling and myalgias.  Allergic/Immunologic: Negative for immunocompromised state.  Neurological: Negative for light-headedness and headaches.  Hematological: Does not bruise/bleed easily.  Psychiatric/Behavioral: Negative.       Objective:    Physical Exam  Constitutional: He is oriented to person, place, and time. He appears well-developed and well-nourished. No distress.  HENT:  Head: Normocephalic and atraumatic.  Right Ear: External ear normal.  Left Ear: External ear normal.  Mouth/Throat: Oropharynx is clear and moist. No oropharyngeal exudate.  Eyes: Pupils are equal, round, and reactive to light. Conjunctivae are normal. Right eye exhibits no discharge. Left eye exhibits no discharge. No scleral icterus.  Neck: Neck supple. No JVD present. No tracheal deviation present. No thyromegaly present.  Cardiovascular: Normal rate, regular  rhythm and normal heart sounds.  Pulmonary/Chest: Effort normal. No stridor. No respiratory distress. He has decreased breath sounds in the right middle field. He has no wheezes. He has no rhonchi. He has no rales.  Abdominal: Bowel sounds are normal.  Musculoskeletal:        General: No edema.  Lymphadenopathy:    He has no cervical adenopathy.  Neurological: He is alert and oriented to person, place, and time.  Skin: Skin is warm and dry. He is not diaphoretic.    BP 136/80    Pulse 64    Temp 98.4 F (36.9 C) (Oral)    Ht 5\' 10"  (1.778 m)    Wt 185 lb 8 oz (84.1 kg)    SpO2 99%    BMI 26.62 kg/m  Wt Readings from Last 3 Encounters:  10/04/18 185 lb 8 oz (84.1 kg)  08/27/18 188 lb (85.3 kg)  03/21/18 177 lb 6.4 oz (80.5 kg)   BP Readings from Last 3 Encounters:  10/04/18 136/80  08/27/18 126/80  03/21/18 132/78   Guideline developer:  UpToDate (see UpToDate for funding source) Date Released: June 2014  Health Maintenance Due  Topic Date Due   FOOT EXAM  08/31/1950   OPHTHALMOLOGY EXAM  08/31/1950   PNA vac Low Risk Adult (2 of 2 - PPSV23) 08/13/2014    There are no preventive care reminders to display for this patient.  Lab Results  Component Value Date   TSH 0.74 03/21/2018   Lab Results  Component Value Date   WBC 7.1 10/02/2018   HGB 13.3 10/02/2018   HCT 39.1 10/02/2018   MCV 93.8 10/02/2018   PLT 164.0 10/02/2018   Lab Results  Component Value Date   NA 140 10/02/2018   K 4.5 10/02/2018   CO2 30 10/02/2018   GLUCOSE 191 (H) 10/02/2018   BUN 22 10/02/2018  CREATININE 0.84 10/02/2018   BILITOT 0.5 10/02/2018   ALKPHOS 63 10/02/2018   AST 17 10/02/2018   ALT 19 10/02/2018   PROT 6.6 10/02/2018   ALBUMIN 4.1 10/02/2018   CALCIUM 9.2 10/02/2018   ANIONGAP 10 10/24/2017   GFR 88.58 10/02/2018   Lab Results  Component Value Date   CHOL 144 10/02/2018   Lab Results  Component Value Date   HDL 41.50 10/02/2018   Lab Results  Component Value  Date   LDLCALC 84 10/02/2018   Lab Results  Component Value Date   TRIG 92.0 10/02/2018   Lab Results  Component Value Date   CHOLHDL 3 10/02/2018   Lab Results  Component Value Date   HGBA1C 7.2 (H) 10/02/2018      Assessment & Plan:   Problem List Items Addressed This Visit      Respiratory   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 ([place]) - Primary   Relevant Medications   predniSONE (DELTASONE) 20 MG tablet   Other Relevant Orders   DG Chest 2 View   Seasonal allergic rhinitis due to pollen   Relevant Medications   predniSONE (DELTASONE) 20 MG tablet   fluticasone (FLONASE) 50 MCG/ACT nasal spray   Other Relevant Orders   DG Chest 2 View     Endocrine   Diabetes mellitus type 2, controlled, without complications ([facility])   Relevant Medications   metFORMIN (GLUCOPHAGE-XR) 500 MG 24 hr tablet     Other   Cough   Relevant Medications   predniSONE (DELTASONE) 20 MG tablet   fluticasone (FLONASE) 50 MCG/ACT nasal spray   Other Relevant Orders   DG Chest 2 View      Meds ordered this encounter  Medications   predniSONE (DELTASONE) 20 MG tablet    Sig: Take 1 tablet (20 mg total) by mouth 2 (two) times daily with a meal for 7 days.    Dispense:  14 tablet    Refill:  0   fluticasone (FLONASE) 50 MCG/ACT nasal spray    Sig: Place 2 sprays into both nostrils daily.    Dispense:  16 g    Refill:  6   metFORMIN (GLUCOPHAGE-XR) 500 MG 24 hr tablet    Sig: Take 1 tablet (500 mg total) by mouth 2 (two) times daily at 8 am and 10 pm.    Dispense:  180 tablet    Refill:  3    Follow-up: Return in about 3 months (around 01/04/2019).   Decreased air exchange noted throughout the right lung worrisome with his past medical history of metastatic prostate cancer.

## 2018-10-17 DIAGNOSIS — C61 Malignant neoplasm of prostate: Secondary | ICD-10-CM | POA: Diagnosis not present

## 2018-10-22 ENCOUNTER — Other Ambulatory Visit: Payer: Self-pay

## 2018-10-22 ENCOUNTER — Ambulatory Visit: Payer: Self-pay | Admitting: *Deleted

## 2018-10-22 DIAGNOSIS — C7801 Secondary malignant neoplasm of right lung: Secondary | ICD-10-CM | POA: Diagnosis not present

## 2018-10-22 DIAGNOSIS — C61 Malignant neoplasm of prostate: Secondary | ICD-10-CM | POA: Diagnosis not present

## 2018-10-22 NOTE — Telephone Encounter (Signed)
I tried calling patient, unable to leave a voicemail. Need to schedule appointment for a webex.  fyi-Dr. Ethelene Hal.

## 2018-10-22 NOTE — Telephone Encounter (Signed)
Pt scheduled for 4/7 at 1pm.

## 2018-10-22 NOTE — Telephone Encounter (Signed)
Pts wife calling initially, on DPR. Pt not present; [facility] called and spoke with pt. Pt reports LGT past week, max of 99.8 yesterday, has been taking tylenol. This AM temp 101.0 without tylenol. Saw Dr. Ethelene Hal 10/04/2018, placed on steroids for "Sinus trouble" States resolved. Denies any other symptoms,no cough, congestion, no SOB, dysuria. Does states has 1/2 long cut on his gum, lower right front from dentures; noted one month ago, Area is red and tender. [place] called and spoke with [facility]. Will route triage note. Pt has smart phone , email verified. Please advise: [phone]  Reason for Disposition . Fever present > 3 days (72 hours)  Answer Assessment - Initial Assessment Questions 1. TEMPERATURE: "What is the most recent temperature?"  "How was it measured?"      101.0 orally 2. ONSET: "When did the fever start?"      LGT all wek, max yesterday 99.8 until this am 3. SYMPTOMS: "Do you have any other symptoms besides the fever?"  (e.g., colds, headache, sore throat, earache, cough, rash, diarrhea, vomiting, abdominal pain)     no 4. CAUSE: If there are no symptoms, ask: "What do you think is causing the fever?"      Unsure 5. CONTACTS: "Does anyone else in the family have an infection?"     no 6. TREATMENT: "What have you done so far to treat this fever?" (e.g., medications)     tylenol 7. IMMUNOCOMPROMISE: "Do you have of the following: diabetes, HIV positive, splenectomy, cancer chemotherapy, chronic steroid treatment, transplant patient, etc."     H/O adenocarcinoma with mets  9. TRAVEL: "Have you traveled out of the country in the last month?" (e.g., travel history, exposures)     no  Protocols used: FEVER-A-AH

## 2018-10-23 ENCOUNTER — Encounter: Payer: Self-pay | Admitting: Family Medicine

## 2018-10-23 ENCOUNTER — Ambulatory Visit (INDEPENDENT_AMBULATORY_CARE_PROVIDER_SITE_OTHER): Payer: Medicare Other | Admitting: Family Medicine

## 2018-10-23 VITALS — Temp 97.4°F | Ht 70.0 in

## 2018-10-23 DIAGNOSIS — J22 Unspecified acute lower respiratory infection: Secondary | ICD-10-CM | POA: Insufficient documentation

## 2018-10-23 MED ORDER — DOXYCYCLINE HYCLATE 100 MG PO TABS: 100.0000 mg | ORAL_TABLET | Freq: Two times a day (BID) | ORAL | 0 refills | Status: DC

## 2018-10-23 NOTE — Progress Notes (Signed)
Established Patient Office Visit  Subjective:  Patient ID: [name], male    DOB: 06/06/1941  Age: 78 y.o. MRN: [id number]  CC:  Chief Complaint  Patient presents with  . Fever    no cough, congestion, sob, or dysuria. Temp yesterday AM was 101 without tylenol, temp day before was 99.8 with tylenol. Temp now 97.4, temp last night was 99.4 and pt did take tylenol.    HPI [name] presents for evaluation and treatment of evolving elevated temperature over the last week or so.  There is been a nighttime cough that is been nonproductive.  Patient denies wheezing or shortness of breath there is been no chest pain.  Chest x-ray obtained a few weeks ago did show some bronchitic changes.  Patient denies headache, nausea vomiting, diarrhea.  There has been some postnasal drip but there is been no rhinorrhea facial pressure or teeth pain.  Denies changes in his urine flow.  Saw urology yesterday and his PSA has stabilized.  Patient reports malaise and fatigue but denies myalgias or arthralgias.  He has had the recent development of a sore underneath his denture.  Denies that this sort is particularly tender.  His elevated temperatures have responded to Tylenol and Advil.  Past Medical History:  Diagnosis Date  . 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 Aortic atherosclerosis ([place])   . Biceps muscle tear    right  . CAD (coronary artery disease)    anterior ischemia on a stress perfusion study.  (Catheterization in 2007, demonstrating 95% mid-LAD stenosis.  The circumflex has 20% proximal stenosis, the right coronary artery had 20% mid stenosis.  The EF was 60%.  He had a non drug eluting stent placed.    . Diverticulosis of colon (without mention of hemorrhage)   . Dyspnea    mild with extreme exertion  . Esophageal reflux   . Esophageal stricture   . History of bronchitis   . HLD (hyperlipidemia)   . Hot flashes   . Malignant neoplasm of prostate ([place])   . Osteoarthrosis, unspecified  whether generalized or localized, unspecified site   . Osteoarthrosis, unspecified whether generalized or localized, unspecified site   . Other and unspecified hyperlipidemia   . Pulmonary nodule    has resolved  . Recurrent aspiration bronchitis/pneumonia ([place])   . Skin cancer   . Spinal stenosis   . Type 2 diabetes mellitus ([place])   . Unspecified essential hypertension     Past Surgical History:  Procedure Laterality Date  . CARDIAC CATHETERIZATION  2007  . COLONOSCOPY  03/13/2013  . DISTAL BICEPS TENDON REPAIR Right 03/12/2015   Procedure: REPAIR/RECONSTRUCTION RIGHT BICEPS TENDON;  Surgeon: Daryll Brod, MD;  Location: [facility];  Service: Orthopedics;  Laterality: Right;  . ESOPHAGOGASTRODUODENOSCOPY  2011  . eyelid surgery for Lid Lag    . FOOT SURGERY    . HYDROCELE EXCISION / REPAIR    . INGUINAL HERNIA REPAIR     77 yrs old  . MASTECTOMY Left    benign tumor  . Prostate treatment     cancer  . SKIN CANCER EXCISION  2012   skin cancer on right ear  . TOTAL HIP ARTHROPLASTY Left 10/23/2017   Procedure: LEFT TOTAL HIP ARTHROPLASTY ANTERIOR APPROACH;  Surgeon: Gaynelle Arabian, MD;  Location: [facility];  Service: Orthopedics;  Laterality: Left;    Family History  Problem Relation Age of Onset  . Prostate cancer Father   .  Prostate cancer Brother   . Colon cancer Neg Hx     Social History   Socioeconomic History  . Marital status: Married    Spouse name: Not on file  . Number of children: 1  . Years of education: Not on file  . Highest education level: Not on file  Occupational History  . Occupation: Psychologist, occupational  Social Needs  . Financial resource strain: Not on file  . Food insecurity:    Worry: Not on file    Inability: Not on file  . Transportation needs:    Medical: Not on file    Non-medical: Not on file  Tobacco Use  . Smoking status: Former Smoker    Packs/day: 1.00    Years: 18.00    Pack years: 18.00    Types: Cigarettes    Last  attempt to quit: 04/24/1977    Years since quitting: 41.5  . Smokeless tobacco: Former Systems developer    Types: Snuff, Chew  Substance and Sexual Activity  . Alcohol use: Yes    Alcohol/week: 12.0 standard drinks    Types: 12 Cans of beer per week    Comment: 1-2 beers a day  . Drug use: No  . Sexual activity: Not on file  Lifestyle  . Physical activity:    Days per week: Not on file    Minutes per session: Not on file  . Stress: Not on file  Relationships  . Social connections:    Talks on phone: Not on file    Gets together: Not on file    Attends religious service: Not on file    Active member of club or organization: Not on file    Attends meetings of clubs or organizations: Not on file    Relationship status: Not on file  . Intimate partner violence:    Fear of current or ex partner: Not on file    Emotionally abused: Not on file    Physically abused: Not on file    Forced sexual activity: Not on file  Other Topics Concern  . Not on file  Social History Narrative  . Not on file    Outpatient Medications Prior to Visit  Medication Sig Dispense Refill  . acetaminophen (TYLENOL) 500 MG tablet Take 500 mg by mouth 3 (three) times daily as needed (for pain.).    Marland Kitchen ALPRAZolam (XANAX) 0.5 MG tablet Take 0.25-0.5 mg by mouth 3 (three) times daily as needed for anxiety.    Marland Kitchen amLODipine (NORVASC) 5 MG tablet TAKE 1 TABLET(5 MG) BY MOUTH DAILY 30 tablet 11  . fluticasone (FLONASE) 50 MCG/ACT nasal spray Place 2 sprays into both nostrils daily. 16 g 6  . gabapentin (NEURONTIN) 100 MG capsule TAKE 2 CAPSULES BY MOUTH EVERY NIGHT AT BEDTIME 60 capsule 2  . lisinopril (PRINIVIL,ZESTRIL) 10 MG tablet TAKE 1 TABLET BY MOUTH EVERY DAY 90 tablet 0  . Magnesium 400 MG CAPS Take 400 mg by mouth daily.     . metFORMIN (GLUCOPHAGE-XR) 500 MG 24 hr tablet Take 1 tablet (500 mg total) by mouth 2 (two) times daily at 8 am and 10 pm. 180 tablet 3  . methocarbamol (ROBAXIN) 500 MG tablet Take 1 tablet  (500 mg total) by mouth every 6 (six) hours as needed for muscle spasms. 60 tablet 0  . omeprazole (PRILOSEC) 40 MG capsule TAKE ONE CAPSULE BY MOUTH EVERY DAY 90 capsule 0  . pravastatin (PRAVACHOL) 80 MG tablet Take 1 tablet (80 mg total)  by mouth daily. (Patient taking differently: Take 80 mg by mouth every evening. ) 90 tablet 3  . traMADol (ULTRAM) 50 MG tablet Take 1-2 tablets (50-100 mg total) by mouth every 6 (six) hours as needed (mild pain). 56 tablet 0   No facility-administered medications prior to visit.     Allergies  Allergen Reactions  . Simvastatin Other (See Comments)     pt states INTOL to [facility] w/ leg pains    ROS Review of Systems  Constitutional: Positive for chills, fatigue and fever. Negative for diaphoresis and unexpected weight change.  HENT: Positive for postnasal drip. Negative for rhinorrhea, sinus pressure and sinus pain.   Eyes: Negative for photophobia and visual disturbance.  Respiratory: Positive for cough. Negative for shortness of breath and wheezing.   Cardiovascular: Negative for chest pain and palpitations.  Gastrointestinal: Negative for abdominal pain, diarrhea, nausea and vomiting.  Endocrine: Negative for polyphagia and polyuria.  Genitourinary: Negative for difficulty urinating, frequency and hematuria.  Musculoskeletal: Negative for arthralgias and myalgias.  Skin: Negative for pallor and rash.  Neurological: Negative for numbness and headaches.  Hematological: Does not bruise/bleed easily.  Psychiatric/Behavioral: Negative.       Objective:    Physical Exam  Constitutional: He is oriented to person, place, and time. He appears well-developed and well-nourished. No distress.  HENT:  Head: Normocephalic and atraumatic.  Right Ear: External ear normal.  Left Ear: External ear normal.  Eyes: Right eye exhibits no discharge. Left eye exhibits no discharge. No scleral icterus.  Pulmonary/Chest: Effort normal.  Neurological: He is alert  and oriented to person, place, and time.  Skin: He is not diaphoretic.  Psychiatric: He has a normal mood and affect. His behavior is normal.    Temp (!) 97.4 F (36.3 C) (Oral)   Ht 5\' 10"  (1.778 m)   BMI 26.62 kg/m  Wt Readings from Last 3 Encounters:  10/04/18 185 lb 8 oz (84.1 kg)  08/27/18 188 lb (85.3 kg)  03/21/18 177 lb 6.4 oz (80.5 kg)     Health Maintenance Due  Topic Date Due  . FOOT EXAM  08/31/1950  . OPHTHALMOLOGY EXAM  08/31/1950  . PNA vac Low Risk Adult (2 of 2 - PPSV23) 08/13/2014    There are no preventive care reminders to display for this patient.  Lab Results  Component Value Date   TSH 0.74 03/21/2018   Lab Results  Component Value Date   WBC 7.1 10/02/2018   HGB 13.3 10/02/2018   HCT 39.1 10/02/2018   MCV 93.8 10/02/2018   PLT 164.0 10/02/2018   Lab Results  Component Value Date   NA 140 10/02/2018   K 4.5 10/02/2018   CO2 30 10/02/2018   GLUCOSE 191 (H) 10/02/2018   BUN 22 10/02/2018   CREATININE 0.84 10/02/2018   BILITOT 0.5 10/02/2018   ALKPHOS 63 10/02/2018   AST 17 10/02/2018   ALT 19 10/02/2018   PROT 6.6 10/02/2018   ALBUMIN 4.1 10/02/2018   CALCIUM 9.2 10/02/2018   ANIONGAP 10 10/24/2017   GFR 88.58 10/02/2018   Lab Results  Component Value Date   CHOL 144 10/02/2018   Lab Results  Component Value Date   HDL 41.50 10/02/2018   Lab Results  Component Value Date   LDLCALC 84 10/02/2018   Lab Results  Component Value Date   TRIG 92.0 10/02/2018   Lab Results  Component Value Date   CHOLHDL 3 10/02/2018   Lab Results  Component Value  Date   HGBA1C 7.2 (H) 10/02/2018      Assessment & Plan:   Problem List Items Addressed This Visit      Respiratory   Lower respiratory infection - Primary   Relevant Medications   doxycycline (VIBRA-TABS) 100 MG tablet      Meds ordered this encounter  Medications  . doxycycline (VIBRA-TABS) 100 MG tablet    Sig: Take 1 tablet (100 mg total) by mouth 2 (two) times  daily.    Dispense:  20 tablet    Refill:  0    Follow-up: Return in about 1 week (around 10/30/2018), or if symptoms worsen or fail to improve.    Libby Maw, MDVirtual Visit via Video Note  I connected with [name] on 10/23/18 at  1:00 PM EDT by a video enabled telemedicine application and verified that I am speaking with the correct person using two identifiers.   I discussed the limitations of evaluation and management by telemedicine and the availability of in person appointments. The patient expressed understanding and agreed to proceed.  History of Present Illness:    Observations/Objective:   Assessment and Plan:   Follow Up Instructions:    I discussed the assessment and treatment plan with the patient. The patient was provided an opportunity to ask questions and all were answered. The patient agreed with the plan and demonstrated an understanding of the instructions.   The patient was advised to call back or seek an in-person evaluation if the symptoms worsen or if the condition fails to improve as anticipated.  I provided 15 minutes of non-face-to-face time during this encounter.

## 2018-11-05 ENCOUNTER — Other Ambulatory Visit: Payer: Self-pay

## 2018-11-05 DIAGNOSIS — E782 Mixed hyperlipidemia: Secondary | ICD-10-CM

## 2018-11-05 MED ORDER — PRAVASTATIN SODIUM 80 MG PO TABS: 80.0000 mg | ORAL_TABLET | Freq: Every day | ORAL | 3 refills | Status: DC

## 2018-11-16 DIAGNOSIS — C61 Malignant neoplasm of prostate: Secondary | ICD-10-CM | POA: Diagnosis not present

## 2018-11-16 DIAGNOSIS — Z5111 Encounter for antineoplastic chemotherapy: Secondary | ICD-10-CM | POA: Diagnosis not present

## 2018-12-17 ENCOUNTER — Other Ambulatory Visit: Payer: Self-pay | Admitting: Family Medicine

## 2019-01-04 ENCOUNTER — Encounter: Payer: Self-pay | Admitting: Family Medicine

## 2019-01-04 ENCOUNTER — Ambulatory Visit (INDEPENDENT_AMBULATORY_CARE_PROVIDER_SITE_OTHER): Payer: Medicare Other | Admitting: Family Medicine

## 2019-01-04 DIAGNOSIS — E119 Type 2 diabetes mellitus without complications: Secondary | ICD-10-CM | POA: Diagnosis not present

## 2019-01-04 DIAGNOSIS — M545 Low back pain, unspecified: Secondary | ICD-10-CM

## 2019-01-04 DIAGNOSIS — F411 Generalized anxiety disorder: Secondary | ICD-10-CM | POA: Diagnosis not present

## 2019-01-04 DIAGNOSIS — G8929 Other chronic pain: Secondary | ICD-10-CM

## 2019-01-04 DIAGNOSIS — M25551 Pain in right hip: Secondary | ICD-10-CM

## 2019-01-04 MED ORDER — ALPRAZOLAM 0.5 MG PO TABS: 0.2500 mg | ORAL_TABLET | Freq: Two times a day (BID) | ORAL | 0 refills | Status: DC | PRN

## 2019-01-04 MED ORDER — GABAPENTIN 100 MG PO CAPS: 200.0000 mg | ORAL_CAPSULE | Freq: Every day | ORAL | 2 refills | Status: DC

## 2019-01-04 NOTE — Progress Notes (Signed)
Established Patient Office Visit  Subjective:  Patient ID: [name], male    DOB: 05/26/1941  Age: 78 y.o. MRN: [id number]  CC:  Chief Complaint  Patient presents with  . Follow-up    HPI [name] presents for fu of dm. Doing well with glucophage. PSA has stabilized. Cough has resolved. Co right hip pain. Sp replacement of left hip some years ago.  Patient asked for a refill of his gabapentin.  He is taking 200 mg successfully for nighttime lower back pain and restless legs.  Patient asked for refill of his Xanax.  He has had the same prescription for the last 7 months and rarely takes it.  Past Medical History:  Diagnosis Date  . 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 Aortic atherosclerosis ([place])   . Biceps muscle tear    right  . CAD (coronary artery disease)    anterior ischemia on a stress perfusion study.  (Catheterization in 2007, demonstrating 95% mid-LAD stenosis.  The circumflex has 20% proximal stenosis, the right coronary artery had 20% mid stenosis.  The EF was 60%.  He had a non drug eluting stent placed.    . Diverticulosis of colon (without mention of hemorrhage)   . Dyspnea    mild with extreme exertion  . Esophageal reflux   . Esophageal stricture   . History of bronchitis   . HLD (hyperlipidemia)   . Hot flashes   . Malignant neoplasm of prostate ([place])   . Osteoarthrosis, unspecified whether generalized or localized, unspecified site   . Osteoarthrosis, unspecified whether generalized or localized, unspecified site   . Other and unspecified hyperlipidemia   . Pulmonary nodule    has resolved  . Recurrent aspiration bronchitis/pneumonia ([place])   . Skin cancer   . Spinal stenosis   . Type 2 diabetes mellitus ([place])   . Unspecified essential hypertension     Past Surgical History:  Procedure Laterality Date  . CARDIAC CATHETERIZATION  2007  . COLONOSCOPY  03/13/2013  . DISTAL BICEPS TENDON REPAIR Right 03/12/2015   Procedure: REPAIR/RECONSTRUCTION  RIGHT BICEPS TENDON;  Surgeon: Daryll Brod, MD;  Location: [facility];  Service: Orthopedics;  Laterality: Right;  . ESOPHAGOGASTRODUODENOSCOPY  2011  . eyelid surgery for Lid Lag    . FOOT SURGERY    . HYDROCELE EXCISION / REPAIR    . INGUINAL HERNIA REPAIR     78 yrs old  . MASTECTOMY Left    benign tumor  . Prostate treatment     cancer  . SKIN CANCER EXCISION  2012   skin cancer on right ear  . TOTAL HIP ARTHROPLASTY Left 10/23/2017   Procedure: LEFT TOTAL HIP ARTHROPLASTY ANTERIOR APPROACH;  Surgeon: Gaynelle Arabian, MD;  Location: [facility];  Service: Orthopedics;  Laterality: Left;    Family History  Problem Relation Age of Onset  . Prostate cancer Father   . Prostate cancer Brother   . Colon cancer Neg Hx     Social History   Socioeconomic History  . Marital status: Married    Spouse name: Not on file  . Number of children: 1  . Years of education: Not on file  . Highest education level: Not on file  Occupational History  . Occupation: Psychologist, occupational  Social Needs  . Financial resource strain: Not on file  . Food insecurity    Worry: Not on file    Inability: Not on file  . Transportation  needs    Medical: Not on file    Non-medical: Not on file  Tobacco Use  . Smoking status: Former Smoker    Packs/day: 1.00    Years: 18.00    Pack years: 18.00    Types: Cigarettes    Quit date: 04/24/1977    Years since quitting: 41.7  . Smokeless tobacco: Former Systems developer    Types: Snuff, Chew  Substance and Sexual Activity  . Alcohol use: Yes    Alcohol/week: 12.0 standard drinks    Types: 12 Cans of beer per week    Comment: 1-2 beers a day  . Drug use: No  . Sexual activity: Not on file  Lifestyle  . Physical activity    Days per week: Not on file    Minutes per session: Not on file  . Stress: Not on file  Relationships  . Social Herbalist on phone: Not on file    Gets together: Not on file    Attends religious service: Not on file     Active member of club or organization: Not on file    Attends meetings of clubs or organizations: Not on file    Relationship status: Not on file  . Intimate partner violence    Fear of current or ex partner: Not on file    Emotionally abused: Not on file    Physically abused: Not on file    Forced sexual activity: Not on file  Other Topics Concern  . Not on file  Social History Narrative  . Not on file    Outpatient Medications Prior to Visit  Medication Sig Dispense Refill  . acetaminophen (TYLENOL) 500 MG tablet Take 500 mg by mouth 3 (three) times daily as needed (for pain.).    Marland Kitchen amLODipine (NORVASC) 5 MG tablet TAKE 1 TABLET(5 MG) BY MOUTH DAILY 30 tablet 11  . fluticasone (FLONASE) 50 MCG/ACT nasal spray Place 2 sprays into both nostrils daily. 16 g 6  . lisinopril (PRINIVIL,ZESTRIL) 10 MG tablet TAKE 1 TABLET BY MOUTH EVERY DAY 90 tablet 0  . Magnesium 400 MG CAPS Take 400 mg by mouth daily.     . metFORMIN (GLUCOPHAGE-XR) 500 MG 24 hr tablet Take 1 tablet (500 mg total) by mouth 2 (two) times daily at 8 am and 10 pm. 180 tablet 3  . methocarbamol (ROBAXIN) 500 MG tablet Take 1 tablet (500 mg total) by mouth every 6 (six) hours as needed for muscle spasms. 60 tablet 0  . omeprazole (PRILOSEC) 40 MG capsule TAKE ONE CAPSULE BY MOUTH DAILY 90 capsule 0  . pravastatin (PRAVACHOL) 80 MG tablet Take 1 tablet (80 mg total) by mouth daily. 90 tablet 3  . traMADol (ULTRAM) 50 MG tablet Take 1-2 tablets (50-100 mg total) by mouth every 6 (six) hours as needed (mild pain). 56 tablet 0  . ALPRAZolam (XANAX) 0.5 MG tablet Take 0.25-0.5 mg by mouth 3 (three) times daily as needed for anxiety.    . gabapentin (NEURONTIN) 100 MG capsule TAKE 2 CAPSULES BY MOUTH EVERY NIGHT AT BEDTIME 60 capsule 2  . doxycycline (VIBRA-TABS) 100 MG tablet Take 1 tablet (100 mg total) by mouth 2 (two) times daily. 20 tablet 0   No facility-administered medications prior to visit.     Allergies  Allergen  Reactions  . Simvastatin Other (See Comments)     pt states INTOL to [facility] w/ leg pains    ROS Review of Systems  Constitutional: Negative  for diaphoresis, fever and unexpected weight change.  Respiratory: Negative.   Cardiovascular: Negative.   Gastrointestinal: Negative.   Musculoskeletal: Positive for arthralgias and back pain.  Allergic/Immunologic: Negative for immunocompromised state.  Neurological: Negative for light-headedness and headaches.  Psychiatric/Behavioral: The patient is nervous/anxious.       Objective:    Physical Exam  Constitutional: He is oriented to person, place, and time. No distress.  Pulmonary/Chest: Effort normal.  Neurological: He is alert and oriented to person, place, and time.  Psychiatric: He has a normal mood and affect. His behavior is normal.    There were no vitals taken for this visit. Wt Readings from Last 3 Encounters:  10/04/18 185 lb 8 oz (84.1 kg)  08/27/18 188 lb (85.3 kg)  03/21/18 177 lb 6.4 oz (80.5 kg)   BP Readings from Last 3 Encounters:  10/04/18 136/80  08/27/18 126/80  03/21/18 132/78   Guideline developer:  UpToDate (see UpToDate for funding source) Date Released: June 2014  Health Maintenance Due  Topic Date Due  . FOOT EXAM  08/31/1950  . OPHTHALMOLOGY EXAM  08/31/1950  . PNA vac Low Risk Adult (2 of 2 - PPSV23) 08/13/2014    There are no preventive care reminders to display for this patient.  Lab Results  Component Value Date   TSH 0.74 03/21/2018   Lab Results  Component Value Date   WBC 7.1 10/02/2018   HGB 13.3 10/02/2018   HCT 39.1 10/02/2018   MCV 93.8 10/02/2018   PLT 164.0 10/02/2018   Lab Results  Component Value Date   NA 140 10/02/2018   K 4.5 10/02/2018   CO2 30 10/02/2018   GLUCOSE 191 (H) 10/02/2018   BUN 22 10/02/2018   CREATININE 0.84 10/02/2018   BILITOT 0.5 10/02/2018   ALKPHOS 63 10/02/2018   AST 17 10/02/2018   ALT 19 10/02/2018   PROT 6.6 10/02/2018   ALBUMIN 4.1  10/02/2018   CALCIUM 9.2 10/02/2018   ANIONGAP 10 10/24/2017   GFR 88.58 10/02/2018   Lab Results  Component Value Date   CHOL 144 10/02/2018   Lab Results  Component Value Date   HDL 41.50 10/02/2018   Lab Results  Component Value Date   LDLCALC 84 10/02/2018   Lab Results  Component Value Date   TRIG 92.0 10/02/2018   Lab Results  Component Value Date   CHOLHDL 3 10/02/2018   Lab Results  Component Value Date   HGBA1C 7.2 (H) 10/02/2018      Assessment & Plan:   Problem List Items Addressed This Visit      Endocrine   Diabetes mellitus type 2, controlled, without complications ([place])   Relevant Orders   Hemoglobin A1c     Other   Anxiety state   Relevant Medications   ALPRAZolam (XANAX) 0.5 MG tablet   Low back pain   Relevant Medications   gabapentin (NEURONTIN) 100 MG capsule    Other Visit Diagnoses    Right hip pain    -  Primary   Relevant Orders   DG Hip Unilat W OR W/O Pelvis 1V Right      Meds ordered this encounter  Medications  . gabapentin (NEURONTIN) 100 MG capsule    Sig: Take 2 capsules (200 mg total) by mouth at bedtime.    Dispense:  60 capsule    Refill:  2    Any further refills will need to be filled by PCP as directed at last office visit.  Marland Kitchen  ALPRAZolam (XANAX) 0.5 MG tablet    Sig: Take 0.5-1 tablets (0.25-0.5 mg total) by mouth 2 (two) times daily as needed for anxiety.    Dispense:  60 tablet    Refill:  0   Virtual Visit via Video Note  I connected with [name] on 01/04/19 at  8:00 AM EDT by a video enabled telemedicine application and verified that I am speaking with the correct person using two identifiers.  Location: Patient: home Provider:    I discussed the limitations of evaluation and management by telemedicine and the availability of in person appointments. The patient expressed understanding and agreed to proceed.  History of Present Illness:    Observations/Objective:   Assessment and Plan:    Follow Up Instructions:    I discussed the assessment and treatment plan with the patient. The patient was provided an opportunity to ask questions and all were answered. The patient agreed with the plan and demonstrated an understanding of the instructions.   The patient was advised to call back or seek an in-person evaluation if the symptoms worsen or if the condition fails to improve as anticipated.  I provided 20 minutes of non-face-to-face time during this encounter.   Libby Maw, MD  Follow-up: Return in about 3 months (around 04/06/2019).    Patient will call and make a follow-up appointment for lab work and an x-ray of the right hip.  Call and make an appointment to see me about the right hip pain.  Otherwise follow-up in 3 months.

## 2019-01-25 ENCOUNTER — Other Ambulatory Visit: Payer: Self-pay | Admitting: Pulmonary Disease

## 2019-01-31 ENCOUNTER — Ambulatory Visit (INDEPENDENT_AMBULATORY_CARE_PROVIDER_SITE_OTHER): Payer: Medicare Other

## 2019-01-31 ENCOUNTER — Other Ambulatory Visit: Payer: Self-pay

## 2019-01-31 ENCOUNTER — Other Ambulatory Visit (INDEPENDENT_AMBULATORY_CARE_PROVIDER_SITE_OTHER): Payer: Medicare Other

## 2019-01-31 DIAGNOSIS — M25551 Pain in right hip: Secondary | ICD-10-CM

## 2019-01-31 DIAGNOSIS — Z96641 Presence of right artificial hip joint: Secondary | ICD-10-CM | POA: Diagnosis not present

## 2019-01-31 DIAGNOSIS — M5136 Other intervertebral disc degeneration, lumbar region: Secondary | ICD-10-CM | POA: Diagnosis not present

## 2019-01-31 DIAGNOSIS — E119 Type 2 diabetes mellitus without complications: Secondary | ICD-10-CM

## 2019-02-01 LAB — HEMOGLOBIN A1C: Hgb A1c MFr Bld: 7.1 % — ABNORMAL HIGH (ref 4.6–6.5)

## 2019-03-16 ENCOUNTER — Other Ambulatory Visit: Payer: Self-pay | Admitting: Family Medicine

## 2019-03-19 DIAGNOSIS — L814 Other melanin hyperpigmentation: Secondary | ICD-10-CM | POA: Diagnosis not present

## 2019-03-19 DIAGNOSIS — L570 Actinic keratosis: Secondary | ICD-10-CM | POA: Diagnosis not present

## 2019-03-19 DIAGNOSIS — L578 Other skin changes due to chronic exposure to nonionizing radiation: Secondary | ICD-10-CM | POA: Diagnosis not present

## 2019-03-26 ENCOUNTER — Encounter: Payer: Self-pay | Admitting: Family Medicine

## 2019-03-26 ENCOUNTER — Other Ambulatory Visit: Payer: Self-pay

## 2019-03-26 ENCOUNTER — Ambulatory Visit (INDEPENDENT_AMBULATORY_CARE_PROVIDER_SITE_OTHER): Payer: Medicare Other | Admitting: Family Medicine

## 2019-03-26 VITALS — BP 120/80 | HR 69 | Ht 70.0 in | Wt 186.0 lb

## 2019-03-26 DIAGNOSIS — E119 Type 2 diabetes mellitus without complications: Secondary | ICD-10-CM

## 2019-03-26 DIAGNOSIS — F411 Generalized anxiety disorder: Secondary | ICD-10-CM | POA: Diagnosis not present

## 2019-03-26 DIAGNOSIS — I10 Essential (primary) hypertension: Secondary | ICD-10-CM | POA: Diagnosis not present

## 2019-03-26 DIAGNOSIS — Z23 Encounter for immunization: Secondary | ICD-10-CM | POA: Diagnosis not present

## 2019-03-26 NOTE — Progress Notes (Signed)
Established Patient Office Visit  Subjective:  Patient ID: [name], male    DOB: Jun 09, 1941  Age: 78 y.o. MRN: [id number]  CC:  Chief Complaint  Patient presents with   Follow-up    HPI [name] presents for follow-up of his diabetes, anxiety and hypertension.  Blood pressures been well controlled with the lisinopril and amlodipine.  He has done well status post increasing the metformin to 500 mg XR twice daily.  He is tolerating the drug and denies significant GI side effect.  Continues to take the Xanax mostly at nighttime.  Continues to work daily and stays mostly in his shop.  He does not go onto the field.  He is exposed to the people who work for him to go onto the field.  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  (Catheterization in 2007, demonstrating 95% mid-LAD stenosis.  The circumflex has 20% proximal stenosis, the right coronary artery had 20% mid stenosis.  The EF was 60%.  He had a non drug eluting stent placed.     Diverticulosis of colon (without mention of hemorrhage)    Dyspnea    mild with extreme exertion   Esophageal reflux    Esophageal stricture    History of bronchitis    HLD (hyperlipidemia)    Hot flashes    Malignant neoplasm of prostate (HCC)    Osteoarthrosis, unspecified whether generalized or localized, unspecified site    Osteoarthrosis, unspecified whether generalized or localized, unspecified site    Other and unspecified hyperlipidemia    Pulmonary nodule    has resolved   Recurrent aspiration bronchitis/pneumonia ([place])    Skin cancer    Spinal stenosis    Type 2 diabetes mellitus ([place])    Unspecified essential hypertension     Past Surgical History:  Procedure Laterality Date   CARDIAC CATHETERIZATION  2007   COLONOSCOPY   03/13/2013   DISTAL BICEPS TENDON REPAIR Right 03/12/2015   Procedure: REPAIR/RECONSTRUCTION RIGHT BICEPS TENDON;  Surgeon: Daryll Brod, MD;  Location: [facility];  Service: Orthopedics;  Laterality: Right;   ESOPHAGOGASTRODUODENOSCOPY  2011   eyelid surgery for Lid Lag     FOOT SURGERY     HYDROCELE EXCISION / REPAIR     INGUINAL HERNIA REPAIR     78 yrs old   MASTECTOMY Left    benign tumor   Prostate treatment     cancer   SKIN CANCER EXCISION  2012   skin cancer on right ear   TOTAL HIP ARTHROPLASTY Left 10/23/2017   Procedure: LEFT TOTAL HIP ARTHROPLASTY ANTERIOR APPROACH;  Surgeon: Gaynelle Arabian, MD;  Location: [facility];  Service: Orthopedics;  Laterality: Left;    Family History  Problem Relation Age of Onset   Prostate cancer Father    Prostate cancer Brother    Colon cancer Neg Hx     Social History   Socioeconomic History   Marital status: Married    Spouse name: Not on file   Number of children: 1   Years of education: Not on file   Highest education level: Not on file  Occupational History   Occupation: Psychologist, occupational  Social Needs   Financial resource strain: Not on file   Food insecurity  Worry: Not on file    Inability: Not on file   Transportation needs    Medical: Not on file    Non-medical: Not on file  Tobacco Use   Smoking status: Former Smoker    Packs/day: 1.00    Years: 18.00    Pack years: 18.00    Types: Cigarettes    Quit date: 04/24/1977    Years since quitting: 41.9   Smokeless tobacco: Former Systems developer    Types: Snuff, Chew  Substance and Sexual Activity   Alcohol use: Yes    Alcohol/week: 12.0 standard drinks    Types: 12 Cans of beer per week    Comment: 1-2 beers a day   Drug use: No   Sexual activity: Not on file  Lifestyle   Physical activity    Days per week: Not on file    Minutes per session: Not on file   Stress: Not on file  Relationships   Social connections    Talks on  phone: Not on file    Gets together: Not on file    Attends religious service: Not on file    Active member of club or organization: Not on file    Attends meetings of clubs or organizations: Not on file    Relationship status: Not on file   Intimate partner violence    Fear of current or ex partner: Not on file    Emotionally abused: Not on file    Physically abused: Not on file    Forced sexual activity: Not on file  Other Topics Concern   Not on file  Social History Narrative   Not on file    Outpatient Medications Prior to Visit  Medication Sig Dispense Refill   acetaminophen (TYLENOL) 500 MG tablet Take 500 mg by mouth 3 (three) times daily as needed (for pain.).     ALPRAZolam (XANAX) 0.5 MG tablet Take 0.5-1 tablets (0.25-0.5 mg total) by mouth 2 (two) times daily as needed for anxiety. 60 tablet 0   amLODipine (NORVASC) 5 MG tablet TAKE 1 TABLET(5 MG) BY MOUTH DAILY 30 tablet 11   fluticasone (FLONASE) 50 MCG/ACT nasal spray Place 2 sprays into both nostrils daily. 16 g 6   gabapentin (NEURONTIN) 100 MG capsule Take 2 capsules (200 mg total) by mouth at bedtime. 60 capsule 2   lisinopril (ZESTRIL) 10 MG tablet TAKE 1 TABLET BY MOUTH DAILY 90 tablet 0   Magnesium 400 MG CAPS Take 400 mg by mouth daily.      metFORMIN (GLUCOPHAGE-XR) 500 MG 24 hr tablet Take 1 tablet (500 mg total) by mouth 2 (two) times daily at 8 am and 10 pm. 180 tablet 3   methocarbamol (ROBAXIN) 500 MG tablet Take 1 tablet (500 mg total) by mouth every 6 (six) hours as needed for muscle spasms. 60 tablet 0   omeprazole (PRILOSEC) 40 MG capsule TAKE 1 CAPSULE BY MOUTH DAILY 90 capsule 0   pravastatin (PRAVACHOL) 80 MG tablet Take 1 tablet (80 mg total) by mouth daily. 90 tablet 3   traMADol (ULTRAM) 50 MG tablet Take 1-2 tablets (50-100 mg total) by mouth every 6 (six) hours as needed (mild pain). 56 tablet 0   No facility-administered medications prior to visit.     Allergies  Allergen  Reactions   Simvastatin Other (See Comments)     pt states INTOL to [facility] w/ leg pains    ROS Review of Systems  Constitutional: Negative.   HENT: Negative.  Eyes: Negative for photophobia and visual disturbance.  Respiratory: Negative.   Cardiovascular: Negative.   Gastrointestinal: Negative.   Endocrine: Negative for polyphagia and polyuria.  Genitourinary: Negative for frequency and urgency.  Skin: Negative for pallor and rash.  Neurological: Negative for light-headedness and headaches.  Hematological: Negative.   Psychiatric/Behavioral: Negative.       Objective:    Physical Exam  Constitutional: He is oriented to person, place, and time. He appears well-developed and well-nourished. No distress.  HENT:  Head: Normocephalic and atraumatic.  Right Ear: External ear normal.  Left Ear: External ear normal.  Mouth/Throat: Oropharynx is clear and moist. No oropharyngeal exudate.  Eyes: Pupils are equal, round, and reactive to light. Conjunctivae are normal. Right eye exhibits no discharge. Left eye exhibits no discharge. No scleral icterus.  Neck: Neck supple. No JVD present. No tracheal deviation present. No thyromegaly present.  Cardiovascular: Normal rate, regular rhythm and normal heart sounds.  Pulmonary/Chest: Effort normal and breath sounds normal. No stridor.  Abdominal: Bowel sounds are normal.  Lymphadenopathy:    He has no cervical adenopathy.  Neurological: He is alert and oriented to person, place, and time.  Skin: Skin is warm and dry. He is not diaphoretic.  Psychiatric: He has a normal mood and affect. His behavior is normal.    BP 120/80    Pulse 69    Ht 5\' 10"  (1.778 m)    Wt 186 lb (84.4 kg)    SpO2 98%    BMI 26.69 kg/m  Wt Readings from Last 3 Encounters:  03/26/19 186 lb (84.4 kg)  10/04/18 185 lb 8 oz (84.1 kg)  08/27/18 188 lb (85.3 kg)   BP Readings from Last 3 Encounters:  03/26/19 120/80  10/04/18 136/80  08/27/18 126/80   Guideline  developer:  UpToDate (see UpToDate for funding source) Date Released: June 2014  Health Maintenance Due  Topic Date Due   FOOT EXAM  08/31/1951   OPHTHALMOLOGY EXAM  [birth year]31/1950   PNA vac Low Risk Adult (2 of 2 - PPSV23) 08/13/2014   INFLUENZA VACCINE  02/16/2019    There are no preventive care reminders to display for this patient.  Lab Results  Component Value Date   TSH 0.74 03/21/2018   Lab Results  Component Value Date   WBC 7.1 10/02/2018   HGB 13.3 10/02/2018   HCT 39.1 10/02/2018   MCV 93.8 10/02/2018   PLT 164.0 10/02/2018   Lab Results  Component Value Date   NA 140 10/02/2018   K 4.5 10/02/2018   CO2 30 10/02/2018   GLUCOSE 191 (H) 10/02/2018   BUN 22 10/02/2018   CREATININE 0.84 10/02/2018   BILITOT 0.5 10/02/2018   ALKPHOS 63 10/02/2018   AST 17 10/02/2018   ALT 19 10/02/2018   PROT 6.6 10/02/2018   ALBUMIN 4.1 10/02/2018   CALCIUM 9.2 10/02/2018   ANIONGAP 10 10/24/2017   GFR 88.58 10/02/2018   Lab Results  Component Value Date   CHOL 144 10/02/2018   Lab Results  Component Value Date   HDL 41.50 10/02/2018   Lab Results  Component Value Date   LDLCALC 84 10/02/2018   Lab Results  Component Value Date   TRIG 92.0 10/02/2018   Lab Results  Component Value Date   CHOLHDL 3 10/02/2018   Lab Results  Component Value Date   HGBA1C 7.1 (H) 01/31/2019      Assessment & Plan:   Problem List Items Addressed This Visit  Cardiovascular and Mediastinum   Essential hypertension     Endocrine   Diabetes mellitus type 2, controlled, without complications ([place])   Relevant Orders   Basic metabolic panel   Hemoglobin A1c     Other   Anxiety state    Other Visit Diagnoses    Need for influenza vaccination    -  Primary   Relevant Orders   Flu Vaccine QUAD High Dose(Fluad) (Completed)      No orders of the defined types were placed in this encounter.   Follow-up: Return in about 6 months (around 09/23/2019).

## 2019-04-05 ENCOUNTER — Other Ambulatory Visit: Payer: Self-pay | Admitting: Family Medicine

## 2019-04-05 DIAGNOSIS — G8929 Other chronic pain: Secondary | ICD-10-CM

## 2019-04-05 DIAGNOSIS — M545 Low back pain, unspecified: Secondary | ICD-10-CM

## 2019-05-10 DIAGNOSIS — C61 Malignant neoplasm of prostate: Secondary | ICD-10-CM | POA: Diagnosis not present

## 2019-05-16 ENCOUNTER — Other Ambulatory Visit: Payer: Self-pay | Admitting: Family Medicine

## 2019-05-16 DIAGNOSIS — F411 Generalized anxiety disorder: Secondary | ICD-10-CM

## 2019-05-17 DIAGNOSIS — C778 Secondary and unspecified malignant neoplasm of lymph nodes of multiple regions: Secondary | ICD-10-CM | POA: Diagnosis not present

## 2019-05-17 DIAGNOSIS — C61 Malignant neoplasm of prostate: Secondary | ICD-10-CM | POA: Diagnosis not present

## 2019-05-20 ENCOUNTER — Other Ambulatory Visit: Payer: Self-pay | Admitting: Urology

## 2019-05-20 DIAGNOSIS — C61 Malignant neoplasm of prostate: Secondary | ICD-10-CM

## 2019-05-29 ENCOUNTER — Other Ambulatory Visit: Payer: Self-pay

## 2019-05-29 ENCOUNTER — Encounter (HOSPITAL_COMMUNITY)
Admission: RE | Admit: 2019-05-29 | Discharge: 2019-05-29 | Disposition: A | Discharge status: Home / Self Care | Payer: Medicare Other | Source: Ambulatory Visit | Attending: Urology | Admitting: Urology

## 2019-05-29 DIAGNOSIS — R918 Other nonspecific abnormal finding of lung field: Secondary | ICD-10-CM | POA: Diagnosis not present

## 2019-05-29 DIAGNOSIS — R9721 Rising PSA following treatment for malignant neoplasm of prostate: Secondary | ICD-10-CM | POA: Diagnosis not present

## 2019-05-29 DIAGNOSIS — C778 Secondary and unspecified malignant neoplasm of lymph nodes of multiple regions: Secondary | ICD-10-CM | POA: Diagnosis not present

## 2019-05-29 DIAGNOSIS — M899 Disorder of bone, unspecified: Secondary | ICD-10-CM | POA: Diagnosis not present

## 2019-05-29 DIAGNOSIS — C61 Malignant neoplasm of prostate: Secondary | ICD-10-CM | POA: Diagnosis not present

## 2019-05-29 LAB — GLUCOSE, CAPILLARY: Glucose-Capillary: 135 mg/dL — ABNORMAL HIGH (ref 70–99)

## 2019-05-29 MED ORDER — FLUDEOXYGLUCOSE F - 18 (FDG) INJECTION
9.3000 | Freq: Once | INTRAVENOUS | Status: AC | PRN
  Administered 2019-05-29: 9.3 via INTRAVENOUS

## 2019-06-11 ENCOUNTER — Other Ambulatory Visit: Payer: Self-pay | Admitting: Pulmonary Disease

## 2019-06-11 DIAGNOSIS — Z20828 Contact with and (suspected) exposure to other viral communicable diseases: Secondary | ICD-10-CM | POA: Diagnosis not present

## 2019-06-11 DIAGNOSIS — J3489 Other specified disorders of nose and nasal sinuses: Secondary | ICD-10-CM | POA: Diagnosis not present

## 2019-06-12 ENCOUNTER — Other Ambulatory Visit: Payer: Self-pay | Admitting: Pulmonary Disease

## 2019-06-18 ENCOUNTER — Other Ambulatory Visit: Payer: Self-pay

## 2019-06-18 MED ORDER — OMEPRAZOLE 40 MG PO CPDR: 40.0000 mg | DELAYED_RELEASE_CAPSULE | Freq: Every day | ORAL | 2 refills | Status: DC

## 2019-06-24 DIAGNOSIS — J3489 Other specified disorders of nose and nasal sinuses: Secondary | ICD-10-CM | POA: Diagnosis not present

## 2019-06-24 DIAGNOSIS — Z20828 Contact with and (suspected) exposure to other viral communicable diseases: Secondary | ICD-10-CM | POA: Diagnosis not present

## 2019-06-24 DIAGNOSIS — R05 Cough: Secondary | ICD-10-CM | POA: Diagnosis not present

## 2019-07-05 ENCOUNTER — Other Ambulatory Visit: Payer: Self-pay | Admitting: Family Medicine

## 2019-07-05 DIAGNOSIS — M545 Low back pain, unspecified: Secondary | ICD-10-CM

## 2019-07-05 DIAGNOSIS — G8929 Other chronic pain: Secondary | ICD-10-CM

## 2019-07-18 DIAGNOSIS — N5201 Erectile dysfunction due to arterial insufficiency: Secondary | ICD-10-CM | POA: Diagnosis not present

## 2019-07-18 DIAGNOSIS — C61 Malignant neoplasm of prostate: Secondary | ICD-10-CM | POA: Diagnosis not present

## 2019-07-18 DIAGNOSIS — C7801 Secondary malignant neoplasm of right lung: Secondary | ICD-10-CM | POA: Diagnosis not present

## 2019-07-18 DIAGNOSIS — C7951 Secondary malignant neoplasm of bone: Secondary | ICD-10-CM | POA: Diagnosis not present

## 2019-07-31 DIAGNOSIS — H35033 Hypertensive retinopathy, bilateral: Secondary | ICD-10-CM | POA: Diagnosis not present

## 2019-07-31 DIAGNOSIS — H5213 Myopia, bilateral: Secondary | ICD-10-CM | POA: Diagnosis not present

## 2019-07-31 DIAGNOSIS — H43393 Other vitreous opacities, bilateral: Secondary | ICD-10-CM | POA: Diagnosis not present

## 2019-08-27 ENCOUNTER — Other Ambulatory Visit: Payer: Self-pay | Admitting: Family Medicine

## 2019-08-27 DIAGNOSIS — F411 Generalized anxiety disorder: Secondary | ICD-10-CM

## 2019-09-14 IMAGING — DX DG CHEST 2V
2 series · 2 of 2 positions shown · non-contrast
Comparison: Chest x-ray 12/29/2015, PET-CT 02/20/2014

CLINICAL DATA: 76-year-old male with a history of routine chest
x-ray and no current complaints.

Multiple lung nodules present on prior PET-CT and CT studies in 4666
with biopsy performed of left lower lobe nodule 03/06/2014.
EXAM:
CHEST  2 VIEW

[chest pa]
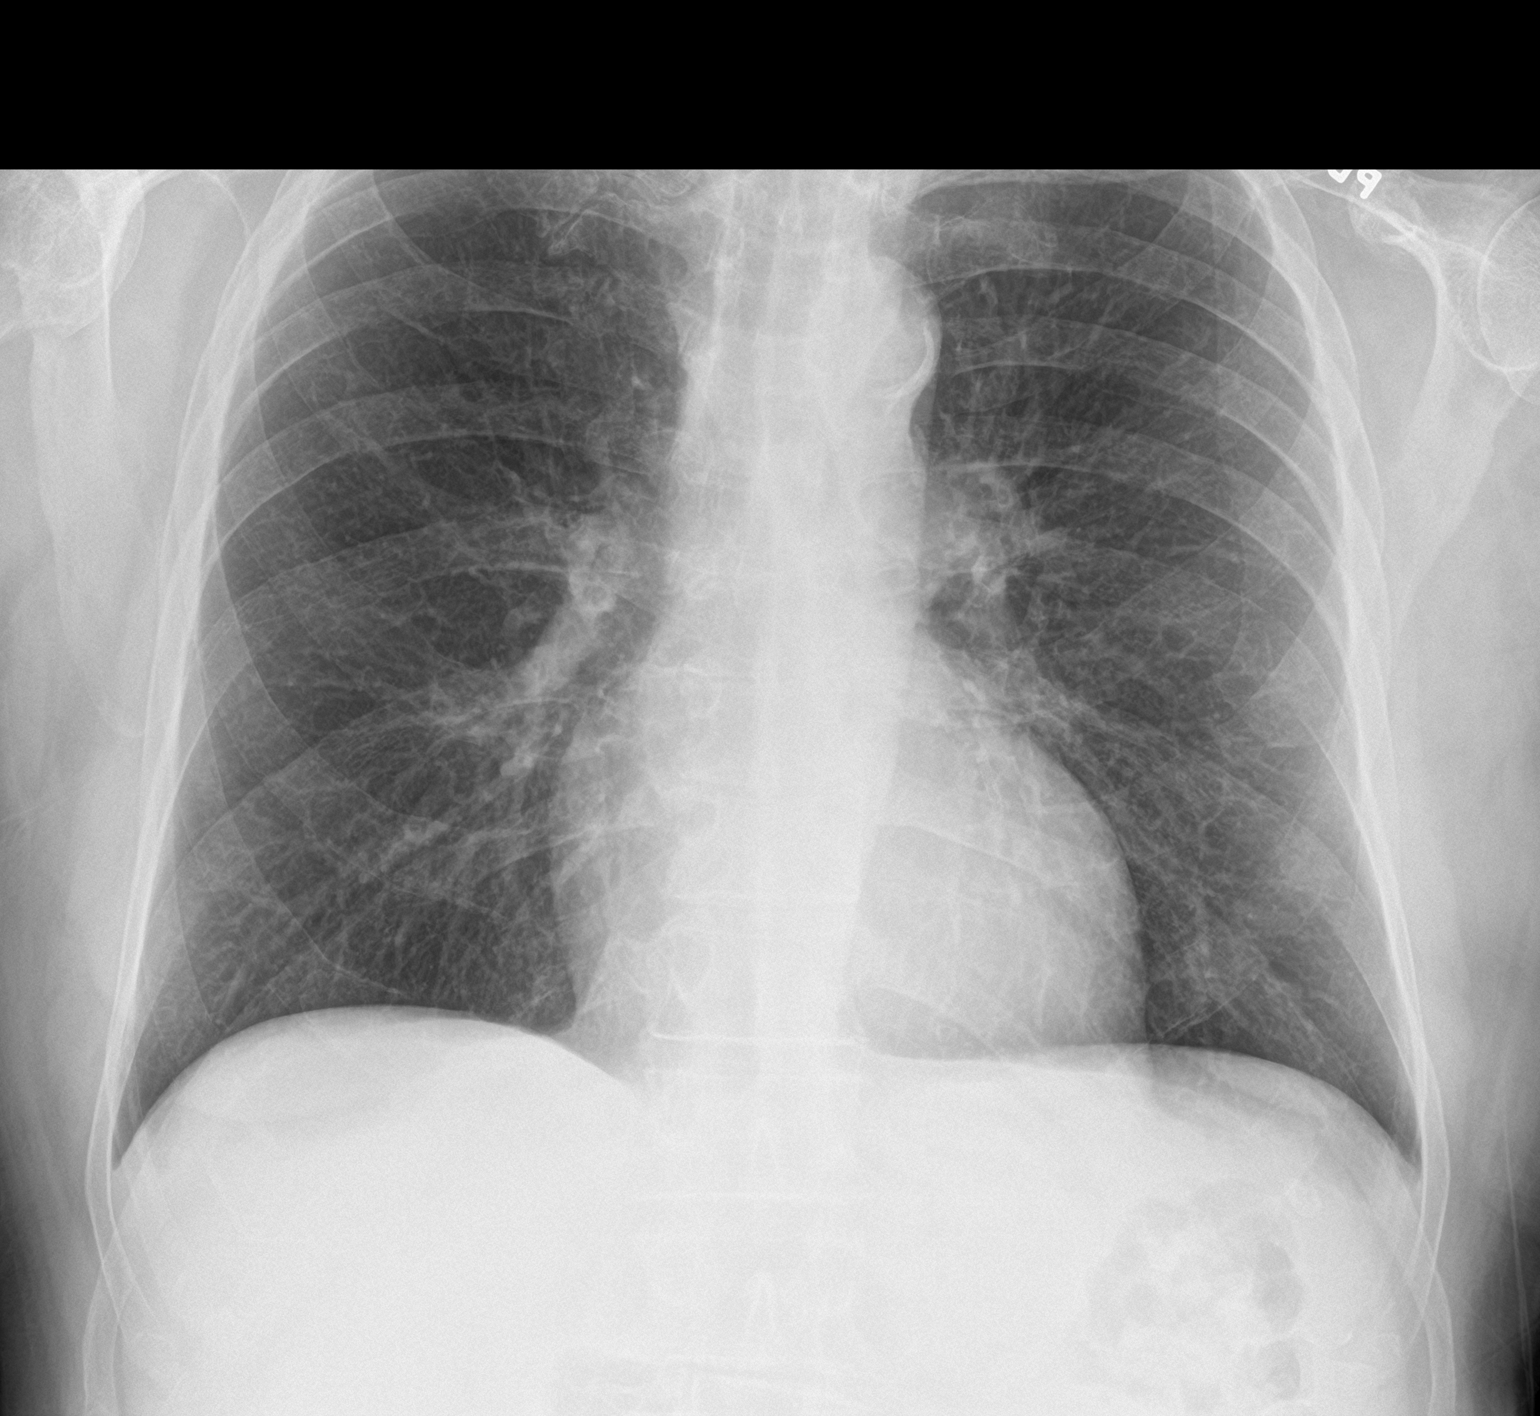

[chest lat]
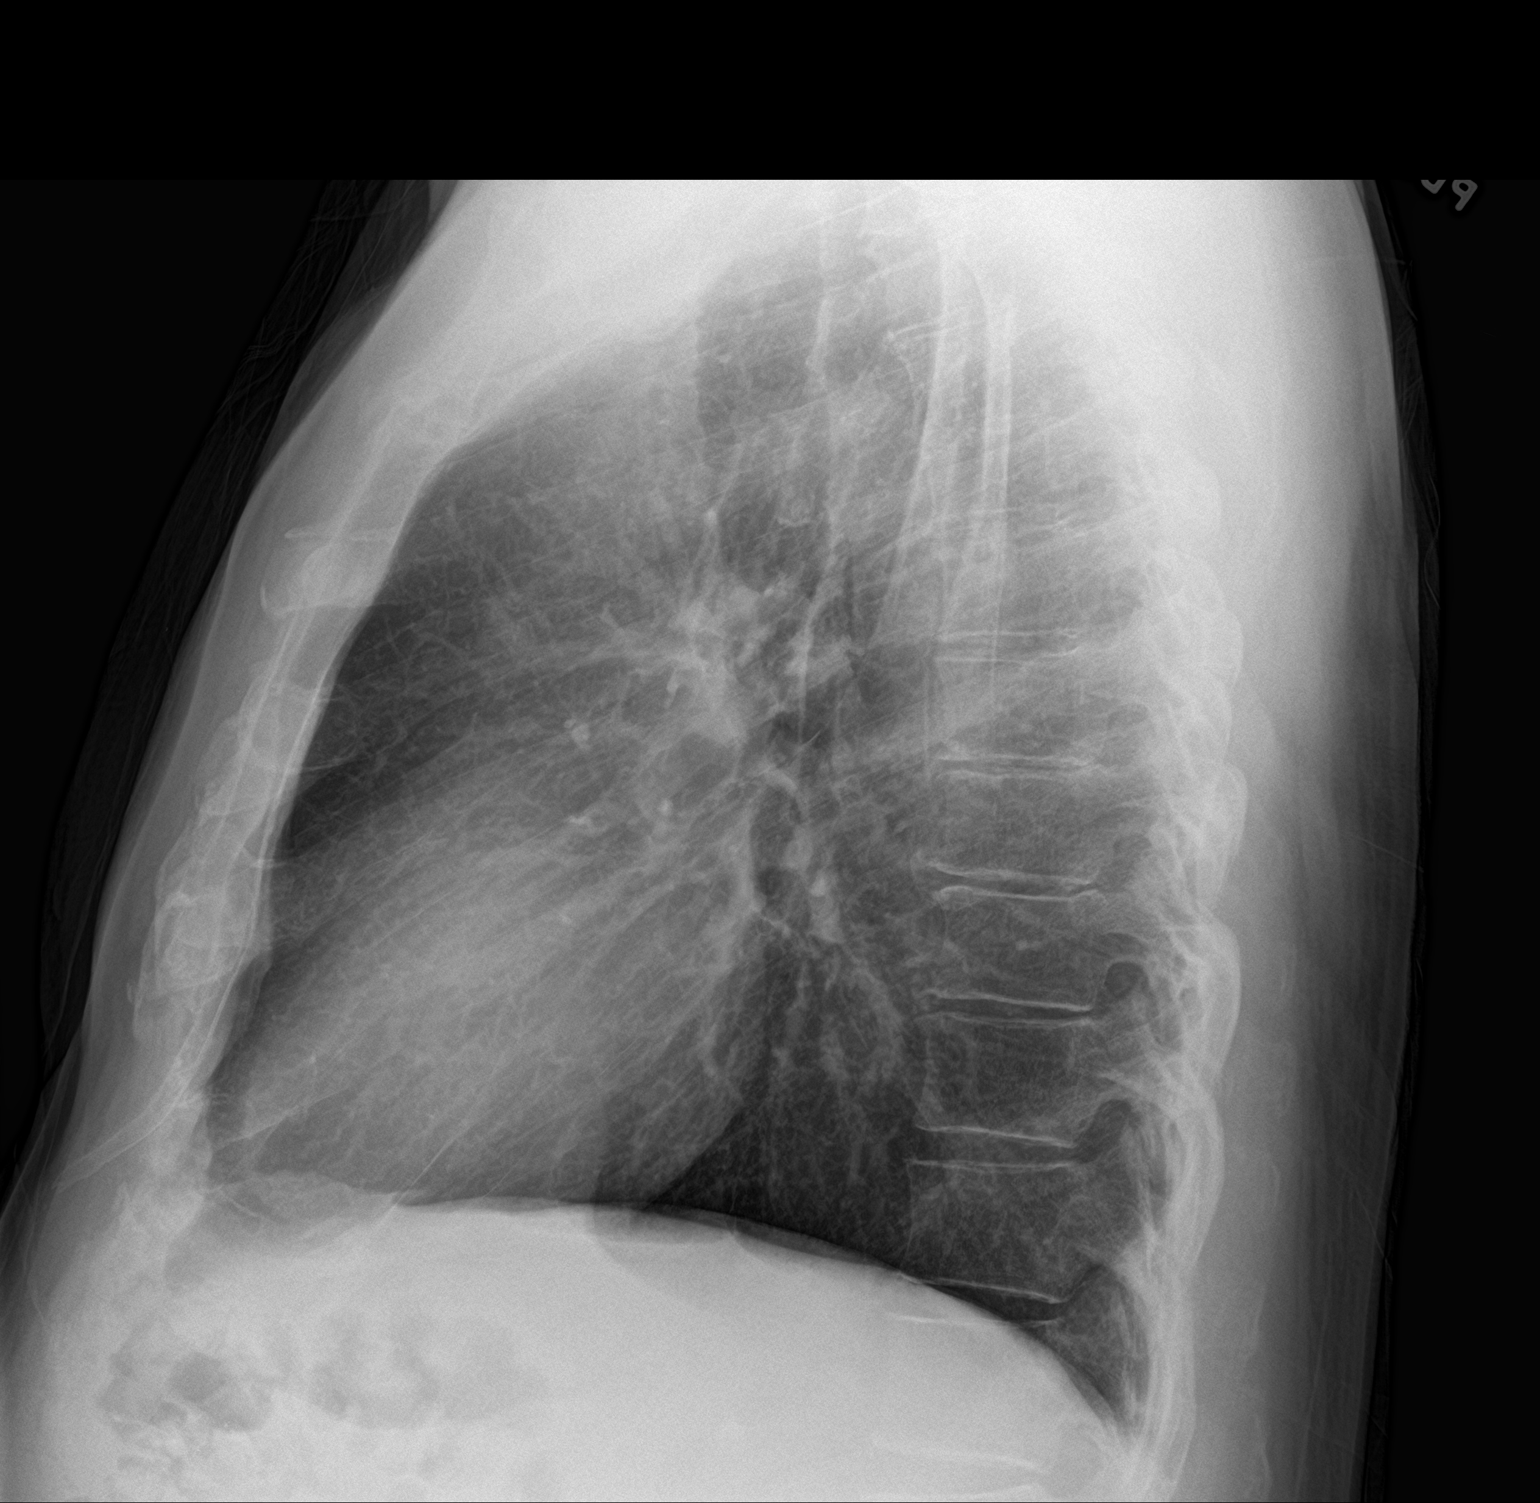

[2 of 2 positions shown; findings below may reference images not displayed]

FINDINGS: Cardiomediastinal silhouette unchanged in size and contour. No
evidence of central vascular congestion. No pneumothorax or pleural
effusion. No interlobular septal thickening. No confluent airspace
disease.

No lung nodules identified on the current chest x-ray.

No displaced fracture.  Degenerative changes of the spine.
IMPRESSION: Negative for acute cardiopulmonary disease.

No lung nodules identified on the current chest x-ray.

## 2019-09-18 DIAGNOSIS — Z0120 Encounter for dental examination and cleaning without abnormal findings: Secondary | ICD-10-CM | POA: Diagnosis not present

## 2019-10-01 ENCOUNTER — Telehealth: Payer: Self-pay | Admitting: Family Medicine

## 2019-10-01 ENCOUNTER — Other Ambulatory Visit: Payer: Self-pay | Admitting: Family Medicine

## 2019-10-01 NOTE — Telephone Encounter (Signed)
Patient is calling and requesting a refill for Amlodipine be sent to [facility] in [place]. CB is [phone]

## 2019-10-01 NOTE — Telephone Encounter (Signed)
Rx sent in, patient aware  

## 2019-10-09 ENCOUNTER — Other Ambulatory Visit: Payer: Self-pay | Admitting: Family Medicine

## 2019-10-09 DIAGNOSIS — G8929 Other chronic pain: Secondary | ICD-10-CM

## 2019-10-09 DIAGNOSIS — M545 Low back pain, unspecified: Secondary | ICD-10-CM

## 2019-11-11 ENCOUNTER — Telehealth: Payer: Self-pay | Admitting: Family Medicine

## 2019-11-11 NOTE — Progress Notes (Signed)
°  Chronic Care Management   Outreach Note  11/11/2019 Name: [name] MRN: [id number] DOB: [birth year]  Referred by: Libby Maw, MD Reason for referral : No chief complaint on file.   An unsuccessful telephone outreach was attempted today. The patient was referred to the pharmacist for assistance with care management and care coordination.   This note is not being shared with the patient for the following reason: To respect privacy (The patient or proxy has requested that the information not be shared).  Follow Up Plan:   Earney Hamburg Upstream Scheduler

## 2019-11-14 DIAGNOSIS — C61 Malignant neoplasm of prostate: Secondary | ICD-10-CM | POA: Diagnosis not present

## 2019-11-14 DIAGNOSIS — E559 Vitamin D deficiency, unspecified: Secondary | ICD-10-CM | POA: Diagnosis not present

## 2019-11-21 DIAGNOSIS — Z5111 Encounter for antineoplastic chemotherapy: Secondary | ICD-10-CM | POA: Diagnosis not present

## 2019-11-21 DIAGNOSIS — C61 Malignant neoplasm of prostate: Secondary | ICD-10-CM | POA: Diagnosis not present

## 2019-11-21 DIAGNOSIS — C7951 Secondary malignant neoplasm of bone: Secondary | ICD-10-CM | POA: Diagnosis not present

## 2019-11-22 ENCOUNTER — Other Ambulatory Visit: Payer: Self-pay | Admitting: Family Medicine

## 2019-11-22 ENCOUNTER — Other Ambulatory Visit: Payer: Self-pay | Admitting: Physician Assistant

## 2019-11-22 DIAGNOSIS — E119 Type 2 diabetes mellitus without complications: Secondary | ICD-10-CM

## 2019-11-22 DIAGNOSIS — E782 Mixed hyperlipidemia: Secondary | ICD-10-CM

## 2019-11-22 NOTE — Telephone Encounter (Signed)
Last OV 03/26/19 Last fill 10/04/18  #180/3

## 2019-12-08 ENCOUNTER — Other Ambulatory Visit: Payer: Self-pay | Admitting: Family Medicine

## 2019-12-08 DIAGNOSIS — J301 Allergic rhinitis due to pollen: Secondary | ICD-10-CM

## 2019-12-08 DIAGNOSIS — R059 Cough, unspecified: Secondary | ICD-10-CM

## 2019-12-20 DIAGNOSIS — C7951 Secondary malignant neoplasm of bone: Secondary | ICD-10-CM | POA: Diagnosis not present

## 2019-12-20 DIAGNOSIS — C61 Malignant neoplasm of prostate: Secondary | ICD-10-CM | POA: Diagnosis not present

## 2019-12-25 ENCOUNTER — Other Ambulatory Visit: Payer: Self-pay | Admitting: Family Medicine

## 2019-12-25 DIAGNOSIS — F411 Generalized anxiety disorder: Secondary | ICD-10-CM

## 2019-12-25 NOTE — Telephone Encounter (Signed)
[facility]-Plz see refill req/thx dmf

## 2019-12-26 ENCOUNTER — Telehealth: Payer: Self-pay | Admitting: Family Medicine

## 2019-12-26 NOTE — Progress Notes (Signed)
°  Chronic Care Management   Outreach Note  12/26/2019 Name: [name] MRN: [id number] DOB: 29-Dec-1940  Referred by: Libby Maw, MD Reason for referral : No chief complaint on file.   An unsuccessful telephone outreach was attempted today. The patient was referred to the pharmacist for assistance with care management and care coordination.   This note is not being shared with the patient for the following reason: To respect privacy (The patient or proxy has requested that the information not be shared).  Follow Up Plan:   Earney Hamburg Upstream Scheduler

## 2020-01-21 DIAGNOSIS — C61 Malignant neoplasm of prostate: Secondary | ICD-10-CM | POA: Diagnosis not present

## 2020-01-21 DIAGNOSIS — C7951 Secondary malignant neoplasm of bone: Secondary | ICD-10-CM | POA: Diagnosis not present

## 2020-01-30 ENCOUNTER — Other Ambulatory Visit: Payer: Self-pay

## 2020-01-31 ENCOUNTER — Encounter: Payer: Self-pay | Admitting: Family Medicine

## 2020-01-31 ENCOUNTER — Ambulatory Visit (INDEPENDENT_AMBULATORY_CARE_PROVIDER_SITE_OTHER): Payer: Medicare Other | Admitting: Family Medicine

## 2020-01-31 VITALS — BP 138/74 | HR 55 | Temp 97.6°F | Ht 70.0 in | Wt 187.4 lb

## 2020-01-31 DIAGNOSIS — Z7289 Other problems related to lifestyle: Secondary | ICD-10-CM

## 2020-01-31 DIAGNOSIS — E7800 Pure hypercholesterolemia, unspecified: Secondary | ICD-10-CM | POA: Diagnosis not present

## 2020-01-31 DIAGNOSIS — Z789 Other specified health status: Secondary | ICD-10-CM

## 2020-01-31 DIAGNOSIS — E119 Type 2 diabetes mellitus without complications: Secondary | ICD-10-CM

## 2020-01-31 DIAGNOSIS — I10 Essential (primary) hypertension: Secondary | ICD-10-CM

## 2020-01-31 DIAGNOSIS — I2510 Atherosclerotic heart disease of native coronary artery without angina pectoris: Secondary | ICD-10-CM

## 2020-01-31 LAB — URINALYSIS, ROUTINE W REFLEX MICROSCOPIC
Bilirubin Urine: NEGATIVE
Hgb urine dipstick: NEGATIVE
Ketones, ur: NEGATIVE
Leukocytes,Ua: NEGATIVE
Nitrite: NEGATIVE
Specific Gravity, Urine: 1.025 (ref 1.000–1.030)
Total Protein, Urine: NEGATIVE
Urine Glucose: NEGATIVE
Urobilinogen, UA: 0.2 (ref 0.0–1.0)
WBC, UA: NONE SEEN — AB (ref 0–?)
pH: 5.5 (ref 5.0–8.0)

## 2020-01-31 LAB — LIPID PANEL
Cholesterol: 198 mg/dL (ref 0–200)
HDL: 42.8 mg/dL (ref >39.00)
LDL Cholesterol: 116 mg/dL — ABNORMAL HIGH (ref 0–99)
NonHDL: 155.53
Total CHOL/HDL Ratio: 5
Triglycerides: 197 mg/dL — ABNORMAL HIGH (ref 0.0–149.0)
VLDL: 39.4 mg/dL (ref 0.0–40.0)

## 2020-01-31 LAB — COMPREHENSIVE METABOLIC PANEL
ALT: 18 U/L (ref 0–53)
AST: 16 U/L (ref 0–37)
Albumin: 4.3 g/dL (ref 3.5–5.2)
Alkaline Phosphatase: 66 U/L (ref 39–117)
BUN: 18 mg/dL (ref 6–23)
CO2: 27 mEq/L (ref 19–32)
Calcium: 9.1 mg/dL (ref 8.4–10.5)
Chloride: 104 mEq/L (ref 96–112)
Creatinine, Ser: 0.83 mg/dL (ref 0.40–1.50)
GFR: 89.51 mL/min (ref >60.00)
Glucose, Bld: 155 mg/dL — ABNORMAL HIGH (ref 70–99)
Potassium: 4.3 mEq/L (ref 3.5–5.1)
Sodium: 139 mEq/L (ref 135–145)
Total Bilirubin: 0.6 mg/dL (ref 0.2–1.2)
Total Protein: 6.3 g/dL (ref 6.0–8.3)

## 2020-01-31 LAB — CBC
HCT: 38.6 % — ABNORMAL LOW (ref 39.0–52.0)
Hemoglobin: 13.3 g/dL (ref 13.0–17.0)
MCHC: 34.4 g/dL (ref 30.0–36.0)
MCV: 93.6 fl (ref 78.0–100.0)
Platelets: 164 10*3/uL (ref 150.0–400.0)
RBC: 4.12 Mil/uL — ABNORMAL LOW (ref 4.22–5.81)
RDW: 13.2 % (ref 11.5–15.5)
WBC: 6.4 10*3/uL (ref 4.0–10.5)

## 2020-01-31 LAB — MICROALBUMIN / CREATININE URINE RATIO
Creatinine,U: 101.7 mg/dL
Microalb Creat Ratio: 0.7 mg/g (ref 0.0–30.0)
Microalb, Ur: <0.7 mg/dL (ref 0.0–1.9)

## 2020-01-31 LAB — HEMOGLOBIN A1C: Hgb A1c MFr Bld: 7.2 % — ABNORMAL HIGH (ref 4.6–6.5)

## 2020-01-31 LAB — LDL CHOLESTEROL, DIRECT: Direct LDL: 117 mg/dL

## 2020-01-31 NOTE — Progress Notes (Addendum)
Established Patient Office Visit  Subjective:  Patient ID: [name], male    DOB: 1940-09-19  Age: 79 y.o. MRN: [id number]  CC:  Chief Complaint  Patient presents with  . Annual Exam    CPE, no concerns.     HPI [name] presents for follow-up of hypertension, elevated cholesterol, diabetes.  No longer taking Xanax.  Continues to have 1 or 2 beers daily.  Blood pressure has been well controlled with amlodipine and lisinopril.  No issues taking the pravastatin.  Continues treatment for prostate cancer with urology.  No new lesion was found on his hip.  Continues with targeted therapy for this issue.  Continues to work some.  Dilated eye exam back in February.  Past Medical History:  Diagnosis Date  . 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 Aortic atherosclerosis ([place])   . Biceps muscle tear    right  . CAD (coronary artery disease)    anterior ischemia on a stress perfusion study.  (Catheterization in 2007, demonstrating 95% mid-LAD stenosis.  The circumflex has 20% proximal stenosis, the right coronary artery had 20% mid stenosis.  The EF was 60%.  He had a non drug eluting stent placed.    . Diverticulosis of colon (without mention of hemorrhage)   . Dyspnea    mild with extreme exertion  . Esophageal reflux   . Esophageal stricture   . History of bronchitis   . HLD (hyperlipidemia)   . Hot flashes   . Malignant neoplasm of prostate ([place])   . Osteoarthrosis, unspecified whether generalized or localized, unspecified site   . Osteoarthrosis, unspecified whether generalized or localized, unspecified site   . Other and unspecified hyperlipidemia   . Pulmonary nodule    has resolved  . Recurrent aspiration bronchitis/pneumonia   . Skin cancer   . Spinal stenosis   . Type 2 diabetes mellitus ([place])   . Unspecified essential hypertension     Past Surgical History:  Procedure Laterality Date  . CARDIAC CATHETERIZATION  2007  . COLONOSCOPY  03/13/2013  . DISTAL BICEPS  TENDON REPAIR Right 03/12/2015   Procedure: REPAIR/RECONSTRUCTION RIGHT BICEPS TENDON;  Surgeon: Daryll Brod, MD;  Location: [facility];  Service: Orthopedics;  Laterality: Right;  . ESOPHAGOGASTRODUODENOSCOPY  2011  . eyelid surgery for Lid Lag    . FOOT SURGERY    . HYDROCELE EXCISION / REPAIR    . INGUINAL HERNIA REPAIR     79 yrs old  . MASTECTOMY Left    benign tumor  . Prostate treatment     cancer  . SKIN CANCER EXCISION  2012   skin cancer on right ear  . TOTAL HIP ARTHROPLASTY Left 10/23/2017   Procedure: LEFT TOTAL HIP ARTHROPLASTY ANTERIOR APPROACH;  Surgeon: Gaynelle Arabian, MD;  Location: [facility];  Service: Orthopedics;  Laterality: Left;    Family History  Problem Relation Age of Onset  . Prostate cancer Father   . Prostate cancer Brother   . Colon cancer Neg Hx     Social History   Socioeconomic History  . Marital status: Married    Spouse name: Not on file  . Number of children: 1  . Years of education: Not on file  . Highest education level: Not on file  Occupational History  . Occupation: Psychologist, occupational  Tobacco Use  . Smoking status: Former Smoker    Packs/day: 1.00    Years: 18.00    Pack years:  18.00    Types: Cigarettes    Quit date: 04/24/1977    Years since quitting: 42.8  . Smokeless tobacco: Former Systems developer    Types: Snuff, Chew  Vaping Use  . Vaping Use: Never used  Substance and Sexual Activity  . Alcohol use: Yes    Alcohol/week: 12.0 standard drinks    Types: 12 Cans of beer per week    Comment: 1-2 beers a day  . Drug use: No  . Sexual activity: Not on file  Other Topics Concern  . Not on file  Social History Narrative  . Not on file   Social Determinants of Health   Financial Resource Strain:   . Difficulty of Paying Living Expenses:   Food Insecurity:   . Worried About Charity fundraiser in the Last Year:   . Arboriculturist in the Last Year:   Transportation Needs:   . Film/video editor (Medical):   Marland Kitchen  Lack of Transportation (Non-Medical):   Physical Activity:   . Days of Exercise per Week:   . Minutes of Exercise per Session:   Stress:   . Feeling of Stress :   Social Connections:   . Frequency of Communication with Friends and Family:   . Frequency of Social Gatherings with Friends and Family:   . Attends Religious Services:   . Active Member of Clubs or Organizations:   . Attends Archivist Meetings:   Marland Kitchen Marital Status:   Intimate Partner Violence:   . Fear of Current or Ex-Partner:   . Emotionally Abused:   Marland Kitchen Physically Abused:   . Sexually Abused:     Outpatient Medications Prior to Visit  Medication Sig Dispense Refill  . acetaminophen (TYLENOL) 500 MG tablet Take 500 mg by mouth 3 (three) times daily as needed (for pain.).    Marland Kitchen amLODipine (NORVASC) 5 MG tablet TAKE 1 TABLET BY MOUTH DAILY 90 tablet 2  . fluticasone (FLONASE) 50 MCG/ACT nasal spray SHAKE LIQUID AND USE 2 SPRAYS IN EACH NOSTRIL DAILY 16 g 6  . gabapentin (NEURONTIN) 100 MG capsule TAKE 2 CAPSULES(200 MG) BY MOUTH AT BEDTIME 60 capsule 2  . lisinopril (ZESTRIL) 10 MG tablet TAKE 1 TABLET BY MOUTH DAILY 90 tablet 0  . Magnesium 400 MG CAPS Take 400 mg by mouth daily.     . metFORMIN (GLUCOPHAGE-XR) 500 MG 24 hr tablet TAKE 1 TABLET(500 MG) BY MOUTH TWICE DAILY AT 8 AM AND AT 10 PM 180 tablet 0  . methocarbamol (ROBAXIN) 500 MG tablet Take 1 tablet (500 mg total) by mouth every 6 (six) hours as needed for muscle spasms. 60 tablet 0  . omeprazole (PRILOSEC) 40 MG capsule Take 1 capsule (40 mg total) by mouth daily. 90 capsule 2  . pravastatin (PRAVACHOL) 80 MG tablet Take 1 tablet (80 mg total) by mouth daily. 90 tablet 3  . traMADol (ULTRAM) 50 MG tablet Take 1-2 tablets (50-100 mg total) by mouth every 6 (six) hours as needed (mild pain). 56 tablet 0  . Omega-3 Fatty Acids (FISH OIL) 1000 MG CAPS Take by mouth.    . ALPRAZolam (XANAX) 0.5 MG tablet TAKE 1/2 TO 1 TABLET(0.25 TO 0.5 MG) BY MOUTH TWICE  DAILY AS NEEDED FOR ANXIETY (Patient not taking: Reported on 01/31/2020) 60 tablet 0   No facility-administered medications prior to visit.    Allergies  Allergen Reactions  . Simvastatin Other (See Comments)     pt states INTOL to [facility]  w/ leg pains    ROS Review of Systems  Constitutional: Negative.   HENT: Negative.   Respiratory: Negative.  Negative for chest tightness.   Cardiovascular: Negative.  Negative for chest pain.  Gastrointestinal: Negative.   Endocrine: Negative for polyphagia.  Genitourinary: Negative.   Musculoskeletal: Negative for arthralgias, gait problem and joint swelling.  Skin: Negative for pallor and rash.  Allergic/Immunologic: Negative for immunocompromised state.  Neurological: Negative for speech difficulty and light-headedness.  Hematological: Does not bruise/bleed easily.  Psychiatric/Behavioral: Negative.       Objective:    Physical Exam Vitals and nursing note reviewed.  Constitutional:      General: He is not in acute distress.    Appearance: Normal appearance. He is normal weight. He is not ill-appearing or toxic-appearing.  HENT:     Head: Normocephalic and atraumatic.     Right Ear: Tympanic membrane, ear canal and external ear normal. There is no impacted cerumen.     Left Ear: Tympanic membrane, ear canal and external ear normal. There is no impacted cerumen.     Mouth/Throat:     Mouth: Mucous membranes are moist.     Pharynx: Oropharynx is clear. No oropharyngeal exudate or posterior oropharyngeal erythema.  Eyes:     General: No scleral icterus.       Right eye: No discharge.        Left eye: No discharge.  Cardiovascular:     Rate and Rhythm: Normal rate and regular rhythm.     Pulses:          Dorsalis pedis pulses are 2+ on the right side and 2+ on the left side.       Posterior tibial pulses are 1+ on the right side and 1+ on the left side.  Pulmonary:     Effort: Pulmonary effort is normal.     Breath sounds: Normal  breath sounds.  Abdominal:     General: Bowel sounds are normal.  Musculoskeletal:     Cervical back: No rigidity or tenderness.     Right lower leg: No edema.     Left lower leg: No edema.  Lymphadenopathy:     Cervical: No cervical adenopathy.  Neurological:     General: No focal deficit present.     Mental Status: He is alert and oriented to person, place, and time.  Psychiatric:        Mood and Affect: Mood normal.        Behavior: Behavior normal.    Diabetic Foot Exam - Simple   Simple Foot Form Visual Inspection See comments: Yes Sensation Testing Intact to touch and monofilament testing bilaterally: Yes Pulse Check Posterior Tibialis and Dorsalis pulse intact bilaterally: Yes Comments  Feet are cavus bilaterally.  There are no lesions or rashes.     BP 138/74   Pulse (!) 55   Temp 97.6 F (36.4 C) (Tympanic)   Ht 5\' 10"  (1.778 m)   Wt 187 lb 6.4 oz (85 kg)   SpO2 97%   BMI 26.89 kg/m  Wt Readings from Last 3 Encounters:  01/31/20 187 lb 6.4 oz (85 kg)  03/26/19 186 lb (84.4 kg)  10/04/18 185 lb 8 oz (84.1 kg)     Health Maintenance Due  Topic Date Due  . Hepatitis C Screening  Never done  . FOOT EXAM  Never done  . COVID-19 Vaccine (1) Never done  . PNA vac Low Risk Adult (2 of 2 - PPSV23) 08/13/2014  .  INFLUENZA VACCINE  02/16/2020    There are no preventive care reminders to display for this patient.  Lab Results  Component Value Date   TSH 0.74 03/21/2018   Lab Results  Component Value Date   WBC 6.4 01/31/2020   HGB 13.3 01/31/2020   HCT 38.6 (L) 01/31/2020   MCV 93.6 01/31/2020   PLT 164.0 01/31/2020   Lab Results  Component Value Date   NA 139 01/31/2020   K 4.3 01/31/2020   CO2 27 01/31/2020   GLUCOSE 155 (H) 01/31/2020   BUN 18 01/31/2020   CREATININE 0.83 01/31/2020   BILITOT 0.6 01/31/2020   ALKPHOS 66 01/31/2020   AST 16 01/31/2020   ALT 18 01/31/2020   PROT 6.3 01/31/2020   ALBUMIN 4.3 01/31/2020   CALCIUM 9.1  01/31/2020   ANIONGAP 10 10/24/2017   GFR 89.51 01/31/2020   Lab Results  Component Value Date   CHOL 198 01/31/2020   Lab Results  Component Value Date   HDL 42.80 01/31/2020   Lab Results  Component Value Date   LDLCALC 116 (H) 01/31/2020   Lab Results  Component Value Date   TRIG 197.0 (H) 01/31/2020   Lab Results  Component Value Date   CHOLHDL 5 01/31/2020   Lab Results  Component Value Date   HGBA1C 7.2 (H) 01/31/2020      Assessment & Plan:   Problem List Items Addressed This Visit      Cardiovascular and Mediastinum   Essential hypertension - Primary   Relevant Medications   rosuvastatin (CRESTOR) 20 MG tablet   Other Relevant Orders   CBC (Completed)   Comprehensive metabolic panel (Completed)   Urinalysis, Routine w reflex microscopic (Completed)   Microalbumin / creatinine urine ratio (Completed)   Atherosclerosis of native coronary artery of native heart without angina pectoris   Relevant Medications   rosuvastatin (CRESTOR) 20 MG tablet   Other Relevant Orders   LDL cholesterol, direct (Completed)   Lipid panel (Completed)   LDL cholesterol, direct     Endocrine   Controlled type 2 diabetes mellitus without complication, without long-term current use of insulin (HCC)   Relevant Medications   rosuvastatin (CRESTOR) 20 MG tablet   Other Relevant Orders   Hemoglobin A1c (Completed)   Urinalysis, Routine w reflex microscopic (Completed)   Microalbumin / creatinine urine ratio (Completed)     Other   Elevated cholesterol   Relevant Medications   rosuvastatin (CRESTOR) 20 MG tablet   Other Relevant Orders   LDL cholesterol, direct (Completed)   Lipid panel (Completed)   LDL cholesterol, direct   Alcohol use      Meds ordered this encounter  Medications  . rosuvastatin (CRESTOR) 20 MG tablet    Sig: Take 1 tablet (20 mg total) by mouth daily.    Dispense:  90 tablet    Refill:  0    Ldl needs to be lower. Please ask to hold  pravastatin while taking.    Follow-up: Return in about 6 months (around 08/02/2020).   Continue with all medicines as above.  He knows that there are alternative sleep medicines. Libby Maw, MD   Trial of rosuvastatin. Hold pravastatin while taking. 8/6.

## 2020-02-21 MED ORDER — ROSUVASTATIN CALCIUM 20 MG PO TABS: 20.0000 mg | ORAL_TABLET | Freq: Every day | ORAL | 0 refills | Status: DC

## 2020-02-21 NOTE — Addendum Note (Signed)
Addended by: Jon Billings on: 02/21/2020 07:59 AM   Modules accepted: Orders

## 2020-02-22 ENCOUNTER — Other Ambulatory Visit: Payer: Self-pay | Admitting: Family Medicine

## 2020-02-22 DIAGNOSIS — M545 Low back pain, unspecified: Secondary | ICD-10-CM

## 2020-02-22 DIAGNOSIS — G8929 Other chronic pain: Secondary | ICD-10-CM

## 2020-02-27 ENCOUNTER — Other Ambulatory Visit: Payer: Self-pay | Admitting: Family Medicine

## 2020-02-27 DIAGNOSIS — E119 Type 2 diabetes mellitus without complications: Secondary | ICD-10-CM

## 2020-02-27 DIAGNOSIS — C7951 Secondary malignant neoplasm of bone: Secondary | ICD-10-CM | POA: Diagnosis not present

## 2020-02-27 DIAGNOSIS — C61 Malignant neoplasm of prostate: Secondary | ICD-10-CM | POA: Diagnosis not present

## 2020-03-09 ENCOUNTER — Other Ambulatory Visit: Payer: Self-pay | Admitting: Family Medicine

## 2020-03-31 DIAGNOSIS — C61 Malignant neoplasm of prostate: Secondary | ICD-10-CM | POA: Diagnosis not present

## 2020-03-31 DIAGNOSIS — C7951 Secondary malignant neoplasm of bone: Secondary | ICD-10-CM | POA: Diagnosis not present

## 2020-04-21 DIAGNOSIS — C7951 Secondary malignant neoplasm of bone: Secondary | ICD-10-CM | POA: Diagnosis not present

## 2020-04-21 DIAGNOSIS — C61 Malignant neoplasm of prostate: Secondary | ICD-10-CM | POA: Diagnosis not present

## 2020-05-19 ENCOUNTER — Other Ambulatory Visit: Payer: Self-pay | Admitting: Family Medicine

## 2020-05-19 DIAGNOSIS — I2510 Atherosclerotic heart disease of native coronary artery without angina pectoris: Secondary | ICD-10-CM

## 2020-05-19 DIAGNOSIS — E7800 Pure hypercholesterolemia, unspecified: Secondary | ICD-10-CM

## 2020-05-19 NOTE — Telephone Encounter (Signed)
Last OV 01/31/20 Last fill 02/21/20  #90/0

## 2020-05-22 ENCOUNTER — Telehealth: Payer: Self-pay | Admitting: Family Medicine

## 2020-05-22 ENCOUNTER — Other Ambulatory Visit: Payer: Self-pay | Admitting: Family Medicine

## 2020-05-22 DIAGNOSIS — G8929 Other chronic pain: Secondary | ICD-10-CM

## 2020-05-22 DIAGNOSIS — M545 Low back pain, unspecified: Secondary | ICD-10-CM

## 2020-05-28 DIAGNOSIS — C61 Malignant neoplasm of prostate: Secondary | ICD-10-CM | POA: Diagnosis not present

## 2020-05-28 NOTE — Telephone Encounter (Signed)
Left message for patient to schedule Annual Wellness Visit.  Please schedule with Nurse Health Advisor Martha Stanley, RN at [facility]  °

## 2020-06-04 DIAGNOSIS — Z5111 Encounter for antineoplastic chemotherapy: Secondary | ICD-10-CM | POA: Diagnosis not present

## 2020-06-04 DIAGNOSIS — C7802 Secondary malignant neoplasm of left lung: Secondary | ICD-10-CM | POA: Diagnosis not present

## 2020-06-04 DIAGNOSIS — C7951 Secondary malignant neoplasm of bone: Secondary | ICD-10-CM | POA: Diagnosis not present

## 2020-06-04 DIAGNOSIS — C61 Malignant neoplasm of prostate: Secondary | ICD-10-CM | POA: Diagnosis not present

## 2020-06-22 ENCOUNTER — Other Ambulatory Visit: Payer: Self-pay | Admitting: Family Medicine

## 2020-06-22 NOTE — Telephone Encounter (Signed)
Last OV 01/31/20 Last fill 10/01/19  #90/2

## 2020-07-01 ENCOUNTER — Telehealth: Payer: Self-pay | Admitting: Family Medicine

## 2020-07-01 NOTE — Telephone Encounter (Signed)
Left message for patient to schedule Annual Wellness Visit.  Please schedule with Nurse Health Advisor Martha Stanley, RN at [facility]  °

## 2020-07-07 DIAGNOSIS — C61 Malignant neoplasm of prostate: Secondary | ICD-10-CM | POA: Diagnosis not present

## 2020-07-07 DIAGNOSIS — C7951 Secondary malignant neoplasm of bone: Secondary | ICD-10-CM | POA: Diagnosis not present

## 2020-08-07 DIAGNOSIS — C61 Malignant neoplasm of prostate: Secondary | ICD-10-CM | POA: Diagnosis not present

## 2020-08-07 DIAGNOSIS — C7951 Secondary malignant neoplasm of bone: Secondary | ICD-10-CM | POA: Diagnosis not present

## 2020-08-21 ENCOUNTER — Other Ambulatory Visit: Payer: Self-pay | Admitting: Family Medicine

## 2020-08-21 DIAGNOSIS — E7800 Pure hypercholesterolemia, unspecified: Secondary | ICD-10-CM

## 2020-08-21 DIAGNOSIS — I2510 Atherosclerotic heart disease of native coronary artery without angina pectoris: Secondary | ICD-10-CM

## 2020-08-25 DIAGNOSIS — C61 Malignant neoplasm of prostate: Secondary | ICD-10-CM | POA: Diagnosis not present

## 2020-09-10 DIAGNOSIS — C61 Malignant neoplasm of prostate: Secondary | ICD-10-CM | POA: Diagnosis not present

## 2020-09-10 DIAGNOSIS — C7951 Secondary malignant neoplasm of bone: Secondary | ICD-10-CM | POA: Diagnosis not present

## 2020-09-10 DIAGNOSIS — C7802 Secondary malignant neoplasm of left lung: Secondary | ICD-10-CM | POA: Diagnosis not present

## 2020-09-18 ENCOUNTER — Telehealth: Payer: Self-pay | Admitting: Family Medicine

## 2020-09-18 NOTE — Progress Notes (Signed)
°  Chronic Care Management   Outreach Note  09/18/2020 Name: [name] MRN: [id number] DOB: [birth year]  Referred by: Libby Maw, MD Reason for referral : No chief complaint on file.   An unsuccessful telephone outreach was attempted today. The patient was referred to the pharmacist for assistance with care management and care coordination.   Follow Up Plan:   Carley Perdue [employer] Scheduler

## 2020-09-18 NOTE — Progress Notes (Signed)
  Chronic Care Management   Note  09/18/2020 Name: NOLYN SWAB MRN: [id number] DOB: 29-Jan-1941  ARLYNN MCDERMID is a 80 y.o. year old male who is a primary care patient of Libby Maw, MD. I reached out to Duffy Rhody by phone today in response to a referral sent by Mr. Lash Matulich [facility] PCP, Libby Maw, MD.   Mr. Tapp was given information about Chronic Care Management services today including:  1. CCM service includes personalized support from designated clinical staff supervised by his physician, including individualized plan of care and coordination with other care providers 2. 24/7 contact phone numbers for assistance for urgent and routine care needs. 3. Service will only be billed when office clinical staff spend 20 minutes or more in a month to coordinate care. 4. Only one practitioner may furnish and bill the service in a calendar month. 5. The patient may stop CCM services at any time (effective at the end of the month) by phone call to the office staff.   Patient agreed to services and verbal consent obtained.   Follow up plan:   Carley Perdue [employer] Scheduler

## 2020-09-25 ENCOUNTER — Other Ambulatory Visit: Payer: Self-pay | Admitting: Family

## 2020-09-25 DIAGNOSIS — M545 Low back pain, unspecified: Secondary | ICD-10-CM

## 2020-10-05 ENCOUNTER — Ambulatory Visit: Payer: Medicare Other

## 2020-10-05 NOTE — Progress Notes (Deleted)
Chronic Care Management Pharmacy Note  10/05/2020 Name:  [name] MRN:  [id number] DOB:  1941-04-20  Subjective: [name] is an 80 y.o. year old male who is a primary patient of [name] Hal Mortimer Fries, MD.  The CCM team was consulted for assistance with disease management and care coordination needs.    Engaged with patient by telephone for initial visit in response to provider referral for pharmacy case management and/or care coordination services.   Consent to Services:  The patient was given the following information about Chronic Care Management services today, agreed to services, and gave verbal consent: 1. CCM service includes personalized support from designated clinical staff supervised by the primary care provider, including individualized plan of care and coordination with other care providers 2. 24/7 contact phone numbers for assistance for urgent and routine care needs. 3. Service will only be billed when office clinical staff spend 20 minutes or more in a month to coordinate care. 4. Only one practitioner may furnish and bill the service in a calendar month. 5.The patient may stop CCM services at any time (effective at the end of the month) by phone call to the office staff. 6. The patient will be responsible for cost sharing (co-pay) of up to 20% of the service fee (after annual deductible is met). Patient agreed to services and consent obtained.  Patient Care Team: Libby Maw, MD as PCP - General (Family Medicine) Noralee Space, MD (Pulmonary Disease) Germaine Pomfret, [facility] as Pharmacist (Pharmacist)  Recent office visits: 01/31/20: Patient presented to Dr. Ethelene Hal for follow-up. A1c stable at 7.2%. LDL worsened to 116. Patient started on rosuvastatin. Alprazolam, tramadol stopped.   Recent consult visits: ***  Hospital visits: {Hospital DC Yes/No:25215}  Objective:  Lab Results  Component Value Date   CREATININE 0.83 01/31/2020   BUN 18  01/31/2020   GFR 89.51 01/31/2020   GFRNONAA >60 10/24/2017   GFRAA >60 10/24/2017   NA 139 01/31/2020   K 4.3 01/31/2020   CALCIUM 9.1 01/31/2020   CO2 27 01/31/2020   GLUCOSE 155 (H) 01/31/2020    Lab Results  Component Value Date/Time   HGBA1C 7.2 (H) 01/31/2020 10:15 AM   HGBA1C 7.1 (H) 01/31/2019 02:46 PM   GFR 89.51 01/31/2020 10:15 AM   GFR 88.58 10/02/2018 08:12 AM   MICROALBUR <0.7 01/31/2020 10:15 AM   MICROALBUR 0.7 10/02/2018 08:12 AM    Last diabetic Eye exam: No results found for: HMDIABEYEEXA  Last diabetic Foot exam: No results found for: HMDIABFOOTEX   Lab Results  Component Value Date   CHOL 198 01/31/2020   HDL 42.80 01/31/2020   LDLCALC 116 (H) 01/31/2020   LDLDIRECT 117.0 01/31/2020   TRIG 197.0 (H) 01/31/2020   CHOLHDL 5 01/31/2020    Hepatic Function Latest Ref Rng & Units 01/31/2020 10/02/2018 03/21/2018  Total Protein 6.0 - 8.3 g/dL 6.3 6.6 6.7  Albumin 3.5 - 5.2 g/dL 4.3 4.1 4.4  AST 0 - 37 U/L _0 ALT 0 - 53 U/L _1 Alk Phosphatase 39 - 117 U/L 66 63 62  Total Bilirubin 0.2 - 1.2 mg/dL 0.6 0.5 0.8  Bilirubin, Direct 0.0 - 0.3 mg/dL - - -    Lab Results  Component Value Date/Time   TSH 0.74 03/21/2018 10:28 AM   TSH 2.09 09/19/2017 08:21 AM    CBC Latest Ref Rng & Units 01/31/2020 10/02/2018 03/21/2018  WBC 4.0 - 10.5 K/uL 6.4 7.1 7.2  Hemoglobin 13.0 - 17.0 g/dL 54.2 48.1 44.3  Hematocrit 39.0 - 52.0 % 38.6(L) 39.1 38.9(L)  Platelets 150.0 - 400.0 K/uL 164.0 164.0 191.0    No results found for: VD25OH  Clinical ASCVD: Yes  The 10-year ASCVD risk score Denman George DC Jr., et al., 2013) is: 63.3%   Values used to calculate the score:     Age: 41 years     Sex: Male     Is Non-Hispanic African American: No     Diabetic: Yes     Tobacco smoker: No     Systolic Blood Pressure: 138 mmHg     Is BP treated: Yes     HDL Cholesterol: 42.8 mg/dL     Total Cholesterol: 198 mg/dL    Depression screen [facility] 2/9 01/31/2020 01/31/2020  02/05/2013  Decreased Interest 0 0 0  Down, Depressed, Hopeless 0 0 0  PHQ - 2 Score 0 0 0  Altered sleeping 0 - -  Tired, decreased energy 1 - -  Change in appetite 0 - -  Feeling bad or failure about yourself  0 - -  Trouble concentrating 0 - -  Moving slowly or fidgety/restless 0 - -  Suicidal thoughts 0 - -  PHQ-9 Score 1 - -  Difficult doing work/chores Not difficult at all - -    Social History   Tobacco Use  Smoking Status Former Smoker  . Packs/day: 1.00  . Years: 18.00  . Pack years: 18.00  . Types: Cigarettes  . Quit date: 04/24/1977  . Years since quitting: 43.4  Smokeless Tobacco Former Neurosurgeon  . Types: Snuff, Chew   BP Readings from Last 3 Encounters:  01/31/20 138/74  03/26/19 120/80  10/04/18 136/80   Pulse Readings from Last 3 Encounters:  01/31/20 (!) 55  03/26/19 69  10/04/18 64   Wt Readings from Last 3 Encounters:  01/31/20 187 lb 6.4 oz (85 kg)  03/26/19 186 lb (84.4 kg)  10/04/18 185 lb 8 oz (84.1 kg)   BMI Readings from Last 3 Encounters:  01/31/20 26.89 kg/m  03/26/19 26.69 kg/m  10/23/18 26.62 kg/m    Assessment/Interventions: Review of patient past medical history, allergies, medications, health status, including review of consultants reports, laboratory and other test data, was performed as part of comprehensive evaluation and provision of chronic care management services.   SDOH:  (Social Determinants of Health) assessments and interventions performed: Yes   CCM Care Plan  Allergies  Allergen Reactions  . Simvastatin Other (See Comments)     pt states INTOL to ZOCOR w/ leg pains    Medications Reviewed Today    Reviewed by Mliss Sax, MD (Physician) on 01/31/20 at 1014  Med List Status: <None>  Medication Order Taking? Sig Documenting Provider Last Dose Status Informant  acetaminophen (TYLENOL) 500 MG tablet [id number] Yes Take 500 mg by mouth 3 (three) times daily as needed (for pain.). [provider]  Taking Active Self  amLODipine (NORVASC) 5 MG tablet [id number] Yes TAKE 1 TABLET BY MOUTH DAILY Mliss Sax, MD Taking Active   fluticasone [facility]) 50 MCG/ACT nasal spray [id number] Yes SHAKE LIQUID AND USE 2 SPRAYS IN EACH NOSTRIL DAILY Mliss Sax, MD Taking Active   gabapentin (NEURONTIN) 100 MG capsule [id number] Yes TAKE 2 CAPSULES(200 MG) BY MOUTH AT BEDTIME Mliss Sax, MD Taking Active   lisinopril (ZESTRIL) 10 MG tablet [id number] Yes TAKE 1 TABLET BY MOUTH DAILY Mliss Sax, MD Taking Active   Magnesium  400 MG CAPS [id number] Yes Take 400 mg by mouth daily.  [provider] Taking Active Self  metFORMIN (GLUCOPHAGE-XR) 500 MG 24 hr tablet [id number] Yes TAKE 1 TABLET(500 MG) BY MOUTH TWICE DAILY AT 8 AM AND AT 10 PM Libby Maw, MD Taking Active   methocarbamol (ROBAXIN) 500 MG tablet [id number] Yes Take 1 tablet (500 mg total) by mouth every 6 (six) hours as needed for muscle spasms. Perkins, Alexzandrew L, PA-C Taking Active   Omega-3 Fatty Acids (FISH OIL) 1000 MG CAPS [id number]  Take by mouth. [provider]  Active   omeprazole (PRILOSEC) 40 MG capsule [id number] Yes Take 1 capsule (40 mg total) by mouth daily. Libby Maw, MD Taking Active   pravastatin (PRAVACHOL) 80 MG tablet [id number] Yes Take 1 tablet (80 mg total) by mouth daily. Almyra Deforest, [place] Taking Active           Patient Active Problem List   Diagnosis Date Noted  . Alcohol use 01/31/2020  . Lower respiratory infection 10/23/2018  . Seasonal allergic rhinitis due to pollen 10/04/2018  . Cough 10/04/2018  . OA (osteoarthritis) of hip 10/23/2017  . Controlled type 2 diabetes mellitus without complication, without long-term current use of insulin ([place]) 06/29/2017  . Low back pain 06/29/2016  . Right leg pain 06/18/2014  . Anxiety state 02/05/2013  . DYSPHAGIA UNSPECIFIED 11/17/2009  . DYSPHAGIA 11/17/2009  . OVERWEIGHT 11/24/2008  .  Essential hypertension 08/27/2007  . Chronic bronchitis ([place]) 08/27/2007  . 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 ([place]) 08/27/2007  . GERD 08/27/2007  . Diverticulosis of large intestine 08/27/2007  . Osteoarthritis 08/27/2007  . PROSTATE CANCER 06/19/2007  . Elevated cholesterol 06/19/2007  . 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 Fluad Quad(high Dose 65+) 03/26/2019  . Influenza Split 05/08/2012, 06/03/2013  . Influenza Whole 08/19/2009  . Influenza, High Dose Seasonal PF 06/29/2016, 06/29/2017, 06/08/2018  . Influenza,inj,Quad PF,6+ Mos 04/18/2014, 06/01/2015  . Influenza,inj,quad, With Preservative 06/17/2017  . Pneumococcal Conjugate-13 08/13/2013  . Tdap 02/10/2014    Conditions to be addressed/monitored:  Hypertension, Hyperlipidemia, Diabetes, Coronary Artery Disease and Osteoarthritis  There are no care plans that you recently modified to display for this patient.    Medication Assistance: {MEDASSISTANCEINFO:25044}  Patient's preferred pharmacy is:  [facility] [address] [place] - [address] [place] [address] Phone: [phone] Fax: [phone]  [facility] DR[facility] STORE [place], [place] - [address] AT [place] [place] [address] Phone: [phone] Fax: [phone]  Uses pill box? {Yes or If no, why not?:20788} Pt endorses ***% compliance  We discussed: {Pharmacy options:24294} Patient decided to: {US Pharmacy Plan:23885}  Care Plan and Follow Up Patient Decision:  {FOLLOWUP:24991}  Plan: {CM FOLLOW UP PLAN:25073}  ***   Current Barriers:  . {pharmacybarriers:24917} . ***  Pharmacist Clinical Goal(s):  Marland Kitchen Patient will {PHARMACYGOALCHOICES:24921} through collaboration with PharmD and provider.  . ***  Interventions: . 1:1 collaboration with Libby Maw, MD regarding  development and update of comprehensive plan of care as evidenced by provider attestation and co-signature . Inter-disciplinary care team collaboration (see longitudinal plan of care) . Comprehensive medication review performed; medication list updated in electronic medical record  Hypertension (BP goal {CHL HP UPSTREAM Pharmacist BP ranges:[phone]}) -{US controlled/uncontrolled:25276} -Current treatment: . Amlodipine 5 mg daily  . Lisinopril 10 mg daily  -  Medications previously tried: ***  -Current home readings: *** -Current dietary habits: *** -Current exercise habits: *** -{ACTIONS;DENIES/REPORTS:21021675::"Denies"} hypotensive/hypertensive symptoms -Educated on {CCM BP Counseling:25124} -Counseled to monitor BP at home ***, document, and provide log at future appointments -{CCMPHARMDINTERVENTION:25122}  Hyperlipidemia: (LDL goal < ***) -{US controlled/uncontrolled:25276} -Current treatment: . Pravastatin 80 mg daily  . Rosuvastatin 20 mg daily (newer) -Medications previously tried: ***  -Current dietary patterns: *** -Current exercise habits: *** -Educated on {CCM HLD Counseling:25126} -{CCMPHARMDINTERVENTION:25122}  Diabetes (A1c goal {A1c goals:23924}) -{US controlled/uncontrolled:25276} -Current medications: Marland Kitchen Metformin XR 500 mg twice daily  -Medications previously tried: ***  -Current home glucose readings . fasting glucose: *** . post prandial glucose: *** -{ACTIONS;DENIES/REPORTS:21021675::"Denies"} hypoglycemic/hyperglycemic symptoms -Current meal patterns:  . breakfast: ***  . lunch: ***  . dinner: *** . snacks: *** . drinks: *** -Current exercise: *** -Educated on {CCM DM COUNSELING:25123} -Counseled to check feet daily and get yearly eye exams -{CCMPHARMDINTERVENTION:25122}   Patient Goals/Self-Care Activities . Patient will:  - {pharmacypatientgoals:24919}  Follow Up Plan: {CM FOLLOW UP AEPN:37505}

## 2020-10-27 DIAGNOSIS — D485 Neoplasm of uncertain behavior of skin: Secondary | ICD-10-CM | POA: Diagnosis not present

## 2020-10-27 DIAGNOSIS — L570 Actinic keratosis: Secondary | ICD-10-CM | POA: Diagnosis not present

## 2020-11-24 ENCOUNTER — Other Ambulatory Visit: Payer: Self-pay | Admitting: Family

## 2020-11-24 DIAGNOSIS — I2510 Atherosclerotic heart disease of native coronary artery without angina pectoris: Secondary | ICD-10-CM

## 2020-11-24 DIAGNOSIS — E7800 Pure hypercholesterolemia, unspecified: Secondary | ICD-10-CM

## 2020-11-27 ENCOUNTER — Other Ambulatory Visit: Payer: Self-pay

## 2020-11-27 ENCOUNTER — Other Ambulatory Visit: Payer: Self-pay | Admitting: Family Medicine

## 2020-11-27 DIAGNOSIS — C7951 Secondary malignant neoplasm of bone: Secondary | ICD-10-CM | POA: Diagnosis not present

## 2020-11-27 DIAGNOSIS — E119 Type 2 diabetes mellitus without complications: Secondary | ICD-10-CM

## 2020-11-27 DIAGNOSIS — C61 Malignant neoplasm of prostate: Secondary | ICD-10-CM | POA: Diagnosis not present

## 2020-11-27 MED ORDER — METFORMIN HCL ER 500 MG PO TB24: ORAL_TABLET | ORAL | 0 refills | Status: DC

## 2020-12-10 DIAGNOSIS — C61 Malignant neoplasm of prostate: Secondary | ICD-10-CM | POA: Diagnosis not present

## 2020-12-14 ENCOUNTER — Other Ambulatory Visit: Payer: Self-pay | Admitting: Family Medicine

## 2020-12-16 ENCOUNTER — Telehealth: Payer: Self-pay | Admitting: Family Medicine

## 2020-12-16 NOTE — Telephone Encounter (Signed)
Left message for patient to call back and schedule Medicare Annual Wellness Visit (AWV).   Please offer to do virtually or by telephone.   Due for AWVI  Please schedule at anytime with Nurse Health Advisor.   

## 2020-12-17 DIAGNOSIS — C7802 Secondary malignant neoplasm of left lung: Secondary | ICD-10-CM | POA: Diagnosis not present

## 2020-12-17 DIAGNOSIS — Z5111 Encounter for antineoplastic chemotherapy: Secondary | ICD-10-CM | POA: Diagnosis not present

## 2020-12-17 DIAGNOSIS — C61 Malignant neoplasm of prostate: Secondary | ICD-10-CM | POA: Diagnosis not present

## 2020-12-17 DIAGNOSIS — C7951 Secondary malignant neoplasm of bone: Secondary | ICD-10-CM | POA: Diagnosis not present

## 2020-12-29 DIAGNOSIS — C61 Malignant neoplasm of prostate: Secondary | ICD-10-CM | POA: Diagnosis not present

## 2020-12-29 DIAGNOSIS — C7951 Secondary malignant neoplasm of bone: Secondary | ICD-10-CM | POA: Diagnosis not present

## 2021-01-26 DIAGNOSIS — C61 Malignant neoplasm of prostate: Secondary | ICD-10-CM | POA: Diagnosis not present

## 2021-01-26 DIAGNOSIS — C7951 Secondary malignant neoplasm of bone: Secondary | ICD-10-CM | POA: Diagnosis not present

## 2021-02-24 DIAGNOSIS — C61 Malignant neoplasm of prostate: Secondary | ICD-10-CM | POA: Diagnosis not present

## 2021-02-24 DIAGNOSIS — C7951 Secondary malignant neoplasm of bone: Secondary | ICD-10-CM | POA: Diagnosis not present

## 2021-03-11 ENCOUNTER — Telehealth: Payer: Self-pay | Admitting: *Deleted

## 2021-03-11 NOTE — Chronic Care Management (AMB) (Signed)
Please note that the patient was previously enrolled for care management services but has not had a face to face visit with the primary care provider in the last 12 month period. This patient has been referred to the appropriate practice personnel for assistance with scheduling a PCP appointment.

## 2021-03-18 DIAGNOSIS — C61 Malignant neoplasm of prostate: Secondary | ICD-10-CM | POA: Diagnosis not present

## 2021-03-25 DIAGNOSIS — C7951 Secondary malignant neoplasm of bone: Secondary | ICD-10-CM | POA: Diagnosis not present

## 2021-03-25 DIAGNOSIS — N5201 Erectile dysfunction due to arterial insufficiency: Secondary | ICD-10-CM | POA: Diagnosis not present

## 2021-03-25 DIAGNOSIS — C61 Malignant neoplasm of prostate: Secondary | ICD-10-CM | POA: Diagnosis not present

## 2021-04-22 DIAGNOSIS — C61 Malignant neoplasm of prostate: Secondary | ICD-10-CM | POA: Diagnosis not present

## 2021-04-22 DIAGNOSIS — C7951 Secondary malignant neoplasm of bone: Secondary | ICD-10-CM | POA: Diagnosis not present

## 2021-04-28 ENCOUNTER — Telehealth: Payer: Self-pay | Admitting: Family Medicine

## 2021-04-28 NOTE — Telephone Encounter (Signed)
What is the name of the medication? [name] (NORVASC) 5 MG tablet [[id number]]  Have you contacted your pharmacy to request a refill? Pt is needing a refill on this script.   Which pharmacy would you like this sent to? [facility] STORE [place], [place] - [address] AT [address]  [address], [place] [address]  Phone:  [phone]  Fax:  [phone]  DEA #:  [id number]   Patient notified that their request is being sent to the clinical staff for review and that they should receive a call once it is complete. If they do not receive a call within 72 hours they can check with their pharmacy or our office.

## 2021-04-29 NOTE — Telephone Encounter (Signed)
Lft VM to rtn call to schedule a f/u appt that is over due by 6 months.  Dm/cma

## 2021-04-30 ENCOUNTER — Telehealth: Payer: Self-pay | Admitting: Family Medicine

## 2021-04-30 NOTE — Telephone Encounter (Signed)
Appointment scheduled for follow up on medications.

## 2021-05-03 ENCOUNTER — Ambulatory Visit: Payer: Medicare Other | Admitting: Family Medicine

## 2021-05-05 ENCOUNTER — Other Ambulatory Visit: Payer: Self-pay

## 2021-05-05 ENCOUNTER — Ambulatory Visit (INDEPENDENT_AMBULATORY_CARE_PROVIDER_SITE_OTHER): Payer: Medicare Other | Admitting: Family Medicine

## 2021-05-05 ENCOUNTER — Encounter: Payer: Self-pay | Admitting: Family Medicine

## 2021-05-05 VITALS — BP 140/82 | HR 61 | Temp 97.5°F | Ht 70.0 in | Wt 185.0 lb

## 2021-05-05 DIAGNOSIS — R0989 Other specified symptoms and signs involving the circulatory and respiratory systems: Secondary | ICD-10-CM

## 2021-05-05 DIAGNOSIS — I10 Essential (primary) hypertension: Secondary | ICD-10-CM | POA: Diagnosis not present

## 2021-05-05 DIAGNOSIS — E119 Type 2 diabetes mellitus without complications: Secondary | ICD-10-CM | POA: Diagnosis not present

## 2021-05-05 DIAGNOSIS — I2510 Atherosclerotic heart disease of native coronary artery without angina pectoris: Secondary | ICD-10-CM

## 2021-05-05 DIAGNOSIS — E7800 Pure hypercholesterolemia, unspecified: Secondary | ICD-10-CM

## 2021-05-05 DIAGNOSIS — Z789 Other specified health status: Secondary | ICD-10-CM

## 2021-05-05 DIAGNOSIS — Z23 Encounter for immunization: Secondary | ICD-10-CM | POA: Diagnosis not present

## 2021-05-05 NOTE — Progress Notes (Signed)
Established Patient Office Visit  Subjective:  Patient ID: [name], male    DOB: 1941-02-05  Age: 80 y.o. MRN: [id number]  CC:  Chief Complaint  Patient presents with  . Follow-up    Refill/follow up on medications.     HPI [name] presents for follow-up of hypertension, diabetes, elevated cholesterol with history of carotid artery disease.  Has been lost to follow-up for over a year.  He has been consumed with treatments for prostate cancer.  He is currently taking a Lupron injection and another medication that is unknown to him that he takes twice daily.  Last PSA was close to 0.  He has had no significant problems since his last visit.  He is interested in trying cholestyramine for his chronically loose stools.  Past Medical History:  Diagnosis Date  . 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 Aortic atherosclerosis ([place])   . Biceps muscle tear    right  . CAD (coronary artery disease)    anterior ischemia on a stress perfusion study.  (Catheterization in 2007, demonstrating 95% mid-LAD stenosis.  The circumflex has 20% proximal stenosis, the right coronary artery had 20% mid stenosis.  The EF was 60%.  He had a non drug eluting stent placed.    . Diverticulosis of colon (without mention of hemorrhage)   . Dyspnea    mild with extreme exertion  . Esophageal reflux   . Esophageal stricture   . History of bronchitis   . HLD (hyperlipidemia)   . Hot flashes   . Malignant neoplasm of prostate ([place])   . Osteoarthrosis, unspecified whether generalized or localized, unspecified site   . Osteoarthrosis, unspecified whether generalized or localized, unspecified site   . Other and unspecified hyperlipidemia   . Pulmonary nodule    has resolved  . Recurrent aspiration bronchitis/pneumonia   . Skin cancer   . Spinal stenosis   . Type 2 diabetes mellitus ([place])   . Unspecified essential hypertension     Past Surgical History:  Procedure Laterality Date  . CARDIAC  CATHETERIZATION  2007  . COLONOSCOPY  03/13/2013  . DISTAL BICEPS TENDON REPAIR Right 03/12/2015   Procedure: REPAIR/RECONSTRUCTION RIGHT BICEPS TENDON;  Surgeon: Daryll Brod, MD;  Location: [facility];  Service: Orthopedics;  Laterality: Right;  . ESOPHAGOGASTRODUODENOSCOPY  2011  . eyelid surgery for Lid Lag    . FOOT SURGERY    . HYDROCELE EXCISION / REPAIR    . INGUINAL HERNIA REPAIR     80 yrs old  . MASTECTOMY Left    benign tumor  . Prostate treatment     cancer  . SKIN CANCER EXCISION  2012   skin cancer on right ear  . TOTAL HIP ARTHROPLASTY Left 10/23/2017   Procedure: LEFT TOTAL HIP ARTHROPLASTY ANTERIOR APPROACH;  Surgeon: Gaynelle Arabian, MD;  Location: [facility];  Service: Orthopedics;  Laterality: Left;    Family History  Problem Relation Age of Onset  . Prostate cancer Father   . Prostate cancer Brother   . Colon cancer Neg Hx     Social History   Socioeconomic History  . Marital status: Married    Spouse name: Not on file  . Number of children: 1  . Years of education: Not on file  . Highest education level: Not on file  Occupational History  . Occupation: Psychologist, occupational  Tobacco Use  . Smoking status: Former    Packs/day: 1.00  Years: 18.00    Pack years: 18.00    Types: Cigarettes    Quit date: 04/24/1977    Years since quitting: 44.0  . Smokeless tobacco: Former    Types: Snuff, Chew  Vaping Use  . Vaping Use: Never used  Substance and Sexual Activity  . Alcohol use: Yes    Alcohol/week: 12.0 standard drinks    Types: 12 Cans of beer per week    Comment: 1-2 beers a day  . Drug use: No  . Sexual activity: Not on file  Other Topics Concern  . Not on file  Social History Narrative  . Not on file   Social Determinants of Health   Financial Resource Strain: Not on file  Food Insecurity: Not on file  Transportation Needs: Not on file  Physical Activity: Not on file  Stress: Not on file  Social Connections: Not on file   Intimate Partner Violence: Not on file    Outpatient Medications Prior to Visit  Medication Sig Dispense Refill  . acetaminophen (TYLENOL) 500 MG tablet Take 500 mg by mouth 3 (three) times daily as needed (for pain.).    Marland Kitchen amLODipine (NORVASC) 5 MG tablet TAKE 1 TABLET BY MOUTH DAILY 90 tablet 2  . fluticasone (FLONASE) 50 MCG/ACT nasal spray SHAKE LIQUID AND USE 2 SPRAYS IN EACH NOSTRIL DAILY 16 g 6  . gabapentin (NEURONTIN) 100 MG capsule TAKE 2 CAPSULES(200 MG) BY MOUTH AT BEDTIME 60 capsule 2  . lisinopril (ZESTRIL) 10 MG tablet TAKE 1 TABLET BY MOUTH DAILY 90 tablet 0  . metFORMIN (GLUCOPHAGE-XR) 500 MG 24 hr tablet TAKE 1 TABLET(500 MG) BY MOUTH TWICE DAILY AT 8 AM AND AT 10 PM 180 tablet 1  . Omega-3 Fatty Acids (FISH OIL) 1000 MG CAPS Take by mouth.    Marland Kitchen omeprazole (PRILOSEC) 40 MG capsule TAKE 1 CAPSULE(40 MG) BY MOUTH DAILY 90 capsule 2  . Magnesium 400 MG CAPS Take 400 mg by mouth daily.     . methocarbamol (ROBAXIN) 500 MG tablet Take 1 tablet (500 mg total) by mouth every 6 (six) hours as needed for muscle spasms. 60 tablet 0  . pravastatin (PRAVACHOL) 80 MG tablet Take 1 tablet (80 mg total) by mouth daily. 90 tablet 3  . rosuvastatin (CRESTOR) 20 MG tablet TAKE 1 TABLET(20 MG) BY MOUTH DAILY 90 tablet 0   No facility-administered medications prior to visit.    Allergies  Allergen Reactions  . Simvastatin Other (See Comments)     pt states INTOL to ZOCOR w/ leg pains    ROS Review of Systems  Constitutional: Negative.   HENT: Negative.    Eyes:  Negative for photophobia and visual disturbance.  Respiratory: Negative.    Cardiovascular: Negative.   Gastrointestinal:  Positive for diarrhea. Negative for abdominal pain, anal bleeding, blood in stool, constipation, nausea and rectal pain.  Neurological:  Negative for speech difficulty and weakness.  Psychiatric/Behavioral: Negative.       Objective:    Physical Exam Vitals and nursing note reviewed.   Constitutional:      General: He is not in acute distress.    Appearance: Normal appearance. He is not ill-appearing, toxic-appearing or diaphoretic.  HENT:     Head: Normocephalic and atraumatic.     Right Ear: Tympanic membrane, ear canal and external ear normal.     Left Ear: Tympanic membrane, ear canal and external ear normal.     Mouth/Throat:     Mouth: Mucous membranes are  moist.     Pharynx: Oropharynx is clear. No oropharyngeal exudate or posterior oropharyngeal erythema.  Eyes:     General: No scleral icterus.       Right eye: No discharge.        Left eye: No discharge.     Conjunctiva/sclera: Conjunctivae normal.     Pupils: Pupils are equal, round, and reactive to light.  Cardiovascular:     Rate and Rhythm: Normal rate and regular rhythm.     Pulses:          Carotid pulses are 1+ on the right side and 1+ on the left side. Pulmonary:     Effort: Pulmonary effort is normal.     Breath sounds: Normal breath sounds.  Abdominal:     General: Bowel sounds are normal.  Musculoskeletal:     Cervical back: No rigidity.  Lymphadenopathy:     Cervical: No cervical adenopathy.  Skin:    General: Skin is warm and dry.  Neurological:     Mental Status: He is alert and oriented to person, place, and time.  Psychiatric:        Mood and Affect: Mood normal.        Behavior: Behavior normal.    BP 140/82 (BP Location: Left Arm, Patient Position: Sitting, Cuff Size: Normal)   Pulse 61   Temp (!) 97.5 F (36.4 C) (Temporal)   Ht 5\' 10"  (1.778 m)   Wt 185 lb (83.9 kg)   SpO2 100%   BMI 26.54 kg/m  Wt Readings from Last 3 Encounters:  05/05/21 185 lb (83.9 kg)  01/31/20 187 lb 6.4 oz (85 kg)  03/26/19 186 lb (84.4 kg)     Health Maintenance Due  Topic Date Due  . FOOT EXAM  Never done  . Hepatitis C Screening  Never done  . Zoster Vaccines- Shingrix (1 of 2) Never done  . Pneumonia Vaccine 47+ Years old (2 - PPSV23 or PCV20) 08/13/2014  . HEMOGLOBIN A1C   08/02/2020  . OPHTHALMOLOGY EXAM  09/03/2020    There are no preventive care reminders to display for this patient.  Lab Results  Component Value Date   TSH 0.74 03/21/2018   Lab Results  Component Value Date   WBC 6.4 01/31/2020   HGB 13.3 01/31/2020   HCT 38.6 (L) 01/31/2020   MCV 93.6 01/31/2020   PLT 164.0 01/31/2020   Lab Results  Component Value Date   NA 139 01/31/2020   K 4.3 01/31/2020   CO2 27 01/31/2020   GLUCOSE 155 (H) 01/31/2020   BUN 18 01/31/2020   CREATININE 0.83 01/31/2020   BILITOT 0.6 01/31/2020   ALKPHOS 66 01/31/2020   AST 16 01/31/2020   ALT 18 01/31/2020   PROT 6.3 01/31/2020   ALBUMIN 4.3 01/31/2020   CALCIUM 9.1 01/31/2020   ANIONGAP 10 10/24/2017   GFR 89.51 01/31/2020   Lab Results  Component Value Date   CHOL 198 01/31/2020   Lab Results  Component Value Date   HDL 42.80 01/31/2020   Lab Results  Component Value Date   LDLCALC 116 (H) 01/31/2020   Lab Results  Component Value Date   TRIG 197.0 (H) 01/31/2020   Lab Results  Component Value Date   CHOLHDL 5 01/31/2020   Lab Results  Component Value Date   HGBA1C 7.2 (H) 01/31/2020      Assessment & Plan:   Problem List Items Addressed This Visit       Cardiovascular  and Mediastinum   Essential hypertension - Primary   Relevant Orders   CBC   Comprehensive metabolic panel   Urinalysis, Routine w reflex microscopic   Microalbumin / creatinine urine ratio   Atherosclerosis of native coronary artery of native heart without angina pectoris   Relevant Orders   Comprehensive metabolic panel   Lipid panel   LDL cholesterol, direct     Endocrine   Controlled type 2 diabetes mellitus without complication, without long-term current use of insulin (HCC)   Relevant Orders   Comprehensive metabolic panel   Hemoglobin A1c   Urinalysis, Routine w reflex microscopic   Microalbumin / creatinine urine ratio   Ambulatory referral to Ophthalmology     Other   Elevated  cholesterol   Relevant Orders   Comprehensive metabolic panel   Lipid panel   LDL cholesterol, direct   Alcohol use   Other Visit Diagnoses     Need for influenza vaccination       Relevant Orders   Flu Vaccine QUAD High Dose(Fluad) (Completed)   Decreased carotid pulse       Relevant Orders   VAS US CAROTID       No orders of the defined types were placed in this encounter.   Follow-up: Return in about 2 months (around 07/05/2021), or Bring all of your medicines with you next visit.Libby Maw, MD

## 2021-05-07 ENCOUNTER — Other Ambulatory Visit: Payer: Self-pay

## 2021-05-07 DIAGNOSIS — I10 Essential (primary) hypertension: Secondary | ICD-10-CM

## 2021-05-07 MED ORDER — AMLODIPINE BESYLATE 5 MG PO TABS: 5.0000 mg | ORAL_TABLET | Freq: Every day | ORAL | 2 refills | Status: DC

## 2021-05-07 NOTE — Progress Notes (Signed)
Refill sent to pharmacy on file

## 2021-05-09 ENCOUNTER — Encounter: Payer: Self-pay | Admitting: Family Medicine

## 2021-05-11 ENCOUNTER — Telehealth: Payer: Self-pay | Admitting: Family Medicine

## 2021-05-11 ENCOUNTER — Other Ambulatory Visit (INDEPENDENT_AMBULATORY_CARE_PROVIDER_SITE_OTHER): Payer: Medicare Other

## 2021-05-11 ENCOUNTER — Other Ambulatory Visit: Payer: Self-pay

## 2021-05-11 ENCOUNTER — Telehealth: Payer: Self-pay

## 2021-05-11 DIAGNOSIS — I2510 Atherosclerotic heart disease of native coronary artery without angina pectoris: Secondary | ICD-10-CM | POA: Diagnosis not present

## 2021-05-11 DIAGNOSIS — E1165 Type 2 diabetes mellitus with hyperglycemia: Secondary | ICD-10-CM

## 2021-05-11 DIAGNOSIS — E119 Type 2 diabetes mellitus without complications: Secondary | ICD-10-CM

## 2021-05-11 DIAGNOSIS — I10 Essential (primary) hypertension: Secondary | ICD-10-CM

## 2021-05-11 DIAGNOSIS — E7800 Pure hypercholesterolemia, unspecified: Secondary | ICD-10-CM

## 2021-05-11 LAB — COMPREHENSIVE METABOLIC PANEL
ALT: 17 U/L (ref 0–53)
AST: 14 U/L (ref 0–37)
Albumin: 4.1 g/dL (ref 3.5–5.2)
Alkaline Phosphatase: 68 U/L (ref 39–117)
BUN: 12 mg/dL (ref 6–23)
CO2: 30 mEq/L (ref 19–32)
Calcium: 9.7 mg/dL (ref 8.4–10.5)
Chloride: 102 mEq/L (ref 96–112)
Creatinine, Ser: 0.8 mg/dL (ref 0.40–1.50)
GFR: 84.06 mL/min (ref >60.00)
Glucose, Bld: 201 mg/dL — ABNORMAL HIGH (ref 70–99)
Potassium: 4.8 mEq/L (ref 3.5–5.1)
Sodium: 140 mEq/L (ref 135–145)
Total Bilirubin: 0.7 mg/dL (ref 0.2–1.2)
Total Protein: 6.2 g/dL (ref 6.0–8.3)

## 2021-05-11 LAB — CBC
HCT: 40.1 % (ref 39.0–52.0)
Hemoglobin: 13.6 g/dL (ref 13.0–17.0)
MCHC: 33.9 g/dL (ref 30.0–36.0)
MCV: 92.8 fl (ref 78.0–100.0)
Platelets: 170 10*3/uL (ref 150.0–400.0)
RBC: 4.32 Mil/uL (ref 4.22–5.81)
RDW: 13.4 % (ref 11.5–15.5)
WBC: 5.6 10*3/uL (ref 4.0–10.5)

## 2021-05-11 LAB — URINALYSIS, ROUTINE W REFLEX MICROSCOPIC
Bilirubin Urine: NEGATIVE
Hgb urine dipstick: NEGATIVE
Ketones, ur: NEGATIVE
Leukocytes,Ua: NEGATIVE
Nitrite: NEGATIVE
RBC / HPF: NONE SEEN (ref 0–?)
Specific Gravity, Urine: 1.015 (ref 1.000–1.030)
Total Protein, Urine: NEGATIVE
Urine Glucose: NEGATIVE
Urobilinogen, UA: 0.2 (ref 0.0–1.0)
WBC, UA: NONE SEEN (ref 0–?)
pH: 6 (ref 5.0–8.0)

## 2021-05-11 LAB — MICROALBUMIN / CREATININE URINE RATIO
Creatinine,U: 106.6 mg/dL
Microalb Creat Ratio: 0.7 mg/g (ref 0.0–30.0)
Microalb, Ur: <0.7 mg/dL (ref 0.0–1.9)

## 2021-05-11 LAB — LIPID PANEL
Cholesterol: 117 mg/dL (ref 0–200)
HDL: 36.8 mg/dL — ABNORMAL LOW (ref >39.00)
LDL Cholesterol: 46 mg/dL (ref 0–99)
NonHDL: 80.44
Total CHOL/HDL Ratio: 3
Triglycerides: 171 mg/dL — ABNORMAL HIGH (ref 0.0–149.0)
VLDL: 34.2 mg/dL (ref 0.0–40.0)

## 2021-05-11 LAB — LDL CHOLESTEROL, DIRECT: Direct LDL: 59 mg/dL

## 2021-05-11 LAB — HEMOGLOBIN A1C: Hgb A1c MFr Bld: 9.3 % — ABNORMAL HIGH (ref 4.6–6.5)

## 2021-05-11 MED ORDER — METFORMIN HCL ER 500 MG PO TB24: 1000.0000 mg | ORAL_TABLET | Freq: Two times a day (BID) | ORAL | 1 refills | Status: DC

## 2021-05-11 NOTE — Progress Notes (Signed)
I have attempted multiple times to reach patient and  left a voice message for patient to return my call ,to see if he would like to reschedule his initial visit with the clinical pharmacist that was cancel earlier this year.  [place] Pharmacist Assistant [phone]

## 2021-05-11 NOTE — Addendum Note (Signed)
Addended by: Jon Billings on: 05/11/2021 05:09 PM   Modules accepted: Orders

## 2021-05-11 NOTE — Addendum Note (Signed)
Addended by: Jon Billings on: 05/11/2021 05:11 PM   Modules accepted: Orders

## 2021-05-11 NOTE — Addendum Note (Signed)
Addended by: Jon Billings on: 05/11/2021 05:13 PM   Modules accepted: Orders

## 2021-05-11 NOTE — Telephone Encounter (Signed)
Left message for patient to call back and schedule Medicare Annual Wellness Visit (AWV) in office.  ° °If not able to come in office, please offer to do virtually or by telephone.  Left office number and my jabber #[phone]. ° °Due for AWVI ° °Please schedule at anytime with Nurse Health Advisor. °  °

## 2021-05-24 ENCOUNTER — Other Ambulatory Visit: Payer: Self-pay

## 2021-05-24 ENCOUNTER — Ambulatory Visit (HOSPITAL_BASED_OUTPATIENT_CLINIC_OR_DEPARTMENT_OTHER)
Admission: RE | Admit: 2021-05-24 | Discharge: 2021-05-24 | Disposition: A | Discharge status: Home / Self Care | Payer: Medicare Other | Source: Ambulatory Visit | Attending: Family Medicine | Admitting: Family Medicine

## 2021-05-24 DIAGNOSIS — R0989 Other specified symptoms and signs involving the circulatory and respiratory systems: Secondary | ICD-10-CM | POA: Insufficient documentation

## 2021-05-28 ENCOUNTER — Other Ambulatory Visit: Payer: Self-pay

## 2021-05-28 DIAGNOSIS — M545 Low back pain, unspecified: Secondary | ICD-10-CM

## 2021-05-28 DIAGNOSIS — E7800 Pure hypercholesterolemia, unspecified: Secondary | ICD-10-CM

## 2021-05-28 DIAGNOSIS — I2510 Atherosclerotic heart disease of native coronary artery without angina pectoris: Secondary | ICD-10-CM

## 2021-05-28 MED ORDER — ROSUVASTATIN CALCIUM 20 MG PO TABS: ORAL_TABLET | ORAL | 4 refills | Status: DC

## 2021-05-28 MED ORDER — GABAPENTIN 100 MG PO CAPS: ORAL_CAPSULE | ORAL | 2 refills | Status: DC

## 2021-05-28 NOTE — Telephone Encounter (Signed)
Refill request for pending Rx, went over U/S results with patient who state that he needs refill on statin and would like a refill on gabapentin. Please advise.

## 2021-06-17 ENCOUNTER — Telehealth: Payer: Self-pay | Admitting: Family Medicine

## 2021-06-17 DIAGNOSIS — I700 Atherosclerosis of aorta: Secondary | ICD-10-CM | POA: Diagnosis not present

## 2021-06-17 DIAGNOSIS — R109 Unspecified abdominal pain: Secondary | ICD-10-CM | POA: Diagnosis not present

## 2021-06-17 DIAGNOSIS — Z87891 Personal history of nicotine dependence: Secondary | ICD-10-CM | POA: Diagnosis not present

## 2021-06-17 DIAGNOSIS — K5792 Diverticulitis of intestine, part unspecified, without perforation or abscess without bleeding: Secondary | ICD-10-CM | POA: Diagnosis not present

## 2021-06-17 DIAGNOSIS — K8020 Calculus of gallbladder without cholecystitis without obstruction: Secondary | ICD-10-CM | POA: Diagnosis not present

## 2021-06-17 NOTE — Telephone Encounter (Signed)
Will call to check on patient later on today.

## 2021-06-17 NOTE — Telephone Encounter (Signed)
Called to check on patient per patient he did go to ER was diagnosed with gallstones and diverticulitis given pain meds feeling a little better. Patient agrees to keep upcoming appointment in Dec. No questions as of now.

## 2021-06-23 DIAGNOSIS — C7951 Secondary malignant neoplasm of bone: Secondary | ICD-10-CM | POA: Diagnosis not present

## 2021-06-23 DIAGNOSIS — C61 Malignant neoplasm of prostate: Secondary | ICD-10-CM | POA: Diagnosis not present

## 2021-06-24 DIAGNOSIS — K8020 Calculus of gallbladder without cholecystitis without obstruction: Secondary | ICD-10-CM | POA: Diagnosis not present

## 2021-06-24 DIAGNOSIS — K5792 Diverticulitis of intestine, part unspecified, without perforation or abscess without bleeding: Secondary | ICD-10-CM | POA: Diagnosis not present

## 2021-06-25 ENCOUNTER — Other Ambulatory Visit (HOSPITAL_COMMUNITY): Payer: Self-pay | Admitting: Urology

## 2021-06-25 ENCOUNTER — Other Ambulatory Visit: Payer: Self-pay | Admitting: Urology

## 2021-06-25 DIAGNOSIS — C61 Malignant neoplasm of prostate: Secondary | ICD-10-CM | POA: Diagnosis not present

## 2021-06-28 DIAGNOSIS — K5730 Diverticulosis of large intestine without perforation or abscess without bleeding: Secondary | ICD-10-CM | POA: Diagnosis not present

## 2021-06-28 DIAGNOSIS — C61 Malignant neoplasm of prostate: Secondary | ICD-10-CM | POA: Diagnosis not present

## 2021-06-28 DIAGNOSIS — K8020 Calculus of gallbladder without cholecystitis without obstruction: Secondary | ICD-10-CM | POA: Diagnosis not present

## 2021-06-28 DIAGNOSIS — K5792 Diverticulitis of intestine, part unspecified, without perforation or abscess without bleeding: Secondary | ICD-10-CM | POA: Diagnosis not present

## 2021-06-30 ENCOUNTER — Other Ambulatory Visit: Payer: Self-pay

## 2021-06-30 ENCOUNTER — Ambulatory Visit: Payer: Medicare Other | Admitting: Family Medicine

## 2021-06-30 ENCOUNTER — Ambulatory Visit (HOSPITAL_COMMUNITY)
Admission: RE | Admit: 2021-06-30 | Discharge: 2021-06-30 | Disposition: A | Discharge status: Home / Self Care | Payer: Medicare Other | Source: Ambulatory Visit | Attending: Urology | Admitting: Urology

## 2021-06-30 ENCOUNTER — Encounter (HOSPITAL_COMMUNITY)
Admission: RE | Admit: 2021-06-30 | Discharge: 2021-06-30 | Disposition: A | Discharge status: Home / Self Care | Payer: Medicare Other | Source: Ambulatory Visit | Attending: Urology | Admitting: Urology

## 2021-06-30 DIAGNOSIS — C61 Malignant neoplasm of prostate: Secondary | ICD-10-CM | POA: Insufficient documentation

## 2021-06-30 DIAGNOSIS — M19011 Primary osteoarthritis, right shoulder: Secondary | ICD-10-CM | POA: Diagnosis not present

## 2021-06-30 DIAGNOSIS — M19031 Primary osteoarthritis, right wrist: Secondary | ICD-10-CM | POA: Diagnosis not present

## 2021-06-30 DIAGNOSIS — M542 Cervicalgia: Secondary | ICD-10-CM | POA: Diagnosis not present

## 2021-06-30 MED ORDER — TECHNETIUM TC 99M MEDRONATE IV KIT
21.8000 | PACK | Freq: Once | INTRAVENOUS | Status: AC
  Administered 2021-06-30: 10:00:00 21.8 via INTRAVENOUS

## 2021-07-01 DIAGNOSIS — C7951 Secondary malignant neoplasm of bone: Secondary | ICD-10-CM | POA: Diagnosis not present

## 2021-07-01 DIAGNOSIS — C61 Malignant neoplasm of prostate: Secondary | ICD-10-CM | POA: Diagnosis not present

## 2021-07-01 DIAGNOSIS — C7802 Secondary malignant neoplasm of left lung: Secondary | ICD-10-CM | POA: Diagnosis not present

## 2021-07-07 ENCOUNTER — Telehealth: Payer: Self-pay

## 2021-07-07 ENCOUNTER — Ambulatory Visit: Payer: Medicare Other | Admitting: Family Medicine

## 2021-07-07 NOTE — Progress Notes (Signed)
° ° °  Chronic Care Management Pharmacy Assistant   Name: [name]  MRN: [id number] DOB: [birth year]  I have attempted multiple times to reach patient and  left a voice message for patient to return my call ,to see if he would like to reschedule his initial visit with the clinical pharmacist that was cancel earlier this year..The care management team is available to follow up with the patient after provider conversation with the patient regarding recommendation for care management engagement.  [place] Pharmacist Assistant [phone]

## 2021-07-13 ENCOUNTER — Encounter: Payer: Self-pay | Admitting: Family Medicine

## 2021-07-13 ENCOUNTER — Other Ambulatory Visit: Payer: Self-pay

## 2021-07-13 ENCOUNTER — Ambulatory Visit (INDEPENDENT_AMBULATORY_CARE_PROVIDER_SITE_OTHER): Payer: Medicare Other | Admitting: Family Medicine

## 2021-07-13 VITALS — BP 122/70 | HR 75 | Temp 97.3°F | Ht 70.0 in | Wt 180.2 lb

## 2021-07-13 DIAGNOSIS — I2510 Atherosclerotic heart disease of native coronary artery without angina pectoris: Secondary | ICD-10-CM

## 2021-07-13 DIAGNOSIS — I10 Essential (primary) hypertension: Secondary | ICD-10-CM

## 2021-07-13 DIAGNOSIS — T887XXA Unspecified adverse effect of drug or medicament, initial encounter: Secondary | ICD-10-CM | POA: Diagnosis not present

## 2021-07-13 DIAGNOSIS — E1165 Type 2 diabetes mellitus with hyperglycemia: Secondary | ICD-10-CM

## 2021-07-13 DIAGNOSIS — C61 Malignant neoplasm of prostate: Secondary | ICD-10-CM | POA: Diagnosis not present

## 2021-07-13 DIAGNOSIS — E7800 Pure hypercholesterolemia, unspecified: Secondary | ICD-10-CM

## 2021-07-13 MED ORDER — RYBELSUS 3 MG PO TABS: 3.0000 mg | ORAL_TABLET | Freq: Every day | ORAL | 2 refills | Status: DC

## 2021-07-13 NOTE — Progress Notes (Signed)
Established Patient Office Visit  Subjective:  Patient ID: [name], male    DOB: 06/18/1941  Age: 80 y.o. MRN: [id number]  CC:  Chief Complaint  Patient presents with   Follow-up    2 month follow up, concerns about no sense of taste every thing has a metallic taste.      HPI [name] presents for follow-up of type 2 diabetes status post increasing her metformin to 1000 mg twice daily at his last visit.  I found out this afternoon he was only been able to tolerate the 500 mg twice daily.  Last hemoglobin A1c was 9.3.  Blood pressure and lipids are well controlled on his current regimen.  He does have a history of ASCVD.  He is also experiencing a metallic taste in his mouth and wonders if one of his medications could be causing this.  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  (Catheterization in 2007, demonstrating 95% mid-LAD stenosis.  The circumflex has 20% proximal stenosis, the right coronary artery had 20% mid stenosis.  The EF was 60%.  He had a non drug eluting stent placed.     Diverticulosis of colon (without mention of hemorrhage)    Dyspnea    mild with extreme exertion   Esophageal reflux    Esophageal stricture    History of bronchitis    HLD (hyperlipidemia)    Hot flashes    Malignant neoplasm of prostate (HCC)    Osteoarthrosis, unspecified whether generalized or localized, unspecified site    Osteoarthrosis, unspecified whether generalized or localized, unspecified site    Other and unspecified hyperlipidemia    Pulmonary nodule    has resolved   Recurrent aspiration bronchitis/pneumonia    Skin cancer    Spinal stenosis    Type 2 diabetes mellitus ([place])    Unspecified essential hypertension     Past Surgical History:  Procedure Laterality Date   CARDIAC  CATHETERIZATION  2007   COLONOSCOPY  03/13/2013   DISTAL BICEPS TENDON REPAIR Right 03/12/2015   Procedure: REPAIR/RECONSTRUCTION RIGHT BICEPS TENDON;  Surgeon: Daryll Brod, MD;  Location: [facility];  Service: Orthopedics;  Laterality: Right;   ESOPHAGOGASTRODUODENOSCOPY  2011   eyelid surgery for Lid Lag     FOOT SURGERY     HYDROCELE EXCISION / REPAIR     INGUINAL HERNIA REPAIR     80 yrs old   MASTECTOMY Left    benign tumor   Prostate treatment     cancer   SKIN CANCER EXCISION  2012   skin cancer on right ear   TOTAL HIP ARTHROPLASTY Left 10/23/2017   Procedure: LEFT TOTAL HIP ARTHROPLASTY ANTERIOR APPROACH;  Surgeon: Gaynelle Arabian, MD;  Location: [facility];  Service: Orthopedics;  Laterality: Left;    Family History  Problem Relation Age of Onset   Prostate cancer Father    Prostate cancer Brother    Colon cancer Neg Hx     Social History   Socioeconomic History   Marital status: Married    Spouse name: Not on file   Number of children: 1   Years of education: Not on file   Highest education level: Not on file  Occupational History   Occupation: Psychologist, occupational  Tobacco Use   Smoking status: Former    Packs/day: 1.00    Years: 18.00    Pack years: 18.00    Types: Cigarettes    Quit date: 04/24/1977    Years since quitting: 44.2   Smokeless tobacco: Former    Types: Snuff, Chew  Vaping Use   Vaping Use: Never used  Substance and Sexual Activity   Alcohol use: Yes    Alcohol/week: 12.0 standard drinks    Types: 12 Cans of beer per week    Comment: 1-2 beers a day   Drug use: No   Sexual activity: Not on file  Other Topics Concern   Not on file  Social History Narrative   Not on file   Social Determinants of Health   Financial Resource Strain: Not on file  Food Insecurity: Not on file  Transportation Needs: Not on file  Physical Activity: Not on file  Stress: Not on file  Social Connections: Not on file  Intimate Partner Violence: Not  on file    Outpatient Medications Prior to Visit  Medication Sig Dispense Refill   acetaminophen (TYLENOL) 500 MG tablet Take 500 mg by mouth 3 (three) times daily as needed (for pain.).     amLODipine (NORVASC) 5 MG tablet Take 1 tablet (5 mg total) by mouth daily. 90 tablet 2   b complex vitamins capsule Take 1 capsule by mouth daily.     calcium carbonate (OSCAL) 1500 (600 Ca) MG TABS tablet Take 600 mg of elemental calcium by mouth 2 (two) times daily with a meal.     fluticasone (FLONASE) 50 MCG/ACT nasal spray SHAKE LIQUID AND USE 2 SPRAYS IN EACH NOSTRIL DAILY 16 g 6   gabapentin (NEURONTIN) 100 MG capsule TAKE 2 CAPSULES(200 MG) BY MOUTH AT BEDTIME 60 capsule 2   metFORMIN (GLUCOPHAGE XR) 500 MG 24 hr tablet Take 2 tablets (1,000 mg total) by mouth 2 (two) times daily before a meal. 360 tablet 1   omeprazole (PRILOSEC) 40 MG capsule TAKE 1 CAPSULE(40 MG) BY MOUTH DAILY 90 capsule 2   rosuvastatin (CRESTOR) 20 MG tablet TAKE 1 TABLET(20 MG) BY MOUTH DAILY 90 tablet 4   XTANDI 80 MG tablet Take by mouth.     Omega-3 Fatty Acids (FISH OIL) 1000 MG CAPS Take by mouth. (Patient not taking: Reported on 07/13/2021)     No facility-administered medications prior to visit.    Allergies  Allergen Reactions   Simvastatin Other (See Comments)     pt states INTOL to ZOCOR w/ leg pains    ROS Review of Systems  Constitutional:  Negative for diaphoresis, fatigue, fever and unexpected weight change.  HENT: Negative.    Eyes:  Negative for photophobia and visual disturbance.  Respiratory: Negative.    Cardiovascular: Negative.   Gastrointestinal:  Positive for abdominal pain and diarrhea (loose). Negative for anal bleeding and blood in stool.  Musculoskeletal:  Negative for gait problem and joint swelling.  Neurological:  Negative for speech difficulty, weakness and light-headedness.  Hematological:  Does not bruise/bleed easily.  Psychiatric/Behavioral: Negative.       Objective:     Physical Exam Vitals and nursing note reviewed.  Constitutional:      General: He is not in acute distress.    Appearance: Normal appearance. He is not ill-appearing, toxic-appearing or diaphoretic.  HENT:     Head: Normocephalic and atraumatic.     Right Ear: External ear normal.     Left Ear: External  ear normal.     Mouth/Throat:     Mouth: Mucous membranes are moist.     Pharynx: Oropharynx is clear. No oropharyngeal exudate or posterior oropharyngeal erythema.  Eyes:     General: No scleral icterus.       Right eye: No discharge.        Left eye: No discharge.     Extraocular Movements: Extraocular movements intact.     Conjunctiva/sclera: Conjunctivae normal.     Pupils: Pupils are equal, round, and reactive to light.  Cardiovascular:     Rate and Rhythm: Normal rate and regular rhythm.  Pulmonary:     Effort: Pulmonary effort is normal.     Breath sounds: Normal breath sounds.  Abdominal:     General: Bowel sounds are normal. There is no distension.     Tenderness: There is abdominal tenderness (Weak positive McMurray's sign.). There is no guarding or rebound.  Musculoskeletal:     Cervical back: No rigidity or tenderness.     Right lower leg: No edema.     Left lower leg: No edema.  Lymphadenopathy:     Cervical: No cervical adenopathy.  Neurological:     Mental Status: He is alert and oriented to person, place, and time.  Psychiatric:        Mood and Affect: Mood normal.        Behavior: Behavior normal.    BP 122/70 (BP Location: Left Arm, Patient Position: Sitting, Cuff Size: Large)    Pulse 75    Temp (!) 97.3 F (36.3 C) (Temporal)    Ht 5\' 10"  (1.778 m)    Wt 180 lb 3.2 oz (81.7 kg)    SpO2 97%    BMI 25.86 kg/m  Wt Readings from Last 3 Encounters:  07/13/21 180 lb 3.2 oz (81.7 kg)  05/05/21 185 lb (83.9 kg)  01/31/20 187 lb 6.4 oz (85 kg)       Health Maintenance Due  Topic Date Due   FOOT EXAM  Never done   Hepatitis C Screening  Never done    Zoster Vaccines- Shingrix (1 of 2) Never done   Pneumonia Vaccine 35+ Years old (2 - PPSV23 if available, else PCV20) 08/13/2014    There are no preventive care reminders to display for this patient.  Lab Results  Component Value Date   TSH 0.74 03/21/2018   Lab Results  Component Value Date   WBC 5.6 05/11/2021   HGB 13.6 05/11/2021   HCT 40.1 05/11/2021   MCV 92.8 05/11/2021   PLT 170.0 05/11/2021   Lab Results  Component Value Date   NA 140 05/11/2021   K 4.8 05/11/2021   CO2 30 05/11/2021   GLUCOSE 201 (H) 05/11/2021   BUN 12 05/11/2021   CREATININE 0.80 05/11/2021   BILITOT 0.7 05/11/2021   ALKPHOS 68 05/11/2021   AST 14 05/11/2021   ALT 17 05/11/2021   PROT 6.2 05/11/2021   ALBUMIN 4.1 05/11/2021   CALCIUM 9.7 05/11/2021   ANIONGAP 10 10/24/2017   GFR 84.06 05/11/2021   Lab Results  Component Value Date   CHOL 117 05/11/2021   Lab Results  Component Value Date   HDL 36.80 (L) 05/11/2021   Lab Results  Component Value Date   LDLCALC 46 05/11/2021   Lab Results  Component Value Date   TRIG 171.0 (H) 05/11/2021   Lab Results  Component Value Date   CHOLHDL 3 05/11/2021   Lab Results  Component Value  Date   HGBA1C 9.3 (H) 05/11/2021      Assessment & Plan:   Problem List Items Addressed This Visit       Cardiovascular and Mediastinum   Essential hypertension   Atherosclerosis of native coronary artery of native heart without angina pectoris - Primary     Endocrine   Uncontrolled type 2 diabetes mellitus with hyperglycemia (HCC)   Relevant Medications   Semaglutide (RYBELSUS) 3 MG TABS     Genitourinary   PROSTATE CANCER   Relevant Medications   XTANDI 80 MG tablet     Other   Elevated cholesterol   Medication side effect    Meds ordered this encounter  Medications   Semaglutide (RYBELSUS) 3 MG TABS    Sig: Take 3 mg by mouth daily.    Dispense:  30 tablet    Refill:  2    Follow-up: Return in about 3 months (around  10/11/2021).  Chart review shows that up-to-date per percent of people taking Xtandi may develop taste alteration.  He will discuss this with his urologist.  Faythe Ghee to his colonoscopy for the fourth but I did not realize he could involve a lap cholecystectomy.  We will start Rybelsus today.  I will discuss with his surgeon.  Libby Maw, MD

## 2021-07-21 DIAGNOSIS — I781 Nevus, non-neoplastic: Secondary | ICD-10-CM | POA: Diagnosis not present

## 2021-07-21 DIAGNOSIS — K5792 Diverticulitis of intestine, part unspecified, without perforation or abscess without bleeding: Secondary | ICD-10-CM | POA: Diagnosis not present

## 2021-07-21 DIAGNOSIS — Z923 Personal history of irradiation: Secondary | ICD-10-CM | POA: Diagnosis not present

## 2021-07-21 DIAGNOSIS — E119 Type 2 diabetes mellitus without complications: Secondary | ICD-10-CM | POA: Diagnosis not present

## 2021-07-21 DIAGNOSIS — K648 Other hemorrhoids: Secondary | ICD-10-CM | POA: Diagnosis not present

## 2021-07-21 DIAGNOSIS — I2510 Atherosclerotic heart disease of native coronary artery without angina pectoris: Secondary | ICD-10-CM | POA: Diagnosis not present

## 2021-07-21 DIAGNOSIS — K8020 Calculus of gallbladder without cholecystitis without obstruction: Secondary | ICD-10-CM | POA: Diagnosis not present

## 2021-07-21 DIAGNOSIS — K644 Residual hemorrhoidal skin tags: Secondary | ICD-10-CM | POA: Diagnosis not present

## 2021-07-21 DIAGNOSIS — I10 Essential (primary) hypertension: Secondary | ICD-10-CM | POA: Diagnosis not present

## 2021-07-21 DIAGNOSIS — Z8719 Personal history of other diseases of the digestive system: Secondary | ICD-10-CM | POA: Diagnosis not present

## 2021-07-21 DIAGNOSIS — I6529 Occlusion and stenosis of unspecified carotid artery: Secondary | ICD-10-CM | POA: Diagnosis not present

## 2021-07-21 DIAGNOSIS — E785 Hyperlipidemia, unspecified: Secondary | ICD-10-CM | POA: Diagnosis not present

## 2021-07-21 DIAGNOSIS — K5732 Diverticulitis of large intestine without perforation or abscess without bleeding: Secondary | ICD-10-CM | POA: Diagnosis not present

## 2021-07-21 DIAGNOSIS — Z8546 Personal history of malignant neoplasm of prostate: Secondary | ICD-10-CM | POA: Diagnosis not present

## 2021-07-21 DIAGNOSIS — Z955 Presence of coronary angioplasty implant and graft: Secondary | ICD-10-CM | POA: Diagnosis not present

## 2021-07-27 DIAGNOSIS — K5792 Diverticulitis of intestine, part unspecified, without perforation or abscess without bleeding: Secondary | ICD-10-CM | POA: Diagnosis not present

## 2021-07-27 DIAGNOSIS — K8020 Calculus of gallbladder without cholecystitis without obstruction: Secondary | ICD-10-CM | POA: Diagnosis not present

## 2021-08-02 ENCOUNTER — Telehealth: Payer: Self-pay | Admitting: Family Medicine

## 2021-08-02 NOTE — Telephone Encounter (Signed)
Left message for patient to call back and schedule Medicare Annual Wellness Visit (AWV) in office.  ° °If not able to come in office, please offer to do virtually or by telephone.  Left office number and my jabber #[phone]. ° °Due for AWVI ° °Please schedule at anytime with Nurse Health Advisor. °  °

## 2021-08-03 DIAGNOSIS — H5203 Hypermetropia, bilateral: Secondary | ICD-10-CM | POA: Diagnosis not present

## 2021-08-09 DIAGNOSIS — N5201 Erectile dysfunction due to arterial insufficiency: Secondary | ICD-10-CM | POA: Diagnosis not present

## 2021-08-10 ENCOUNTER — Ambulatory Visit (INDEPENDENT_AMBULATORY_CARE_PROVIDER_SITE_OTHER): Payer: Medicare Other

## 2021-08-10 DIAGNOSIS — Z0000 Encounter for general adult medical examination without abnormal findings: Secondary | ICD-10-CM | POA: Diagnosis not present

## 2021-08-10 NOTE — Patient Instructions (Signed)
[name] , Thank you for taking time to come for your Medicare Wellness Visit. I appreciate your ongoing commitment to your health goals. Please review the following plan we discussed and let me know if I can assist you in the future.   Screening recommendations/referrals: Colonoscopy: no longer required  Recommended yearly ophthalmology/optometry visit for glaucoma screening and checkup Recommended yearly dental visit for hygiene and checkup  Vaccinations: Influenza vaccine: completed  Pneumococcal vaccine: completed  Tdap vaccine: 02/10/2014 Shingles vaccine: will obtain     Advanced directives: yes   Conditions/risks identified: none   Next appointment: none   Preventive Care 80 Years and Older, Male Preventive care refers to lifestyle choices and visits with your health care provider that can promote health and wellness. What does preventive care include? A yearly physical exam. This is also called an annual well check. Dental exams once or twice a year. Routine eye exams. Ask your health care provider how often you should have your eyes checked. Personal lifestyle choices, including: Daily care of your teeth and gums. Regular physical activity. Eating a healthy diet. Avoiding tobacco and drug use. Limiting alcohol use. Practicing safe sex. Taking low doses of aspirin every day. Taking vitamin and mineral supplements as recommended by your health care provider. What happens during an annual well check? The services and screenings done by your health care provider during your annual well check will depend on your age, overall health, lifestyle risk factors, and family history of disease. Counseling  Your health care provider may ask you questions about your: Alcohol use. Tobacco use. Drug use. Emotional well-being. Home and relationship well-being. Sexual activity. Eating habits. History of falls. Memory and ability to understand (cognition). Work and work  Statistician. Screening  You may have the following tests or measurements: Height, weight, and BMI. Blood pressure. Lipid and cholesterol levels. These may be checked every 5 years, or more frequently if you are over 80 years old. Skin check. Lung cancer screening. You may have this screening every year starting at age 80 if you have a 30-pack-year history of smoking and currently smoke or have quit within the past 15 years. Fecal occult blood test (FOBT) of the stool. You may have this test every year starting at age 80. Flexible sigmoidoscopy or colonoscopy. You may have a sigmoidoscopy every 5 years or a colonoscopy every 10 years starting at age 80. Prostate cancer screening. Recommendations will vary depending on your family history and other risks. Hepatitis C blood test. Hepatitis B blood test. Sexually transmitted disease (STD) testing. Diabetes screening. This is done by checking your blood sugar (glucose) after you have not eaten for a while (fasting). You may have this done every 1-3 years. Abdominal aortic aneurysm (AAA) screening. You may need this if you are a current or former smoker. Osteoporosis. You may be screened starting at age 80 if you are at high risk. Talk with your health care provider about your test results, treatment options, and if necessary, the need for more tests. Vaccines  Your health care provider may recommend certain vaccines, such as: Influenza vaccine. This is recommended every year. Tetanus, diphtheria, and acellular pertussis (Tdap, Td) vaccine. You may need a Td booster every 10 years. Zoster vaccine. You may need this after age 14. Pneumococcal 13-valent conjugate (PCV13) vaccine. One dose is recommended after age 45. Pneumococcal polysaccharide (PPSV23) vaccine. One dose is recommended after age 41. Talk to your health care provider about which screenings and vaccines you need and  how often you need them. This information is not intended to replace  advice given to you by your health care provider. Make sure you discuss any questions you have with your health care provider. Document Released: 07/31/2015 Document Revised: 03/23/2016 Document Reviewed: 05/05/2015 Elsevier Interactive Patient Education  2017 [place] Prevention in the Home Falls can cause injuries. They can happen to people of all ages. There are many things you can do to make your home safe and to help prevent falls. What can I do on the outside of my home? Regularly fix the edges of walkways and driveways and fix any cracks. Remove anything that might make you trip as you walk through a door, such as a raised step or threshold. Trim any bushes or trees on the path to your home. Use bright outdoor lighting. Clear any walking paths of anything that might make someone trip, such as rocks or tools. Regularly check to see if handrails are loose or broken. Make sure that both sides of any steps have handrails. Any raised decks and porches should have guardrails on the edges. Have any leaves, snow, or ice cleared regularly. Use sand or salt on walking paths during winter. Clean up any spills in your garage right away. This includes oil or grease spills. What can I do in the bathroom? Use night lights. Install grab bars by the toilet and in the tub and shower. Do not use towel bars as grab bars. Use non-skid mats or decals in the tub or shower. If you need to sit down in the shower, use a plastic, non-slip stool. Keep the floor dry. Clean up any water that spills on the floor as soon as it happens. Remove soap buildup in the tub or shower regularly. Attach bath mats securely with double-sided non-slip rug tape. Do not have throw rugs and other things on the floor that can make you trip. What can I do in the bedroom? Use night lights. Make sure that you have a light by your bed that is easy to reach. Do not use any sheets or blankets that are too big for your bed.  They should not hang down onto the floor. Have a firm chair that has side arms. You can use this for support while you get dressed. Do not have throw rugs and other things on the floor that can make you trip. What can I do in the kitchen? Clean up any spills right away. Avoid walking on wet floors. Keep items that you use a lot in easy-to-reach places. If you need to reach something above you, use a strong step stool that has a grab bar. Keep electrical cords out of the way. Do not use floor polish or wax that makes floors slippery. If you must use wax, use non-skid floor wax. Do not have throw rugs and other things on the floor that can make you trip. What can I do with my stairs? Do not leave any items on the stairs. Make sure that there are handrails on both sides of the stairs and use them. Fix handrails that are broken or loose. Make sure that handrails are as long as the stairways. Check any carpeting to make sure that it is firmly attached to the stairs. Fix any carpet that is loose or worn. Avoid having throw rugs at the top or bottom of the stairs. If you do have throw rugs, attach them to the floor with carpet tape. Make sure that you have  a light switch at the top of the stairs and the bottom of the stairs. If you do not have them, ask someone to add them for you. What else can I do to help prevent falls? Wear shoes that: Do not have high heels. Have rubber bottoms. Are comfortable and fit you well. Are closed at the toe. Do not wear sandals. If you use a stepladder: Make sure that it is fully opened. Do not climb a closed stepladder. Make sure that both sides of the stepladder are locked into place. Ask someone to hold it for you, if possible. Clearly mark and make sure that you can see: Any grab bars or handrails. First and last steps. Where the edge of each step is. Use tools that help you move around (mobility aids) if they are needed. These  include: Canes. Walkers. Scooters. Crutches. Turn on the lights when you go into a dark area. Replace any light bulbs as soon as they burn out. Set up your furniture so you have a clear path. Avoid moving your furniture around. If any of your floors are uneven, fix them. If there are any pets around you, be aware of where they are. Review your medicines with your doctor. Some medicines can make you feel dizzy. This can increase your chance of falling. Ask your doctor what other things that you can do to help prevent falls. This information is not intended to replace advice given to you by your health care provider. Make sure you discuss any questions you have with your health care provider. Document Released: 04/30/2009 Document Revised: 12/10/2015 Document Reviewed: 08/08/2014 Elsevier Interactive Patient Education  2017 [employer].

## 2021-08-10 NOTE — Progress Notes (Signed)
Subjective:   [name] is a 81 y.o. male who presents for an Subsequent Medicare Annual Wellness Visit.  I connected with Merrilyn Puma  today by telephone and verified that I am speaking with the correct person using two identifiers. Location patient: home Location provider: work Persons participating in the virtual visit: patient, provider.   I discussed the limitations, risks, security and privacy concerns of performing an evaluation and management service by telephone and the availability of in person appointments. I also discussed with the patient that there may be a patient responsible charge related to this service. The patient expressed understanding and verbally consented to this telephonic visit.    Interactive audio and video telecommunications were attempted between this provider and patient, however failed, due to patient having technical difficulties OR patient did not have access to video capability.  We continued and completed visit with audio only.    Review of Systems     Cardiac Risk Factors include: advanced age (>59men, >99 women);diabetes mellitus;dyslipidemia;male gender;hypertension     Objective:    Today's Vitals   There is no height or weight on file to calculate BMI.  Advanced Directives 08/10/2021 10/23/2017 10/19/2017 03/12/2015 03/10/2015 06/14/2014 03/06/2014  Does Patient Have a Medical Advance Directive? Yes Yes No;Yes Yes Yes Yes Yes  Type of Paramedic of [place];Living will [place];Living will [place];Living will - [place];Living will [place];Living will [place];Living will  Does patient want to make changes to medical advance directive? - No - Patient declined - - - No - Patient declined -  Copy of [place] in Chart? No - copy requested - No - copy requested No - copy requested - Yes No - copy requested     Current Medications (verified) Outpatient Encounter Medications as of 08/10/2021  Medication Sig   acetaminophen (TYLENOL) 500 MG tablet Take 500 mg by mouth 3 (three) times daily as needed (for pain.).   amLODipine (NORVASC) 5 MG tablet Take 1 tablet (5 mg total) by mouth daily.   b complex vitamins capsule Take 1 capsule by mouth daily.   calcium carbonate (OSCAL) 1500 (600 Ca) MG TABS tablet Take 600 mg of elemental calcium by mouth 2 (two) times daily with a meal.   fluticasone (FLONASE) 50 MCG/ACT nasal spray SHAKE LIQUID AND USE 2 SPRAYS IN EACH NOSTRIL DAILY   gabapentin (NEURONTIN) 100 MG capsule TAKE 2 CAPSULES(200 MG) BY MOUTH AT BEDTIME   Omega-3 Fatty Acids (FISH OIL) 1000 MG CAPS Take by mouth.   omeprazole (PRILOSEC) 40 MG capsule TAKE 1 CAPSULE(40 MG) BY MOUTH DAILY   rosuvastatin (CRESTOR) 20 MG tablet TAKE 1 TABLET(20 MG) BY MOUTH DAILY   Semaglutide (RYBELSUS) 3 MG TABS Take 3 mg by mouth daily.   XTANDI 80 MG tablet Take by mouth.   metFORMIN (GLUCOPHAGE XR) 500 MG 24 hr tablet Take 2 tablets (1,000 mg total) by mouth 2 (two) times daily before a meal.   No facility-administered encounter medications on file as of 08/10/2021.    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  (Catheterization in 2007, demonstrating 95% mid-LAD stenosis.  The  circumflex has 20% proximal stenosis, the right coronary artery had 20% mid stenosis.  The EF was 60%.  He had a non drug eluting stent placed.     Diverticulosis of colon (without mention of hemorrhage)    Dyspnea    mild with extreme exertion   Esophageal reflux    Esophageal stricture    History of bronchitis    HLD (hyperlipidemia)    Hot flashes    Malignant neoplasm of prostate (HCC)    Osteoarthrosis,  unspecified whether generalized or localized, unspecified site    Osteoarthrosis, unspecified whether generalized or localized, unspecified site    Other and unspecified hyperlipidemia    Pulmonary nodule    has resolved   Recurrent aspiration bronchitis/pneumonia    Skin cancer    Spinal stenosis    Type 2 diabetes mellitus ([place])    Unspecified essential hypertension    Past Surgical History:  Procedure Laterality Date   CARDIAC CATHETERIZATION  2007   COLONOSCOPY  03/13/2013   DISTAL BICEPS TENDON REPAIR Right 03/12/2015   Procedure: REPAIR/RECONSTRUCTION RIGHT BICEPS TENDON;  Surgeon: Daryll Brod, MD;  Location: [facility];  Service: Orthopedics;  Laterality: Right;   ESOPHAGOGASTRODUODENOSCOPY  2011   eyelid surgery for Lid Lag     FOOT SURGERY     HYDROCELE EXCISION / REPAIR     INGUINAL HERNIA REPAIR     81 yrs old   MASTECTOMY Left    benign tumor   Prostate treatment     cancer   SKIN CANCER EXCISION  2012   skin cancer on right ear   TOTAL HIP ARTHROPLASTY Left 10/23/2017   Procedure: LEFT TOTAL HIP ARTHROPLASTY ANTERIOR APPROACH;  Surgeon: Gaynelle Arabian, MD;  Location: [facility];  Service: Orthopedics;  Laterality: Left;   Family History  Problem Relation Age of Onset   Prostate cancer Father    Prostate cancer Brother    Colon cancer Neg Hx    Social History   Socioeconomic History   Marital status: Married    Spouse name: Not on file   Number of children: 1   Years of education: Not on file   Highest education level: Not on file  Occupational History   Occupation: Psychologist, occupational  Tobacco Use   Smoking status: Former    Packs/day: 1.00    Years: 18.00    Pack years: 18.00    Types: Cigarettes    Quit date: 04/24/1977    Years since quitting: 44.3   Smokeless tobacco: Former    Types: Snuff, Chew  Vaping Use   Vaping Use: Never used  Substance and Sexual Activity   Alcohol use: Yes    Alcohol/week: 12.0 standard drinks    Types: 12  Cans of beer per week    Comment: 1-2 beers a day   Drug use: No   Sexual activity: Not on file  Other Topics Concern   Not on file  Social History Narrative   Not on file   Social Determinants of Health   Financial Resource Strain: Low Risk    Difficulty of Paying Living Expenses: Not hard at all  Food Insecurity: No Food Insecurity   Worried About Charity fundraiser in the Last Year: Never true   [place] in the Last Year: Never true  Transportation Needs: No Transportation Needs   Lack of Transportation (Medical): No   Lack of Transportation (Non-Medical): No  Physical Activity: Insufficiently Active   Days  of Exercise per Week: 3 days   Minutes of Exercise per Session: 30 min  Stress: No Stress Concern Present   Feeling of Stress : Not at all  Social Connections: Moderately Isolated   Frequency of Communication with Friends and Family: Twice a week   Frequency of Social Gatherings with Friends and Family: Twice a week   Attends Religious Services: Never   Printmaker: No   Attends Music therapist: Never   Marital Status: Married    Tobacco Counseling Counseling given: Not Answered   Clinical Intake:  Pre-visit preparation completed: Yes  Pain : No/denies pain     Nutritional Risks: None Diabetes: Yes CBG done?: No Did pt. bring in CBG monitor from home?: No  How often do you need to have someone help you when you read instructions, pamphlets, or other written materials from your doctor or pharmacy?: 1 - Never What is the last grade level you completed in school?: college  Diabetic?yes Nutrition Risk Assessment:  Has the patient had any N/V/D within the last 2 months?  No  Does the patient have any non-healing wounds?  No  Has the patient had any unintentional weight loss or weight gain?  No   Diabetes:  Is the patient diabetic?  Yes  If diabetic, was a CBG obtained today?  No  Did the patient bring  in their glucometer from home?  No  How often do you monitor your CBG's? Never .   Financial Strains and Diabetes Management:  Are you having any financial strains with the device, your supplies or your medication? No .  Does the patient want to be seen by Chronic Care Management for management of their diabetes?  No  Would the patient like to be referred to a Nutritionist or for Diabetic Management?  No   Diabetic Exams:  Diabetic Eye Exam: Completed 07/2021 Diabetic Foot Exam: Overdue, Pt has been advised about the importance in completing this exam. Pt is scheduled for diabetic foot exam on next office visit .   Interpreter Needed?: No  Information entered by :: [place] of Daily Living In your present state of health, do you have any difficulty performing the following activities: 08/10/2021  Hearing? N  Vision? N  Difficulty concentrating or making decisions? N  Walking or climbing stairs? N  Dressing or bathing? N  Doing errands, shopping? N  Preparing Food and eating ? N  Using the Toilet? N  In the past six months, have you accidently leaked urine? N  Do you have problems with loss of bowel control? N  Managing your Medications? N  Managing your Finances? N  Housekeeping or managing your Housekeeping? N  Some recent data might be hidden    Patient Care Team: Libby Maw, MD as PCP - General (Family Medicine) Noralee Space, MD (Pulmonary Disease) Germaine Pomfret, [facility] as Pharmacist (Pharmacist)  Indicate any recent Medical Services you may have received from other than [facility] providers in the past year (date may be approximate).     Assessment:   This is a routine wellness examination for Hassen.  Hearing/Vision screen Vision Screening - Comments:: Annual eye exams wear glasses   Dietary issues and exercise activities discussed: Current Exercise Habits: Home exercise routine, Type of exercise: walking, Time (Minutes): 30, Frequency  (Times/Week): 3, Weekly Exercise (Minutes/Week): 90, Intensity: Mild, Exercise limited by: None identified   Goals Addressed   None  Depression Screen PHQ 2/9 Scores 08/10/2021 08/10/2021 08/10/2021 07/13/2021 05/05/2021 01/31/2020 01/31/2020  PHQ - 2 Score 0 0 0 0 0 0 0  PHQ- 9 Score - - - - - 1 -    Fall Risk Fall Risk  08/10/2021 07/13/2021 05/05/2021 01/31/2020 02/05/2013  Falls in the past year? 0 0 0 0 No  Number falls in past yr: 0 0 - - -  Injury with Fall? 0 - - - -  Risk for fall due to : No Fall Risks - - - -  Follow up Falls evaluation completed - - - -    FALL RISK PREVENTION PERTAINING TO THE HOME:  Any stairs in or around the home? Yes  If so, are there any without handrails? No  Home free of loose throw rugs in walkways, pet beds, electrical cords, etc? Yes  Adequate lighting in your home to reduce risk of falls? Yes   ASSISTIVE DEVICES UTILIZED TO PREVENT FALLS:  Life alert? No  Use of a cane, walker or w/c? No  Grab bars in the bathroom? Yes  Shower chair or bench in shower? Yes  Elevated toilet seat or a handicapped toilet? Yes     Cognitive Function:  Normal cognitive status assessed by direct observation by this Nurse Health Advisor. No abnormalities found.        Immunizations Immunization History  Administered Date(s) Administered   Fluad Quad(high Dose 65+) 03/26/2019, 05/05/2021   Influenza Split 05/08/2012, 06/03/2013   Influenza Whole 08/19/2009   Influenza, High Dose Seasonal PF 06/29/2016, 06/29/2017, 06/08/2018   Influenza,inj,Quad PF,6+ Mos 04/18/2014, 06/01/2015   Influenza,inj,quad, With Preservative 06/17/2017   PFIZER(Purple Top)SARS-COV-2 Vaccination 11/06/2019, 11/25/2019, 09/08/2020   Pneumococcal Conjugate-13 08/13/2013   Tdap 02/10/2014    TDAP status: Up to date  Flu Vaccine status: Up to date  Pneumococcal vaccine status: Up to date  Covid-19 vaccine status: Completed vaccines  Qualifies for Shingles Vaccine? Yes    Zostavax completed No   Shingrix Completed?: No.    Education has been provided regarding the importance of this vaccine. Patient has been advised to call insurance company to determine out of pocket expense if they have not yet received this vaccine. Advised may also receive vaccine at local pharmacy or Health Dept. Verbalized acceptance and understanding.  Screening Tests Health Maintenance  Topic Date Due   FOOT EXAM  Never done   Hepatitis C Screening  Never done   Zoster Vaccines- Shingrix (1 of 2) Never done   Pneumonia Vaccine 42+ Years old (2 - PPSV23 if available, else PCV20) 08/13/2014   OPHTHALMOLOGY EXAM  09/08/2021   HEMOGLOBIN A1C  11/09/2021   URINE MICROALBUMIN  05/11/2022   TETANUS/TDAP  02/11/2024   INFLUENZA VACCINE  Completed   HPV VACCINES  Aged Out   COVID-19 Vaccine  Discontinued    Health Maintenance  Health Maintenance Due  Topic Date Due   FOOT EXAM  Never done   Hepatitis C Screening  Never done   Zoster Vaccines- Shingrix (1 of 2) Never done   Pneumonia Vaccine 43+ Years old (2 - PPSV23 if available, else PCV20) 08/13/2014    Colorectal cancer screening: No longer required.   Lung Cancer Screening: (Low Dose CT Chest recommended if Age 34-80 years, 30 pack-year currently smoking OR have quit w/in 15years.) does not qualify.   Lung Cancer Screening Referral: n/a  Additional Screening:  Hepatitis C Screening: does not qualify;   Vision Screening: Recommended annual ophthalmology exams for early detection  of glaucoma and other disorders of the eye. Is the patient up to date with their annual eye exam?  Yes  Who is the provider or what is the name of the office in which the patient attends annual eye exams? Dr. Katy Fitch  If pt is not established with a provider, would they like to be referred to a provider to establish care? No .   Dental Screening: Recommended annual dental exams for proper oral hygiene  Community Resource Referral / Chronic Care  Management: CRR required this visit?  No   CCM required this visit?  No      Plan:     I have personally reviewed and noted the following in the patients chart:   Medical and social history Use of alcohol, tobacco or illicit drugs  Current medications and supplements including opioid prescriptions. Patient is not currently taking opioid prescriptions. Functional ability and status Nutritional status Physical activity Advanced directives List of other physicians Hospitalizations, surgeries, and ER visits in previous 12 months Vitals Screenings to include cognitive, depression, and falls Referrals and appointments  In addition, I have reviewed and discussed with patient certain preventive protocols, quality metrics, and best practice recommendations. A written personalized care plan for preventive services as well as general preventive health recommendations were provided to patient.     Randel Pigg, LPN   [id number]/47/[id number]   Nurse Notes: none

## 2021-08-20 DIAGNOSIS — K828 Other specified diseases of gallbladder: Secondary | ICD-10-CM | POA: Diagnosis not present

## 2021-08-20 DIAGNOSIS — K8020 Calculus of gallbladder without cholecystitis without obstruction: Secondary | ICD-10-CM | POA: Diagnosis not present

## 2021-08-20 DIAGNOSIS — K760 Fatty (change of) liver, not elsewhere classified: Secondary | ICD-10-CM | POA: Diagnosis not present

## 2021-08-24 DIAGNOSIS — K5792 Diverticulitis of intestine, part unspecified, without perforation or abscess without bleeding: Secondary | ICD-10-CM | POA: Diagnosis not present

## 2021-08-26 DIAGNOSIS — K819 Cholecystitis, unspecified: Secondary | ICD-10-CM | POA: Diagnosis not present

## 2021-08-27 DIAGNOSIS — I440 Atrioventricular block, first degree: Secondary | ICD-10-CM | POA: Diagnosis not present

## 2021-08-27 DIAGNOSIS — K8010 Calculus of gallbladder with chronic cholecystitis without obstruction: Secondary | ICD-10-CM | POA: Diagnosis not present

## 2021-08-27 DIAGNOSIS — Z01810 Encounter for preprocedural cardiovascular examination: Secondary | ICD-10-CM | POA: Diagnosis not present

## 2021-08-29 DIAGNOSIS — I440 Atrioventricular block, first degree: Secondary | ICD-10-CM | POA: Diagnosis not present

## 2021-08-30 DIAGNOSIS — K8020 Calculus of gallbladder without cholecystitis without obstruction: Secondary | ICD-10-CM | POA: Diagnosis not present

## 2021-08-30 DIAGNOSIS — E119 Type 2 diabetes mellitus without complications: Secondary | ICD-10-CM | POA: Diagnosis not present

## 2021-08-30 DIAGNOSIS — K819 Cholecystitis, unspecified: Secondary | ICD-10-CM | POA: Diagnosis not present

## 2021-08-30 DIAGNOSIS — K8010 Calculus of gallbladder with chronic cholecystitis without obstruction: Secondary | ICD-10-CM | POA: Diagnosis not present

## 2021-08-30 DIAGNOSIS — Z7984 Long term (current) use of oral hypoglycemic drugs: Secondary | ICD-10-CM | POA: Diagnosis not present

## 2021-08-31 ENCOUNTER — Other Ambulatory Visit: Payer: Self-pay | Admitting: Family Medicine

## 2021-08-31 DIAGNOSIS — M545 Low back pain, unspecified: Secondary | ICD-10-CM

## 2021-09-04 DIAGNOSIS — H524 Presbyopia: Secondary | ICD-10-CM | POA: Diagnosis not present

## 2021-09-25 ENCOUNTER — Other Ambulatory Visit: Payer: Self-pay | Admitting: Family Medicine

## 2021-09-29 DIAGNOSIS — C61 Malignant neoplasm of prostate: Secondary | ICD-10-CM | POA: Diagnosis not present

## 2021-09-30 ENCOUNTER — Other Ambulatory Visit: Payer: Self-pay | Admitting: Family Medicine

## 2021-10-07 DIAGNOSIS — N5201 Erectile dysfunction due to arterial insufficiency: Secondary | ICD-10-CM | POA: Diagnosis not present

## 2021-10-07 DIAGNOSIS — C7951 Secondary malignant neoplasm of bone: Secondary | ICD-10-CM | POA: Diagnosis not present

## 2021-10-07 DIAGNOSIS — C61 Malignant neoplasm of prostate: Secondary | ICD-10-CM | POA: Diagnosis not present

## 2021-10-11 ENCOUNTER — Ambulatory Visit (INDEPENDENT_AMBULATORY_CARE_PROVIDER_SITE_OTHER): Payer: Medicare Other | Admitting: Family Medicine

## 2021-10-11 ENCOUNTER — Encounter: Payer: Self-pay | Admitting: Family Medicine

## 2021-10-11 VITALS — BP 138/76 | HR 72 | Temp 97.5°F | Ht 70.0 in | Wt 179.8 lb

## 2021-10-11 DIAGNOSIS — F5101 Primary insomnia: Secondary | ICD-10-CM | POA: Diagnosis not present

## 2021-10-11 DIAGNOSIS — H43399 Other vitreous opacities, unspecified eye: Secondary | ICD-10-CM

## 2021-10-11 DIAGNOSIS — E1165 Type 2 diabetes mellitus with hyperglycemia: Secondary | ICD-10-CM | POA: Diagnosis not present

## 2021-10-11 MED ORDER — ESZOPICLONE 2 MG PO TABS: 2.0000 mg | ORAL_TABLET | Freq: Every evening | ORAL | 2 refills | Status: DC | PRN

## 2021-10-11 NOTE — Progress Notes (Addendum)
? ?Established Patient Office Visit ? ?Subjective:  ?Patient ID: [name], male    DOB: [birth year]  Age: 81 y.o. MRN: [id number] ? ?CC:  ?Chief Complaint  ?Patient presents with  ? Follow-up  ?  3 month follow up, have noticed floaters in right eye.   ? ? ?HPI ?[name] presents for follow-up of diabetes.  He has been having floaters over the last 2 to 3 months.  Overdue for diabetic eye exam.  Having no issues taking Rybelsus.  He is intolerant of metformin.  Continues to work full-time at his company.  Request Xanax refilled for sleep.  Has difficulty falling asleep. ? ?Past Medical History:  ?Diagnosis Date  ? 1st degree AV block   ? Chronic  ? Aortic atherosclerosis ([place])   ? Biceps muscle tear   ? right  ? CAD (coronary artery disease)   ? anterior ischemia on a stress perfusion study.  (Catheterization in 2007, demonstrating 95% mid-LAD stenosis.  The circumflex has 20% proximal stenosis, the right coronary artery had 20% mid stenosis.  The EF was 60%.  He had a non drug eluting stent placed.    ? Diverticulosis of colon (without mention of hemorrhage)   ? Dyspnea   ? mild with extreme exertion  ? Esophageal reflux   ? Esophageal stricture   ? History of bronchitis   ? HLD (hyperlipidemia)   ? Hot flashes   ? Malignant neoplasm of prostate ([place])   ? Osteoarthrosis, unspecified whether generalized or localized, unspecified site   ? Osteoarthrosis, unspecified whether generalized or localized, unspecified site   ? Other and unspecified hyperlipidemia   ? Pulmonary nodule   ? has resolved  ? Recurrent aspiration bronchitis/pneumonia   ? Skin cancer   ? Spinal stenosis   ? Type 2 diabetes mellitus ([place])   ? Unspecified essential hypertension   ? ? ?Past Surgical History:  ?Procedure Laterality Date  ? CARDIAC CATHETERIZATION  2007  ? COLONOSCOPY  03/13/2013  ? DISTAL BICEPS TENDON REPAIR Right 03/12/2015  ? Procedure: REPAIR/RECONSTRUCTION RIGHT BICEPS TENDON;  Surgeon: Daryll Brod, MD;  Location: [facility];  Service: Orthopedics;  Laterality: Right;  ? ESOPHAGOGASTRODUODENOSCOPY  2011  ? eyelid surgery for Lid Lag    ? FOOT SURGERY    ? HYDROCELE EXCISION / REPAIR    ? INGUINAL HERNIA REPAIR    ? 81 yrs old  ? MASTECTOMY Left   ? benign tumor  ? Prostate treatment    ? cancer  ? SKIN CANCER EXCISION  2012  ? skin cancer on right ear  ? TOTAL HIP ARTHROPLASTY Left 10/23/2017  ? Procedure: LEFT TOTAL HIP ARTHROPLASTY ANTERIOR APPROACH;  Surgeon: Gaynelle Arabian, MD;  Location: [facility];  Service: Orthopedics;  Laterality: Left;  ? ? ?Family History  ?Problem Relation Age of Onset  ? Prostate cancer Father   ? Prostate cancer Brother   ? Colon cancer Neg Hx   ? ? ?Social History  ? ?Socioeconomic History  ? Marital status: Married  ?  Spouse name: Not on file  ? Number of children: 1  ? Years of education: Not on file  ? Highest education level: Not on file  ?Occupational History  ? Occupation: Psychologist, occupational  ?Tobacco Use  ? Smoking status: Former  ?  Packs/day: 1.00  ?  Years: 18.00  ?  Pack years: 18.00  ?  Types: Cigarettes  ?  Quit date: 04/24/1977  ?  Years  since quitting: 44.5  ? Smokeless tobacco: Former  ?  Types: Snuff, Chew  ?Vaping Use  ? Vaping Use: Never used  ?Substance and Sexual Activity  ? Alcohol use: Yes  ?  Alcohol/week: 12.0 standard drinks  ?  Types: 12 Cans of beer per week  ?  Comment: 1-2 beers a day  ? Drug use: No  ? Sexual activity: Not on file  ?Other Topics Concern  ? Not on file  ?Social History Narrative  ? Not on file  ? ?Social Determinants of Health  ? ?Financial Resource Strain: Low Risk   ? Difficulty of Paying Living Expenses: Not hard at all  ?Food Insecurity: No Food Insecurity  ? Worried About Charity fundraiser in the Last Year: Never true  ? Ran Out of Food in the Last Year: Never true  ?Transportation Needs: No Transportation Needs  ? Lack of Transportation (Medical): No  ? Lack of Transportation (Non-Medical): No  ?Physical Activity: Insufficiently Active  ?  Days of Exercise per Week: 3 days  ? Minutes of Exercise per Session: 30 min  ?Stress: No Stress Concern Present  ? Feeling of Stress : Not at all  ?Social Connections: Moderately Isolated  ? Frequency of Communication with Friends and Family: Twice a week  ? Frequency of Social Gatherings with Friends and Family: Twice a week  ? Attends Religious Services: Never  ? Active Member of Clubs or Organizations: No  ? Attends Archivist Meetings: Never  ? Marital Status: Married  ?Intimate Partner Violence: Not At Risk  ? Fear of Current or Ex-Partner: No  ? Emotionally Abused: No  ? Physically Abused: No  ? Sexually Abused: No  ? ? ?Outpatient Medications Prior to Visit  ?Medication Sig Dispense Refill  ? acetaminophen (TYLENOL) 500 MG tablet Take 500 mg by mouth 3 (three) times daily as needed (for pain.).    ? ALPRAZolam (XANAX) 0.5 MG tablet Take 0.5 mg by mouth at bedtime as needed.    ? amLODipine (NORVASC) 5 MG tablet Take 1 tablet (5 mg total) by mouth daily. 90 tablet 2  ? b complex vitamins capsule Take 1 capsule by mouth daily.    ? calcium carbonate (OSCAL) 1500 (600 Ca) MG TABS tablet Take 600 mg of elemental calcium by mouth 2 (two) times daily with a meal.    ? fluticasone (FLONASE) 50 MCG/ACT nasal spray SHAKE LIQUID AND USE 2 SPRAYS IN EACH NOSTRIL DAILY 16 g 6  ? gabapentin (NEURONTIN) 100 MG capsule TAKE 2 CAPSULES(200 MG) BY MOUTH AT BEDTIME 60 capsule 2  ? Omega-3 Fatty Acids (FISH OIL) 1000 MG CAPS Take by mouth.    ? omeprazole (PRILOSEC) 40 MG capsule TAKE 1 CAPSULE(40 MG) BY MOUTH DAILY 90 capsule 2  ? rosuvastatin (CRESTOR) 20 MG tablet TAKE 1 TABLET(20 MG) BY MOUTH DAILY 90 tablet 4  ? XTANDI 80 MG tablet Take by mouth.    ? RYBELSUS 3 MG TABS TAKE 1 TABLET BY MOUTH DAILY 30 tablet 2  ? metFORMIN (GLUCOPHAGE XR) 500 MG 24 hr tablet Take 2 tablets (1,000 mg total) by mouth 2 (two) times daily before a meal. 360 tablet 1  ? ?No facility-administered medications prior to visit.   ? ? ?Allergies  ?Allergen Reactions  ? Metformin Diarrhea  ? Simvastatin Other (See Comments)  ?   pt states INTOL to [facility] w/ leg pains  ? ? ?ROS ?Review of Systems  ?Constitutional:  Negative for diaphoresis, fatigue,  fever and unexpected weight change.  ?Respiratory: Negative.    ?Cardiovascular: Negative.   ?Gastrointestinal: Negative.   ?Endocrine: Negative for polyphagia and polyuria.  ?Genitourinary: Negative.   ?Musculoskeletal:  Negative for gait problem and joint swelling.  ?Psychiatric/Behavioral:  Positive for sleep disturbance. Negative for dysphoric mood.   ? ?  ?Objective:  ?  ?Physical Exam ?Vitals and nursing note reviewed.  ?Constitutional:   ?   General: He is not in acute distress. ?   Appearance: Normal appearance. He is not ill-appearing, toxic-appearing or diaphoretic.  ?HENT:  ?   Head: Normocephalic and atraumatic.  ?   Right Ear: External ear normal.  ?   Left Ear: External ear normal.  ?   Mouth/Throat:  ?   Mouth: Mucous membranes are moist.  ?   Pharynx: Oropharynx is clear. No oropharyngeal exudate or posterior oropharyngeal erythema.  ?Eyes:  ?   General: No scleral icterus.    ?   Right eye: No discharge.     ?   Left eye: No discharge.  ?   Extraocular Movements: Extraocular movements intact.  ?   Conjunctiva/sclera: Conjunctivae normal.  ?   Pupils: Pupils are equal, round, and reactive to light.  ?Cardiovascular:  ?   Rate and Rhythm: Normal rate and regular rhythm.  ?Pulmonary:  ?   Effort: Pulmonary effort is normal.  ?   Breath sounds: Normal breath sounds.  ?Musculoskeletal:  ?   Cervical back: No rigidity or tenderness.  ?Lymphadenopathy:  ?   Cervical: No cervical adenopathy.  ?Skin: ?   General: Skin is warm and dry.  ?Neurological:  ?   Mental Status: He is alert and oriented to person, place, and time.  ?Psychiatric:     ?   Mood and Affect: Mood normal.     ?   Behavior: Behavior normal.  ? ? ?BP 138/76 (BP Location: Right Arm, Patient Position: Sitting, Cuff Size:  Normal)   Pulse 72   Temp (!) 97.5 ?F (36.4 ?C) (Temporal)   Ht '5\' 10"'$  (1.778 m)   Wt 179 lb 12.8 oz (81.6 kg)   SpO2 98%   BMI 25.80 kg/m?  ?Wt Readings from Last 3 Encounters:  ?10/11/21 179 lb 12.8 oz (81.6 kg)  ?1

## 2021-10-12 LAB — COMPREHENSIVE METABOLIC PANEL
ALT: 17 U/L (ref 0–53)
AST: 17 U/L (ref 0–37)
Albumin: 4.2 g/dL (ref 3.5–5.2)
Alkaline Phosphatase: 53 U/L (ref 39–117)
BUN: 18 mg/dL (ref 6–23)
CO2: 23 mEq/L (ref 19–32)
Calcium: 9 mg/dL (ref 8.4–10.5)
Chloride: 104 mEq/L (ref 96–112)
Creatinine, Ser: 0.95 mg/dL (ref 0.40–1.50)
GFR: 75.8 mL/min (ref >60.00)
Glucose, Bld: 215 mg/dL — ABNORMAL HIGH (ref 70–99)
Potassium: 3.8 mEq/L (ref 3.5–5.1)
Sodium: 136 mEq/L (ref 135–145)
Total Bilirubin: 0.4 mg/dL (ref 0.2–1.2)
Total Protein: 6.5 g/dL (ref 6.0–8.3)

## 2021-10-12 LAB — HEMOGLOBIN A1C: Hgb A1c MFr Bld: 9 % — ABNORMAL HIGH (ref 4.6–6.5)

## 2021-10-12 LAB — CBC
HCT: 38.6 % — ABNORMAL LOW (ref 39.0–52.0)
Hemoglobin: 13.3 g/dL (ref 13.0–17.0)
MCHC: 34.5 g/dL (ref 30.0–36.0)
MCV: 93.4 fl (ref 78.0–100.0)
Platelets: 176 10*3/uL (ref 150.0–400.0)
RBC: 4.14 Mil/uL — ABNORMAL LOW (ref 4.22–5.81)
RDW: 13.5 % (ref 11.5–15.5)
WBC: 6.3 10*3/uL (ref 4.0–10.5)

## 2021-10-18 MED ORDER — RYBELSUS 7 MG PO TABS: 7.0000 mg | ORAL_TABLET | Freq: Every day | ORAL | 2 refills | Status: DC

## 2021-10-18 NOTE — Addendum Note (Signed)
Addended by: Jon Billings on: 10/18/2021 12:24 PM ? ? Modules accepted: Orders ? ?

## 2021-11-01 ENCOUNTER — Telehealth: Payer: Self-pay | Admitting: Family Medicine

## 2021-11-01 DIAGNOSIS — F5101 Primary insomnia: Secondary | ICD-10-CM

## 2021-11-01 MED ORDER — ESZOPICLONE 2 MG PO TABS: 2.0000 mg | ORAL_TABLET | Freq: Every evening | ORAL | 2 refills | Status: DC | PRN

## 2021-11-01 MED ORDER — ESZOPICLONE 2 MG PO TABS: 2.0000 mg | ORAL_TABLET | Freq: Every evening | ORAL | 2 refills | Status: AC | PRN

## 2021-11-01 NOTE — Telephone Encounter (Signed)
Wife called saying the Rx that was called in last time they were here was not at the pharmacy.. she could not tell me the name of the meds or what it was there for.  ?

## 2021-11-02 NOTE — Telephone Encounter (Signed)
Patient and wife aware that Rx was sent to pharmacy.  ?

## 2021-11-10 DIAGNOSIS — M545 Low back pain, unspecified: Secondary | ICD-10-CM | POA: Diagnosis not present

## 2021-11-11 ENCOUNTER — Telehealth: Payer: Self-pay | Admitting: Family Medicine

## 2021-11-11 NOTE — Telephone Encounter (Signed)
[facility] called asking if they can prescribe pt a Prednisone dose pack, is this ok per his last a1c ? ? ?CB: [phone], [place] ?

## 2021-11-12 NOTE — Telephone Encounter (Signed)
Called and spoke to stephanie from [facility]. Confirm Dr. Bebe Shaggy recommendations. Rep voiced understanding, pat will f/u with provider office if symptoms do not improved. Sw, cma ?

## 2021-11-22 DIAGNOSIS — M545 Low back pain, unspecified: Secondary | ICD-10-CM | POA: Diagnosis not present

## 2021-11-25 DIAGNOSIS — M545 Low back pain, unspecified: Secondary | ICD-10-CM | POA: Diagnosis not present

## 2021-11-25 DIAGNOSIS — C61 Malignant neoplasm of prostate: Secondary | ICD-10-CM | POA: Diagnosis not present

## 2021-11-25 DIAGNOSIS — C7951 Secondary malignant neoplasm of bone: Secondary | ICD-10-CM | POA: Diagnosis not present

## 2021-11-30 DIAGNOSIS — M5451 Vertebrogenic low back pain: Secondary | ICD-10-CM | POA: Diagnosis not present

## 2021-12-02 DIAGNOSIS — M5416 Radiculopathy, lumbar region: Secondary | ICD-10-CM | POA: Diagnosis not present

## 2021-12-14 DIAGNOSIS — M5416 Radiculopathy, lumbar region: Secondary | ICD-10-CM | POA: Diagnosis not present

## 2021-12-14 DIAGNOSIS — M5451 Vertebrogenic low back pain: Secondary | ICD-10-CM | POA: Diagnosis not present

## 2021-12-23 DIAGNOSIS — C7951 Secondary malignant neoplasm of bone: Secondary | ICD-10-CM | POA: Diagnosis not present

## 2021-12-23 DIAGNOSIS — C61 Malignant neoplasm of prostate: Secondary | ICD-10-CM | POA: Diagnosis not present

## 2021-12-30 DIAGNOSIS — C61 Malignant neoplasm of prostate: Secondary | ICD-10-CM | POA: Diagnosis not present

## 2021-12-30 DIAGNOSIS — C7951 Secondary malignant neoplasm of bone: Secondary | ICD-10-CM | POA: Diagnosis not present

## 2022-01-01 ENCOUNTER — Other Ambulatory Visit: Payer: Self-pay | Admitting: Family Medicine

## 2022-01-01 DIAGNOSIS — G8929 Other chronic pain: Secondary | ICD-10-CM

## 2022-01-07 ENCOUNTER — Ambulatory Visit: Payer: Medicare Other | Admitting: Dietician

## 2022-01-26 DIAGNOSIS — C61 Malignant neoplasm of prostate: Secondary | ICD-10-CM | POA: Diagnosis not present

## 2022-01-26 DIAGNOSIS — C7951 Secondary malignant neoplasm of bone: Secondary | ICD-10-CM | POA: Diagnosis not present

## 2022-01-30 ENCOUNTER — Other Ambulatory Visit: Payer: Self-pay | Admitting: Family Medicine

## 2022-01-30 DIAGNOSIS — I10 Essential (primary) hypertension: Secondary | ICD-10-CM

## 2022-01-30 DIAGNOSIS — E1165 Type 2 diabetes mellitus with hyperglycemia: Secondary | ICD-10-CM

## 2022-02-23 DIAGNOSIS — C61 Malignant neoplasm of prostate: Secondary | ICD-10-CM | POA: Diagnosis not present

## 2022-02-23 DIAGNOSIS — C7951 Secondary malignant neoplasm of bone: Secondary | ICD-10-CM | POA: Diagnosis not present

## 2022-03-11 NOTE — Telephone Encounter (Signed)
Na

## 2022-03-17 DIAGNOSIS — S29019A Strain of muscle and tendon of unspecified wall of thorax, initial encounter: Secondary | ICD-10-CM | POA: Diagnosis not present

## 2022-03-23 DIAGNOSIS — C61 Malignant neoplasm of prostate: Secondary | ICD-10-CM | POA: Diagnosis not present

## 2022-03-23 DIAGNOSIS — C7951 Secondary malignant neoplasm of bone: Secondary | ICD-10-CM | POA: Diagnosis not present

## 2022-03-31 DIAGNOSIS — C61 Malignant neoplasm of prostate: Secondary | ICD-10-CM | POA: Diagnosis not present

## 2022-04-01 ENCOUNTER — Other Ambulatory Visit: Payer: Self-pay | Admitting: Family Medicine

## 2022-04-01 DIAGNOSIS — G8929 Other chronic pain: Secondary | ICD-10-CM

## 2022-04-07 DIAGNOSIS — C61 Malignant neoplasm of prostate: Secondary | ICD-10-CM | POA: Diagnosis not present

## 2022-04-07 DIAGNOSIS — N5201 Erectile dysfunction due to arterial insufficiency: Secondary | ICD-10-CM | POA: Diagnosis not present

## 2022-04-07 DIAGNOSIS — C7951 Secondary malignant neoplasm of bone: Secondary | ICD-10-CM | POA: Diagnosis not present

## 2022-04-20 DIAGNOSIS — C61 Malignant neoplasm of prostate: Secondary | ICD-10-CM | POA: Diagnosis not present

## 2022-04-20 DIAGNOSIS — C7951 Secondary malignant neoplasm of bone: Secondary | ICD-10-CM | POA: Diagnosis not present

## 2022-05-05 ENCOUNTER — Telehealth: Payer: Self-pay

## 2022-05-05 NOTE — Patient Outreach (Signed)
  Care Coordination   05/05/2022 Name: [name] MRN: [id number] DOB: [birth year]   Care Coordination Outreach Attempts:  An unsuccessful telephone outreach was attempted today to offer the patient information about available care coordination services as a benefit of their health plan.   Follow Up Plan:  Additional outreach attempts will be made to offer the patient care coordination information and services.   Encounter Outcome:  No Answer  Care Coordination Interventions Activated:  No   Care Coordination Interventions:  No, not indicated    Jone Baseman, RN, MSN [facility] Care Management Care Management Coordinator Direct Line [phone]

## 2022-05-06 ENCOUNTER — Other Ambulatory Visit: Payer: Self-pay | Admitting: Family Medicine

## 2022-05-06 DIAGNOSIS — E1165 Type 2 diabetes mellitus with hyperglycemia: Secondary | ICD-10-CM

## 2022-05-18 DIAGNOSIS — C7951 Secondary malignant neoplasm of bone: Secondary | ICD-10-CM | POA: Diagnosis not present

## 2022-05-18 DIAGNOSIS — C61 Malignant neoplasm of prostate: Secondary | ICD-10-CM | POA: Diagnosis not present

## 2022-05-20 ENCOUNTER — Telehealth: Payer: Self-pay

## 2022-05-20 NOTE — Patient Outreach (Signed)
  Care Coordination   05/20/2022 Name: [name] MRN: [id number] DOB: [birth year]   Care Coordination Outreach Attempts:  A second unsuccessful outreach was attempted today to offer the patient with information about available care coordination services as a benefit of their health plan.     Follow Up Plan:  Additional outreach attempts will be made to offer the patient care coordination information and services.   Encounter Outcome:  No Answer  Care Coordination Interventions Activated:  No   Care Coordination Interventions:  No, not indicated    Jone Baseman, RN, MSN [facility] Care Management Care Management Coordinator Direct Line [phone]

## 2022-05-24 DIAGNOSIS — R0781 Pleurodynia: Secondary | ICD-10-CM | POA: Diagnosis not present

## 2022-05-24 DIAGNOSIS — M546 Pain in thoracic spine: Secondary | ICD-10-CM | POA: Diagnosis not present

## 2022-06-06 ENCOUNTER — Telehealth: Payer: Self-pay

## 2022-06-06 NOTE — Patient Outreach (Signed)
  Care Coordination   06/06/2022 Name: [name] MRN: [id number] DOB: 10-19-1940   Care Coordination Outreach Attempts:  A third unsuccessful outreach was attempted today to offer the patient with information about available care coordination services as a benefit of their health plan.   Follow Up Plan:  No further outreach attempts will be made at this time. We have been unable to contact the patient to offer or enroll patient in care coordination services  Encounter Outcome:  No Answer  Care Coordination Interventions Activated:  No   Care Coordination Interventions:  No, not indicated    Jone Baseman, RN, MSN [facility] Care Management Coordinator Direct Line [phone]

## 2022-06-10 ENCOUNTER — Other Ambulatory Visit: Payer: Self-pay | Admitting: Family Medicine

## 2022-06-15 DIAGNOSIS — C61 Malignant neoplasm of prostate: Secondary | ICD-10-CM | POA: Diagnosis not present

## 2022-06-15 DIAGNOSIS — C7951 Secondary malignant neoplasm of bone: Secondary | ICD-10-CM | POA: Diagnosis not present

## 2022-07-02 DIAGNOSIS — M542 Cervicalgia: Secondary | ICD-10-CM | POA: Diagnosis not present

## 2022-07-07 DIAGNOSIS — C61 Malignant neoplasm of prostate: Secondary | ICD-10-CM | POA: Diagnosis not present

## 2022-07-09 DIAGNOSIS — R051 Acute cough: Secondary | ICD-10-CM | POA: Diagnosis not present

## 2022-07-09 DIAGNOSIS — R0981 Nasal congestion: Secondary | ICD-10-CM | POA: Diagnosis not present

## 2022-07-09 DIAGNOSIS — R509 Fever, unspecified: Secondary | ICD-10-CM | POA: Diagnosis not present

## 2022-07-14 DIAGNOSIS — N5201 Erectile dysfunction due to arterial insufficiency: Secondary | ICD-10-CM | POA: Diagnosis not present

## 2022-07-14 DIAGNOSIS — C7951 Secondary malignant neoplasm of bone: Secondary | ICD-10-CM | POA: Diagnosis not present

## 2022-07-14 DIAGNOSIS — C61 Malignant neoplasm of prostate: Secondary | ICD-10-CM | POA: Diagnosis not present

## 2022-07-28 ENCOUNTER — Telehealth: Payer: Self-pay | Admitting: Family Medicine

## 2022-07-28 NOTE — Telephone Encounter (Signed)
Left message for patient to call back and schedule Medicare Annual Wellness Visit (AWV) in office.   If not able to come in office, please offer to do virtually or by telephone.  Left office number and my jabber [phone].  Last AWV:08/10/2021   Please schedule at anytime with Nurse Health Advisor.

## 2022-08-09 ENCOUNTER — Other Ambulatory Visit: Payer: Self-pay | Admitting: Family Medicine

## 2022-08-09 DIAGNOSIS — I2510 Atherosclerotic heart disease of native coronary artery without angina pectoris: Secondary | ICD-10-CM

## 2022-08-09 DIAGNOSIS — E7800 Pure hypercholesterolemia, unspecified: Secondary | ICD-10-CM

## 2022-08-12 ENCOUNTER — Other Ambulatory Visit: Payer: Self-pay | Admitting: Family Medicine

## 2022-08-12 DIAGNOSIS — G8929 Other chronic pain: Secondary | ICD-10-CM

## 2022-08-17 DIAGNOSIS — C61 Malignant neoplasm of prostate: Secondary | ICD-10-CM | POA: Diagnosis not present

## 2022-08-17 DIAGNOSIS — C7951 Secondary malignant neoplasm of bone: Secondary | ICD-10-CM | POA: Diagnosis not present

## 2022-08-19 DIAGNOSIS — M19011 Primary osteoarthritis, right shoulder: Secondary | ICD-10-CM | POA: Diagnosis not present

## 2022-09-01 DIAGNOSIS — M25511 Pain in right shoulder: Secondary | ICD-10-CM | POA: Diagnosis not present

## 2022-09-08 ENCOUNTER — Telehealth: Payer: Self-pay | Admitting: Family Medicine

## 2022-09-08 DIAGNOSIS — M75121 Complete rotator cuff tear or rupture of right shoulder, not specified as traumatic: Secondary | ICD-10-CM | POA: Diagnosis not present

## 2022-09-08 DIAGNOSIS — M19011 Primary osteoarthritis, right shoulder: Secondary | ICD-10-CM | POA: Diagnosis not present

## 2022-09-08 NOTE — Telephone Encounter (Signed)
Called patient to schedule Medicare Annual Wellness Visit (AWV). Left message for patient to call back and schedule Medicare Annual Wellness Visit (AWV).  Last date of AWV: 08/10/21  Please schedule an appointment at any time with [facility].  If any questions, please contact me at [phone].  Thank you ,  Barkley Boards AWV direct phone # [phone]

## 2022-09-15 DIAGNOSIS — Z961 Presence of intraocular lens: Secondary | ICD-10-CM | POA: Diagnosis not present

## 2022-09-15 DIAGNOSIS — C7951 Secondary malignant neoplasm of bone: Secondary | ICD-10-CM | POA: Diagnosis not present

## 2022-09-15 DIAGNOSIS — C61 Malignant neoplasm of prostate: Secondary | ICD-10-CM | POA: Diagnosis not present

## 2022-09-15 DIAGNOSIS — E119 Type 2 diabetes mellitus without complications: Secondary | ICD-10-CM | POA: Diagnosis not present

## 2022-09-20 DIAGNOSIS — C61 Malignant neoplasm of prostate: Secondary | ICD-10-CM | POA: Diagnosis not present

## 2022-09-20 DIAGNOSIS — Z23 Encounter for immunization: Secondary | ICD-10-CM | POA: Diagnosis not present

## 2022-09-20 DIAGNOSIS — E785 Hyperlipidemia, unspecified: Secondary | ICD-10-CM | POA: Diagnosis not present

## 2022-09-20 DIAGNOSIS — M792 Neuralgia and neuritis, unspecified: Secondary | ICD-10-CM | POA: Diagnosis not present

## 2022-09-20 DIAGNOSIS — E119 Type 2 diabetes mellitus without complications: Secondary | ICD-10-CM | POA: Diagnosis not present

## 2022-09-20 DIAGNOSIS — I10 Essential (primary) hypertension: Secondary | ICD-10-CM | POA: Diagnosis not present

## 2022-09-20 DIAGNOSIS — C7951 Secondary malignant neoplasm of bone: Secondary | ICD-10-CM | POA: Diagnosis not present

## 2022-09-22 DIAGNOSIS — M25511 Pain in right shoulder: Secondary | ICD-10-CM | POA: Diagnosis not present

## 2022-09-26 DIAGNOSIS — M25511 Pain in right shoulder: Secondary | ICD-10-CM | POA: Diagnosis not present

## 2022-09-28 DIAGNOSIS — M25511 Pain in right shoulder: Secondary | ICD-10-CM | POA: Diagnosis not present

## 2022-10-03 DIAGNOSIS — M25511 Pain in right shoulder: Secondary | ICD-10-CM | POA: Diagnosis not present

## 2022-10-05 DIAGNOSIS — M25511 Pain in right shoulder: Secondary | ICD-10-CM | POA: Diagnosis not present

## 2022-10-11 DIAGNOSIS — M542 Cervicalgia: Secondary | ICD-10-CM | POA: Diagnosis not present

## 2022-10-13 DIAGNOSIS — C61 Malignant neoplasm of prostate: Secondary | ICD-10-CM | POA: Diagnosis not present

## 2022-10-13 DIAGNOSIS — C7951 Secondary malignant neoplasm of bone: Secondary | ICD-10-CM | POA: Diagnosis not present

## 2022-10-24 DIAGNOSIS — M542 Cervicalgia: Secondary | ICD-10-CM | POA: Diagnosis not present

## 2022-10-24 DIAGNOSIS — M5412 Radiculopathy, cervical region: Secondary | ICD-10-CM | POA: Diagnosis not present

## 2022-10-26 ENCOUNTER — Telehealth: Payer: Self-pay | Admitting: Family Medicine

## 2022-10-26 NOTE — Telephone Encounter (Signed)
Called patient to schedule Medicare Annual Wellness Visit (AWV). Left message for patient to call back and schedule Medicare Annual Wellness Visit (AWV).  Last date of AWV: 08/10/21  Please schedule an appointment at any time with [facility].  If any questions, please contact me at [phone].  Thank you ,  Khi Mcmillen [facility] AWV direct phone # [phone] 

## 2022-10-27 DIAGNOSIS — C61 Malignant neoplasm of prostate: Secondary | ICD-10-CM | POA: Diagnosis not present

## 2022-11-03 ENCOUNTER — Other Ambulatory Visit: Payer: Self-pay | Admitting: Family Medicine

## 2022-11-03 DIAGNOSIS — C7951 Secondary malignant neoplasm of bone: Secondary | ICD-10-CM | POA: Diagnosis not present

## 2022-11-03 DIAGNOSIS — N5201 Erectile dysfunction due to arterial insufficiency: Secondary | ICD-10-CM | POA: Diagnosis not present

## 2022-11-03 DIAGNOSIS — C61 Malignant neoplasm of prostate: Secondary | ICD-10-CM | POA: Diagnosis not present

## 2022-11-03 DIAGNOSIS — I10 Essential (primary) hypertension: Secondary | ICD-10-CM | POA: Diagnosis not present

## 2022-11-03 DIAGNOSIS — I2510 Atherosclerotic heart disease of native coronary artery without angina pectoris: Secondary | ICD-10-CM | POA: Diagnosis not present

## 2022-11-03 DIAGNOSIS — E119 Type 2 diabetes mellitus without complications: Secondary | ICD-10-CM | POA: Diagnosis not present

## 2022-11-05 ENCOUNTER — Other Ambulatory Visit: Payer: Self-pay | Admitting: Family Medicine

## 2022-11-05 DIAGNOSIS — G8929 Other chronic pain: Secondary | ICD-10-CM

## 2022-11-22 DIAGNOSIS — C7951 Secondary malignant neoplasm of bone: Secondary | ICD-10-CM | POA: Diagnosis not present

## 2022-11-22 DIAGNOSIS — C61 Malignant neoplasm of prostate: Secondary | ICD-10-CM | POA: Diagnosis not present

## 2022-12-06 NOTE — Progress Notes (Unsigned)
Cardiology Office Note   Date:  12/08/2022   ID:  [name], DOB [birth year], MRN [id number]  PCP:  [name] Ran, MD  Cardiologist:   None Referring:  [name] Ran, MD  No chief complaint on file.     History of Present Illness: [name] is a 82 y.o. male who presents for evaluation of HLD, HTN, prostate CA and h/o CAD.  Cardiac catheterization in 2007 demonstrated 95% mid LAD stenosis treated with bare-metal stent by Dr. Samule Love, 20% proximal left circumflex stenosis, 20% mid RCA lesion, EF 60%.  He had a follow-up stress test in 2011 which was low risk.  I last saw him in October 2015, at which time he was doing well.  He was last seen in our office in 2019 prior to left hip surgery.    He presents for follow up.  He has been doing very well.  He is being managed for prostate cancer and that seems to be well-managed.  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  He still works as a Systems analyst with a few people.  He still has goats and carries 50 pound feet. The patient denies any new symptoms such as chest discomfort, neck or arm discomfort. There has been no new shortness of breath, PND or orthopnea. There have been no reported palpitations, presyncope or syncope.    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  (Catheterization in 2007, demonstrating 95% mid-LAD stenosis.  The circumflex has 20% proximal stenosis, the right coronary artery had 20% mid stenosis.  The EF was 60%.  He had a non drug eluting stent placed.     Diverticulosis of colon (without mention of hemorrhage)    Dyspnea    mild with extreme exertion   Esophageal reflux    Esophageal stricture    History of bronchitis    HLD (hyperlipidemia)    Hot flashes    Malignant neoplasm of prostate ([facility])     Osteoarthrosis, unspecified whether generalized or localized, unspecified site    Osteoarthrosis, unspecified whether generalized or localized, unspecified site    Other and unspecified hyperlipidemia    Pulmonary nodule    has resolved   Recurrent aspiration bronchitis/pneumonia    Skin cancer    Spinal stenosis    Type 2 diabetes mellitus ([facility])    Unspecified essential hypertension     Past Surgical History:  Procedure Laterality Date   CARDIAC CATHETERIZATION  2007   COLONOSCOPY  03/13/2013   DISTAL BICEPS TENDON REPAIR Right 03/12/2015   Procedure: REPAIR/RECONSTRUCTION RIGHT BICEPS TENDON;  Surgeon: [name] Salt, MD;  Location: [facility];  Service: Orthopedics;  Laterality: Right;   ESOPHAGOGASTRODUODENOSCOPY  2011   eyelid surgery for Lid Lag     FOOT SURGERY     HYDROCELE EXCISION / REPAIR     INGUINAL HERNIA REPAIR     82 yrs old   MASTECTOMY Left    benign tumor   Prostate treatment     cancer   SKIN CANCER EXCISION  2012   skin cancer on right ear   TOTAL HIP ARTHROPLASTY Left 10/23/2017   Procedure: LEFT TOTAL HIP ARTHROPLASTY ANTERIOR APPROACH;  Surgeon: [name] Gross, MD;  Location: [facility];  Service: Orthopedics;  Laterality: Left;     Current Outpatient Medications  Medication Sig Dispense Refill   acetaminophen (TYLENOL) 500 MG tablet Take 500 mg by mouth 3 (three) times daily as needed (for pain.).     aspirin EC 81 MG tablet Take 81 mg by mouth once.     b complex vitamins capsule Take 1 capsule by mouth daily.     calcium carbonate (OSCAL) 1500 (600 Ca) MG TABS tablet Take 600 mg of elemental calcium by mouth 2 (two) times daily with a meal.     celecoxib (CELEBREX) 200 MG capsule Take 200 mg by mouth 2 (two) times daily.     ERLEADA 60 MG tablet Take 240 mg by mouth daily.     eszopiclone (LUNESTA) 2 MG TABS tablet Take 1 tablet (2 mg total) by mouth at bedtime as needed for sleep. Take immediately before bedtime 30 tablet 2   fluticasone  (FLONASE) 50 MCG/ACT nasal spray SHAKE LIQUID AND USE 2 SPRAYS IN EACH NOSTRIL DAILY 16 g 6   gabapentin (NEURONTIN) 100 MG capsule TAKE 2 CAPSULES(200 MG) BY MOUTH AT BEDTIME 60 capsule 2   JARDIANCE 25 MG TABS tablet Take 25 mg by mouth daily.     lisinopril (ZESTRIL) 10 MG tablet Take 10 mg by mouth daily.     metFORMIN (GLUCOPHAGE-XR) 500 MG 24 hr tablet Take 500 mg by mouth daily with breakfast.     Omega-3 Fatty Acids (FISH OIL) 1000 MG CAPS Take by mouth.     omeprazole (PRILOSEC) 40 MG capsule TAKE 1 CAPSULE(40 MG) BY MOUTH DAILY 90 capsule 2   RYBELSUS 7 MG TABS TAKE 1 TABLET BY MOUTH DAILY 30 tablet 2   XTANDI 80 MG tablet Take by mouth.     amLODipine (NORVASC) 5 MG tablet Take 1.5 tablets (7.5 mg total) by mouth daily. 135 tablet 3   rosuvastatin (CRESTOR) 40 MG tablet Take 1 tablet (40 mg total) by mouth daily. 90 tablet 3   No current facility-administered medications for this visit.    Allergies:   Metformin and Simvastatin    Social History:  The patient  reports that he quit smoking about 45 years ago. His smoking use included cigarettes. He has a 18.00 pack-year smoking history. He has quit using smokeless tobacco.  His smokeless tobacco use included snuff and chew. He reports current alcohol use of about 12.0 standard drinks of alcohol per week. He reports that he does not use drugs.   Family History:  The patient's family history includes Prostate cancer in his brother and father.    ROS:  Please see the history of present illness.   Otherwise, review of systems are positive for none.   All other systems are reviewed and negative.    PHYSICAL EXAM: VS:  BP (!) 142/74 (BP Location: Left Arm, Patient Position: Sitting, Cuff Size: Normal)   Pulse 64   Ht 5\' 10"  (1.778 m)   Wt 160 lb 9.6 oz (72.8 kg)   SpO2 97%   BMI 23.04 kg/m  , BMI Body mass index is 23.04 kg/m. GENERAL:  Well appearing HEENT:  Pupils equal round and reactive, fundi not visualized, oral mucosa  unremarkable NECK:  No jugular venous distention, waveform within normal limits, carotid upstroke brisk and symmetric, no bruits, no thyromegaly LYMPHATICS:  No cervical, inguinal adenopathy LUNGS:  Clear to auscultation bilaterally BACK:  No CVA tenderness CHEST:  Unremarkable HEART:  PMI not displaced or sustained,S1 and  S2 within normal limits, no S3, no S4, no clicks, no rubs, 2 out of 6 apical brief systolic murmur radiating slightly out the aortic outflow tract, no diastolic murmurs ABD:  Flat, positive bowel sounds normal in frequency in pitch, no bruits, no rebound, no guarding, no midline pulsatile mass, no hepatomegaly, no splenomegaly EXT:  2 plus pulses throughout, no edema, no cyanosis no clubbing SKIN:  No rashes no nodules NEURO:  Cranial nerves II through XII grossly intact, motor grossly intact throughout PSYCH:  Cognitively intact, oriented to person place and time    EKG:  EKG is ordered today. The ekg ordered today demonstrates sinus rhythm rate 64, left axis deviation, first-degree AV block, no acute ST-T wave changes.   Recent Labs: No results found for requested labs within last 365 days.    Lipid Panel    Component Value Date/Time   CHOL 117 05/11/2021 0851   TRIG 171.0 (H) 05/11/2021 0851   TRIG 157 (H) 06/26/2006 0752   HDL 36.80 (L) 05/11/2021 0851   CHOLHDL 3 05/11/2021 0851   VLDL 34.2 05/11/2021 0851   LDLCALC 46 05/11/2021 0851   LDLDIRECT 59.0 05/11/2021 0851      Wt Readings from Last 3 Encounters:  12/08/22 160 lb 9.6 oz (72.8 kg)  10/11/21 179 lb 12.8 oz (81.6 kg)  07/13/21 180 lb 3.2 oz (81.7 kg)      Other studies Reviewed: Additional studies/ records that were reviewed today include: Primary care records including labs. Review of the above records demonstrates:  Please see elsewhere in the note.     ASSESSMENT AND PLAN:   CAD: He had bare-metal stenting in 2007.  He had no new symptoms and is very physically active.  We are  going to pursue aggressive risk reduction.     Hypertension:   The BP is not at target.  I am going to increase his amlodipine to 7 and half milligrams daily.  Hyperlipidemia: LDL was 63.  We had to clarify that he is actually not taking pravastatin and is taking Crestor.  I am going to increase this to 40 mg and repeat a lipid profile and LP(a) in about 3 months.    First-degree AV block:   He has no symptoms.  No change in therapy.     Current medicines are reviewed at length with the patient today.  The patient does not have concerns regarding medicines.  The following changes have been made:  no change  Labs/ tests ordered today include:    Orders Placed This Encounter  Procedures   Lipid panel   Lipoprotein A (LPA)   EKG 12-Lead     Disposition:   FU with me in one year.     Signed, Rollene Rotunda, MD  12/08/2022 10:38 AM    [facility]

## 2022-12-08 ENCOUNTER — Ambulatory Visit: Payer: Medicare Other | Attending: Cardiology | Admitting: Cardiology

## 2022-12-08 ENCOUNTER — Encounter: Payer: Self-pay | Admitting: Cardiology

## 2022-12-08 VITALS — BP 142/74 | HR 64 | Ht 70.0 in | Wt 160.6 lb

## 2022-12-08 DIAGNOSIS — I2510 Atherosclerotic heart disease of native coronary artery without angina pectoris: Secondary | ICD-10-CM | POA: Diagnosis not present

## 2022-12-08 DIAGNOSIS — I10 Essential (primary) hypertension: Secondary | ICD-10-CM | POA: Diagnosis not present

## 2022-12-08 DIAGNOSIS — E7800 Pure hypercholesterolemia, unspecified: Secondary | ICD-10-CM

## 2022-12-08 MED ORDER — AMLODIPINE BESYLATE 5 MG PO TABS
7.5000 mg | ORAL_TABLET | Freq: Every day | ORAL | 3 refills | Status: DC
Start: 2022-12-08 — End: 2024-03-04

## 2022-12-08 MED ORDER — ROSUVASTATIN CALCIUM 40 MG PO TABS
40.0000 mg | ORAL_TABLET | Freq: Every day | ORAL | 3 refills | Status: AC
Start: 2022-12-08 — End: ?

## 2022-12-08 NOTE — Patient Instructions (Addendum)
Medication Instructions:   INCREASE ROSUVASTATIN TO 40 MG ONCE DAILY= 2 OF THE 20 MG TABLETS ONCE DAILY  INCREASE AMLODIPINE TO 7.5 MG ONCE DAILY= 1 AND 1/2 OF THE 5 MG TABLET ONCE DAILY  *If you need a refill on your cardiac medications before your next appointment, please call your pharmacy*   Lab Work:  Your physician recommends that you return for lab work in: 3 MONTHS FASTING  If you have labs (blood work) drawn today and your tests are completely normal, you will receive your results only by: MyChart Message (if you have MyChart) OR A paper copy in the mail If you have any lab test that is abnormal or we need to change your treatment, we will call you to review the results.   Follow-Up: At [facility], you and your health needs are our priority.  As part of our continuing mission to provide you with exceptional heart care, we have created designated Provider Care Teams.  These Care Teams include your primary Cardiologist (physician) and Advanced Practice Providers (APPs -  Physician Assistants and Nurse Practitioners) who all work together to provide you with the care you need, when you need it.  We recommend signing up for the patient portal called "MyChart".  Sign up information is provided on this After Visit Summary.  MyChart is used to connect with patients for Virtual Visits (Telemedicine).  Patients are able to view lab/test results, encounter notes, upcoming appointments, etc.  Non-urgent messages can be sent to your provider as well.   To learn more about what you can do with MyChart, go to [email or web address].    Your next appointment:   3 MONTHS  Provider:   Rollene Rotunda MD

## 2022-12-20 DIAGNOSIS — C7951 Secondary malignant neoplasm of bone: Secondary | ICD-10-CM | POA: Diagnosis not present

## 2022-12-20 DIAGNOSIS — C61 Malignant neoplasm of prostate: Secondary | ICD-10-CM | POA: Diagnosis not present

## 2023-01-17 DIAGNOSIS — C61 Malignant neoplasm of prostate: Secondary | ICD-10-CM | POA: Diagnosis not present

## 2023-01-17 DIAGNOSIS — C7951 Secondary malignant neoplasm of bone: Secondary | ICD-10-CM | POA: Diagnosis not present

## 2023-01-30 ENCOUNTER — Other Ambulatory Visit: Payer: Self-pay | Admitting: Family Medicine

## 2023-01-30 DIAGNOSIS — I10 Essential (primary) hypertension: Secondary | ICD-10-CM

## 2023-01-30 DIAGNOSIS — C61 Malignant neoplasm of prostate: Secondary | ICD-10-CM | POA: Diagnosis not present

## 2023-01-30 DIAGNOSIS — E119 Type 2 diabetes mellitus without complications: Secondary | ICD-10-CM | POA: Diagnosis not present

## 2023-02-02 DIAGNOSIS — C61 Malignant neoplasm of prostate: Secondary | ICD-10-CM | POA: Diagnosis not present

## 2023-02-09 ENCOUNTER — Encounter: Payer: Self-pay | Admitting: Cardiology

## 2023-02-09 DIAGNOSIS — N5201 Erectile dysfunction due to arterial insufficiency: Secondary | ICD-10-CM | POA: Diagnosis not present

## 2023-02-09 DIAGNOSIS — C61 Malignant neoplasm of prostate: Secondary | ICD-10-CM | POA: Diagnosis not present

## 2023-02-09 DIAGNOSIS — C7951 Secondary malignant neoplasm of bone: Secondary | ICD-10-CM | POA: Diagnosis not present

## 2023-02-14 DIAGNOSIS — C7951 Secondary malignant neoplasm of bone: Secondary | ICD-10-CM | POA: Diagnosis not present

## 2023-02-14 DIAGNOSIS — C61 Malignant neoplasm of prostate: Secondary | ICD-10-CM | POA: Diagnosis not present

## 2023-03-04 ENCOUNTER — Other Ambulatory Visit: Payer: Self-pay | Admitting: Family Medicine

## 2023-03-15 DIAGNOSIS — C7951 Secondary malignant neoplasm of bone: Secondary | ICD-10-CM | POA: Diagnosis not present

## 2023-03-15 DIAGNOSIS — C61 Malignant neoplasm of prostate: Secondary | ICD-10-CM | POA: Diagnosis not present

## 2023-03-16 ENCOUNTER — Ambulatory Visit: Payer: Medicare Other | Admitting: Cardiology

## 2023-03-21 DIAGNOSIS — Z23 Encounter for immunization: Secondary | ICD-10-CM | POA: Diagnosis not present

## 2023-03-31 ENCOUNTER — Encounter: Payer: Self-pay | Admitting: *Deleted

## 2023-04-13 DIAGNOSIS — C7951 Secondary malignant neoplasm of bone: Secondary | ICD-10-CM | POA: Diagnosis not present

## 2023-04-13 DIAGNOSIS — C61 Malignant neoplasm of prostate: Secondary | ICD-10-CM | POA: Diagnosis not present

## 2023-04-17 DIAGNOSIS — D485 Neoplasm of uncertain behavior of skin: Secondary | ICD-10-CM | POA: Diagnosis not present

## 2023-05-09 ENCOUNTER — Other Ambulatory Visit: Payer: Self-pay | Admitting: Family Medicine

## 2023-05-11 DIAGNOSIS — C61 Malignant neoplasm of prostate: Secondary | ICD-10-CM | POA: Diagnosis not present

## 2023-05-11 DIAGNOSIS — C7951 Secondary malignant neoplasm of bone: Secondary | ICD-10-CM | POA: Diagnosis not present

## 2023-05-18 DIAGNOSIS — C44329 Squamous cell carcinoma of skin of other parts of face: Secondary | ICD-10-CM | POA: Diagnosis not present

## 2023-06-06 DIAGNOSIS — E119 Type 2 diabetes mellitus without complications: Secondary | ICD-10-CM | POA: Diagnosis not present

## 2023-06-08 DIAGNOSIS — C61 Malignant neoplasm of prostate: Secondary | ICD-10-CM | POA: Diagnosis not present

## 2023-06-08 DIAGNOSIS — C7951 Secondary malignant neoplasm of bone: Secondary | ICD-10-CM | POA: Diagnosis not present

## 2023-07-06 DIAGNOSIS — C61 Malignant neoplasm of prostate: Secondary | ICD-10-CM | POA: Diagnosis not present

## 2023-07-06 DIAGNOSIS — C7951 Secondary malignant neoplasm of bone: Secondary | ICD-10-CM | POA: Diagnosis not present

## 2023-07-20 DIAGNOSIS — C61 Malignant neoplasm of prostate: Secondary | ICD-10-CM | POA: Diagnosis not present

## 2023-07-27 DIAGNOSIS — C7951 Secondary malignant neoplasm of bone: Secondary | ICD-10-CM | POA: Diagnosis not present

## 2023-07-27 DIAGNOSIS — C61 Malignant neoplasm of prostate: Secondary | ICD-10-CM | POA: Diagnosis not present

## 2023-07-27 DIAGNOSIS — N5201 Erectile dysfunction due to arterial insufficiency: Secondary | ICD-10-CM | POA: Diagnosis not present

## 2023-08-03 DIAGNOSIS — C61 Malignant neoplasm of prostate: Secondary | ICD-10-CM | POA: Diagnosis not present

## 2023-08-03 DIAGNOSIS — C7951 Secondary malignant neoplasm of bone: Secondary | ICD-10-CM | POA: Diagnosis not present

## 2023-08-04 DIAGNOSIS — R0981 Nasal congestion: Secondary | ICD-10-CM | POA: Diagnosis not present

## 2023-08-04 DIAGNOSIS — R051 Acute cough: Secondary | ICD-10-CM | POA: Diagnosis not present

## 2023-08-04 DIAGNOSIS — J3489 Other specified disorders of nose and nasal sinuses: Secondary | ICD-10-CM | POA: Diagnosis not present

## 2023-08-04 DIAGNOSIS — J111 Influenza due to unidentified influenza virus with other respiratory manifestations: Secondary | ICD-10-CM | POA: Diagnosis not present

## 2023-08-04 DIAGNOSIS — M791 Myalgia, unspecified site: Secondary | ICD-10-CM | POA: Diagnosis not present

## 2023-08-04 DIAGNOSIS — R509 Fever, unspecified: Secondary | ICD-10-CM | POA: Diagnosis not present

## 2023-08-11 ENCOUNTER — Other Ambulatory Visit: Payer: Self-pay | Admitting: Adult Health

## 2023-08-11 DIAGNOSIS — R9389 Abnormal findings on diagnostic imaging of other specified body structures: Secondary | ICD-10-CM | POA: Diagnosis not present

## 2023-08-11 DIAGNOSIS — C61 Malignant neoplasm of prostate: Secondary | ICD-10-CM | POA: Diagnosis not present

## 2023-08-11 DIAGNOSIS — C7951 Secondary malignant neoplasm of bone: Secondary | ICD-10-CM

## 2023-08-15 ENCOUNTER — Ambulatory Visit
Admission: RE | Admit: 2023-08-15 | Discharge: 2023-08-15 | Disposition: A | Discharge status: Home / Self Care | Payer: Medicare Other | Source: Ambulatory Visit | Attending: Adult Health | Admitting: Adult Health

## 2023-08-15 DIAGNOSIS — C61 Malignant neoplasm of prostate: Secondary | ICD-10-CM | POA: Diagnosis not present

## 2023-08-15 DIAGNOSIS — C7951 Secondary malignant neoplasm of bone: Secondary | ICD-10-CM | POA: Diagnosis not present

## 2023-08-15 DIAGNOSIS — R918 Other nonspecific abnormal finding of lung field: Secondary | ICD-10-CM | POA: Diagnosis not present

## 2023-08-15 DIAGNOSIS — R9389 Abnormal findings on diagnostic imaging of other specified body structures: Secondary | ICD-10-CM

## 2023-08-15 MED ORDER — IOPAMIDOL (ISOVUE-300) INJECTION 61%
75.0000 mL | Freq: Once | INTRAVENOUS | Status: AC | PRN
  Administered 2023-08-15: 75 mL via INTRAVENOUS

## 2023-08-17 DIAGNOSIS — R918 Other nonspecific abnormal finding of lung field: Secondary | ICD-10-CM | POA: Diagnosis not present

## 2023-08-18 DIAGNOSIS — J189 Pneumonia, unspecified organism: Secondary | ICD-10-CM | POA: Diagnosis not present

## 2023-08-18 DIAGNOSIS — J984 Other disorders of lung: Secondary | ICD-10-CM | POA: Diagnosis not present

## 2023-08-25 ENCOUNTER — Other Ambulatory Visit: Payer: Self-pay

## 2023-08-25 ENCOUNTER — Ambulatory Visit: Payer: Medicare Other | Admitting: Internal Medicine

## 2023-08-25 ENCOUNTER — Encounter: Payer: Self-pay | Admitting: Internal Medicine

## 2023-08-25 VITALS — BP 128/78 | HR 99 | Temp 97.8°F | Ht 71.0 in | Wt 160.0 lb

## 2023-08-25 DIAGNOSIS — J22 Unspecified acute lower respiratory infection: Secondary | ICD-10-CM

## 2023-08-25 NOTE — Progress Notes (Signed)
 [facility]      Reason for Consult: Cavitary pulmonary lesions   Referring Physician: Dr. Shayne    Patient ID: [name], male    DOB: 1941-04-23, 83 y.o.   MRN: [id number]  HPI:   He is here for evaluation of for cavitary pneumonia He has a history of prostate cancer and has been on medication for that long-term and a remote smoking history and family history of lung cancer who comes in here for evaluation of CT scan findings.  ET noted with multifocal consolidations and nodules with some cavitary areas.  There is notable that the right upper lobe process is chronic and present at least since 2023.  He has had about a 20 pound weight loss over the last year that he attributes to poor appetite.  He has not had any fever, chills, night sweats.  He has some mild productive cough with clear sputum.  He denies any dyspnea on exertion.  He has been able to remain active.  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  (Catheterization in 2007, demonstrating 95% mid-LAD stenosis.  The circumflex has 20% proximal stenosis, the right coronary artery had 20% mid stenosis.  The EF was 60%.  He had a non drug eluting stent placed.     Diverticulosis of colon (without mention of hemorrhage)    Dyspnea    mild with extreme exertion   Esophageal reflux    Esophageal stricture    History of bronchitis    HLD (hyperlipidemia)    Hot flashes    Malignant neoplasm of prostate (HCC)    Osteoarthrosis, unspecified whether generalized or localized, unspecified site    Osteoarthrosis, unspecified whether generalized or localized, unspecified site    Other and unspecified hyperlipidemia    Pulmonary nodule    has resolved   Recurrent aspiration bronchitis/pneumonia    Skin cancer    Spinal stenosis     Type 2 diabetes mellitus (HCC)    Unspecified essential hypertension     Prior to Admission medications   Medication Sig Start Date End Date Taking? Authorizing Provider  acetaminophen  (TYLENOL ) 500 MG tablet Take 500 mg by mouth 3 (three) times daily as needed (for pain.).   Yes [provider]  amLODipine  (NORVASC ) 5 MG tablet Take 1.5 tablets (7.5 mg total) by mouth daily. 12/08/22  Yes Lavona Agent, MD  aspirin EC 81 MG tablet Take 81 mg by mouth once. 06/22/15  Yes [provider]  b complex vitamins capsule Take 1 capsule by mouth daily.   Yes [provider]  calcium  carbonate (OSCAL) 1500 (600 Ca) MG TABS tablet Take 600 mg of elemental calcium  by mouth 2 (two) times daily with a meal.   Yes [provider]  celecoxib (CELEBREX) 200 MG capsule Take 200 mg by mouth 2 (two) times daily.   Yes [provider]  ERLEADA 60 MG tablet Take 240 mg by mouth daily.   Yes [provider]  eszopiclone  (LUNESTA ) 2 MG TABS tablet Take 1 tablet (2 mg total) by mouth at bedtime as needed for sleep. Take immediately before bedtime 11/01/21  Yes Berneta Elsie Sayre, MD  fluticasone  (FLONASE ) 50 MCG/ACT nasal spray SHAKE LIQUID AND USE 2  SPRAYS IN EACH NOSTRIL DAILY 12/08/19  Yes Berneta Elsie Sayre, MD  gabapentin  (NEURONTIN ) 100 MG capsule TAKE 2 CAPSULES(200 MG) BY MOUTH AT BEDTIME 11/07/22  Yes Berneta Elsie Sayre, MD  JARDIANCE 25 MG TABS tablet Take 25 mg by mouth daily.   Yes [provider]  lisinopril  (ZESTRIL ) 10 MG tablet Take 10 mg by mouth daily.   Yes [provider]  metFORMIN  (GLUCOPHAGE -XR) 500 MG 24 hr tablet Take 500 mg by mouth daily with breakfast. 09/20/22  Yes [provider]  Omega-3 Fatty Acids (FISH OIL) 1000 MG CAPS Take by mouth.   Yes [provider]  omeprazole  (PRILOSEC) 40 MG capsule TAKE 1 CAPSULE(40 MG) BY MOUTH DAILY 06/12/22  Yes Berneta Elsie Sayre, MD  rosuvastatin   (CRESTOR ) 40 MG tablet Take 1 tablet (40 mg total) by mouth daily. 12/08/22  Yes Lavona Agent, MD  RYBELSUS  7 MG TABS TAKE 1 TABLET BY MOUTH DAILY 05/06/22  Yes Berneta Elsie Sayre, MD  XTANDI 80 MG tablet Take by mouth. 06/21/21  Yes [provider]    Allergies  Allergen Reactions   Metformin  Diarrhea   Simvastatin Other (See Comments)     pt states INTOL to ZOCOR w/ leg pains    Social History   Tobacco Use   Smoking status: Former    Current packs/day: 0.00    Average packs/day: 1 pack/day for 18.0 years (18.0 ttl pk-yrs)    Types: Cigarettes    Start date: 04/25/1959    Quit date: 04/24/1977    Years since quitting: 46.3   Smokeless tobacco: Former    Types: Snuff, Chew  Vaping Use   Vaping status: Never Used  Substance Use Topics   Alcohol  use: Yes    Alcohol /week: 12.0 standard drinks of alcohol     Types: 12 Cans of beer per week    Comment: 1-2 beers a day   Drug use: No    Family History  Problem Relation Age of Onset   Prostate cancer Father    Prostate cancer Brother    Colon cancer Neg Hx     Review of Systems  Constitutional: negative for fevers and chills All other systems reviewed and are negative    Constitutional: in no apparent distress  Vitals:   08/25/23 0946  BP: 128/78  Pulse: 99  Temp: 97.8 F (36.6 C)  SpO2: 96%   EYES: anicteric Respiratory: normal respiratory effort  Labs: Lab Results  Component Value Date   WBC 6.3 10/11/2021   HGB 13.3 10/11/2021   HCT 38.6 (L) 10/11/2021   MCV 93.4 10/11/2021   PLT 176.0 10/11/2021    Lab Results  Component Value Date   CREATININE 0.95 10/11/2021   BUN 18 10/11/2021   NA 136 10/11/2021   K 3.8 10/11/2021   CL 104 10/11/2021   CO2 23 10/11/2021    Lab Results  Component Value Date   ALT 17 10/11/2021   AST 17 10/11/2021   ALKPHOS 53 10/11/2021   BILITOT 0.4 10/11/2021   INR 0.99 03/06/2014     Assessment: He has multifocal consolidations and nodules that are  progressed from 2023 with relatively mild symptoms overall.  Differential for this is broad including infectious versus neoplasm.  Farren gold testing is positive.  Infectious etiology could be mycoplasma, fungi or bacterial.  He has seen pulmonary, Dr. Alaine plan for bronchoscopy.  Plan: 1) agree with bronchoscopy for sampling. Can follow-up here once we have some idea of the etiology.  I will have him scheduled for about 3 weeks but will await any positive results from any cultures or otherwise.  This was discussed with the patient.

## 2023-08-31 DIAGNOSIS — C61 Malignant neoplasm of prostate: Secondary | ICD-10-CM | POA: Diagnosis not present

## 2023-08-31 DIAGNOSIS — C7951 Secondary malignant neoplasm of bone: Secondary | ICD-10-CM | POA: Diagnosis not present

## 2023-09-06 DIAGNOSIS — Z87891 Personal history of nicotine dependence: Secondary | ICD-10-CM | POA: Diagnosis not present

## 2023-09-06 DIAGNOSIS — J189 Pneumonia, unspecified organism: Secondary | ICD-10-CM | POA: Diagnosis not present

## 2023-09-06 DIAGNOSIS — J984 Other disorders of lung: Secondary | ICD-10-CM | POA: Diagnosis not present

## 2023-09-11 DIAGNOSIS — E119 Type 2 diabetes mellitus without complications: Secondary | ICD-10-CM | POA: Diagnosis not present

## 2023-09-11 DIAGNOSIS — I10 Essential (primary) hypertension: Secondary | ICD-10-CM | POA: Diagnosis not present

## 2023-09-11 DIAGNOSIS — C7800 Secondary malignant neoplasm of unspecified lung: Secondary | ICD-10-CM | POA: Diagnosis not present

## 2023-09-11 DIAGNOSIS — Z1331 Encounter for screening for depression: Secondary | ICD-10-CM | POA: Diagnosis not present

## 2023-09-11 DIAGNOSIS — Z1339 Encounter for screening examination for other mental health and behavioral disorders: Secondary | ICD-10-CM | POA: Diagnosis not present

## 2023-09-11 DIAGNOSIS — Z0000 Encounter for general adult medical examination without abnormal findings: Secondary | ICD-10-CM | POA: Diagnosis not present

## 2023-09-15 IMAGING — NM NM BONE WHOLE BODY
2 series · 2 of 2 positions shown · non-contrast
Comparison: 05/15/2018

Radiographic correlation: CT abdomen and pelvis 06/28/2021

CLINICAL DATA: Prostate cancer, cervical spine pain, prior LEFT
total hip arthroplasty, no recent trauma

EXAM:
NUCLEAR MEDICINE WHOLE BODY BONE SCAN
TECHNIQUE: Whole body anterior and posterior images were obtained approximately
3 hours after intravenous injection of radiopharmaceutical.
RADIOPHARMACEUTICALS:  21.8 mCi Jechnetium-AAm MDP IV

[Series 1: whole body · 2.66mm/px · 1 of 1 slices shown (1 of 2)]
[im 1/1]
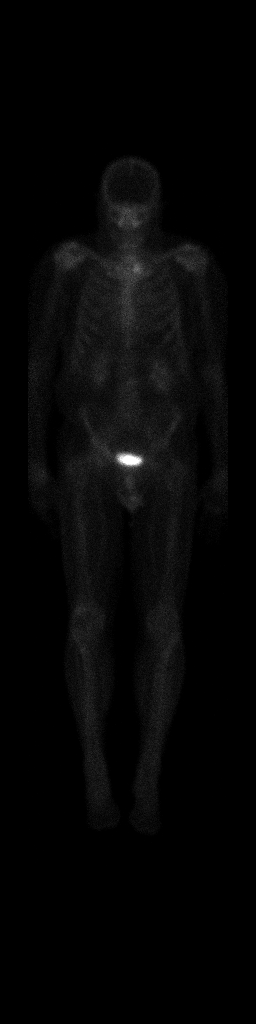

[Series 1: whole body · 2.66mm/px · 1 of 1 slices shown (2 of 2)]
[im 1/1]
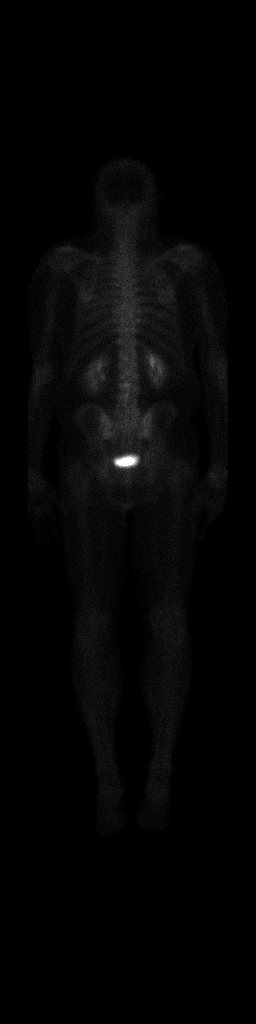

[2 of 2 positions shown; findings below may reference images not displayed]

FINDINGS: Uptake at sternoclavicular joints, AC joints, hips, RIGHT wrist,
typically degenerative.

Abnormal uptake in lumbar spine at LEFT superior L3 decreased from
prior exam; this corresponds to advanced degenerative disc disease
changes at L2-L3 on CT exam.

No new sites of abnormal tracer accumulation are identified to
suggest osseous metastatic disease.

Expected urinary tract and soft tissue distribution of tracer.
IMPRESSION: Scattered degenerative findings.

No scintigraphic evidence of osseous metastatic disease.

## 2023-09-18 DIAGNOSIS — E119 Type 2 diabetes mellitus without complications: Secondary | ICD-10-CM | POA: Diagnosis not present

## 2023-09-18 DIAGNOSIS — Z961 Presence of intraocular lens: Secondary | ICD-10-CM | POA: Diagnosis not present

## 2023-09-19 ENCOUNTER — Encounter: Payer: Self-pay | Admitting: Internal Medicine

## 2023-09-19 ENCOUNTER — Ambulatory Visit: Payer: Medicare Other | Admitting: Internal Medicine

## 2023-09-19 ENCOUNTER — Other Ambulatory Visit: Payer: Self-pay

## 2023-09-19 VITALS — BP 141/90 | HR 55 | Temp 97.9°F | Ht 71.0 in | Wt 159.0 lb

## 2023-09-19 DIAGNOSIS — J22 Unspecified acute lower respiratory infection: Secondary | ICD-10-CM | POA: Diagnosis not present

## 2023-09-19 NOTE — Assessment & Plan Note (Signed)
 He is feeling well and today no positive results so we will continue to monitor the cultures.  No treatment indicated at this time without a definitive diagnosis.  Concern for tuberculosis and he was advised that the home health department will be contacting him if it does turn positive for tuberculosis but otherwise will monitor and consider treatment options if something else grows.  I will have him scheduled for about 4 weeks time and should have some results by then.  I have personally spent 21 minutes involved in face-to-face and non-face-to-face activities for this patient on the day of the visit. Professional time spent includes the following activities: Preparing to see the patient (review of tests), Obtaining and/or reviewing separately obtained history (admission/discharge record), Performing a medically appropriate examination and/or evaluation , Ordering medications/tests/procedures, referring and communicating with other health care professionals, Documenting clinical information in the EMR, Independently interpreting results (not separately reported), Communicating results to the patient/family/caregiver, Counseling and educating the patient/family/caregiver and Care coordination (not separately reported).

## 2023-09-19 NOTE — Progress Notes (Signed)
   Subjective:    Patient ID: [name], male    DOB: [birth year], 83 y.o.   MRN: [id number]  HPI [name] is here for follow-up of cavitary pneumonia. I saw him initially and since then has undergone bronchoscopy with sampling for concern for mycobacterial tuberculosis among other etiologies.  Initial results are negative including pathology that does not note any malignant cells.  AFB smear is negative and no results yet of DNA probes or culture from the fungal or mycobacterial samples.  He otherwise is feeling well with no new symptoms including no hemoptysis, night sweats or other concerns.   Review of Systems  Constitutional:  Negative for fatigue and fever.  Gastrointestinal:  Negative for diarrhea.  Skin:  Negative for rash.       Objective:   Physical Exam Eyes:     General: No scleral icterus. Pulmonary:     Effort: Pulmonary effort is normal.  Neurological:     Mental Status: He is alert.   SH: no tobacco        Assessment & Plan:

## 2023-09-28 DIAGNOSIS — C61 Malignant neoplasm of prostate: Secondary | ICD-10-CM | POA: Diagnosis not present

## 2023-09-28 DIAGNOSIS — C7951 Secondary malignant neoplasm of bone: Secondary | ICD-10-CM | POA: Diagnosis not present

## 2023-10-04 ENCOUNTER — Other Ambulatory Visit: Payer: Self-pay | Admitting: Urology

## 2023-10-04 ENCOUNTER — Other Ambulatory Visit (HOSPITAL_COMMUNITY): Payer: Self-pay | Admitting: Urology

## 2023-10-04 DIAGNOSIS — C61 Malignant neoplasm of prostate: Secondary | ICD-10-CM

## 2023-10-04 DIAGNOSIS — C7951 Secondary malignant neoplasm of bone: Secondary | ICD-10-CM

## 2023-10-06 DIAGNOSIS — C61 Malignant neoplasm of prostate: Secondary | ICD-10-CM | POA: Diagnosis not present

## 2023-10-09 DIAGNOSIS — C44329 Squamous cell carcinoma of skin of other parts of face: Secondary | ICD-10-CM | POA: Diagnosis not present

## 2023-10-09 DIAGNOSIS — L570 Actinic keratosis: Secondary | ICD-10-CM | POA: Diagnosis not present

## 2023-10-18 ENCOUNTER — Ambulatory Visit: Admitting: Internal Medicine

## 2023-10-18 ENCOUNTER — Encounter: Payer: Self-pay | Admitting: Internal Medicine

## 2023-10-18 ENCOUNTER — Other Ambulatory Visit: Payer: Self-pay

## 2023-10-18 VITALS — BP 145/77 | HR 70 | Resp 16 | Wt 159.0 lb

## 2023-10-18 DIAGNOSIS — J181 Lobar pneumonia, unspecified organism: Secondary | ICD-10-CM | POA: Insufficient documentation

## 2023-10-18 DIAGNOSIS — E119 Type 2 diabetes mellitus without complications: Secondary | ICD-10-CM | POA: Diagnosis not present

## 2023-10-18 DIAGNOSIS — I10 Essential (primary) hypertension: Secondary | ICD-10-CM | POA: Diagnosis not present

## 2023-10-18 NOTE — Assessment & Plan Note (Signed)
 I again reviewed with him the results of the cultures and pathology and no positive findings other than chronic inflammation.  No etiology at this time.  No antibiotics indicated.  Will reach out to pulmonology to discuss whether a biopsy versus a PET scan may be indicated at this time.  I have personally spent 31 minutes involved in face-to-face and non-face-to-face activities for this patient on the day of the visit. Professional time spent includes the following activities: Preparing to see the patient (review of tests), Obtaining and/or reviewing separately obtained history (admission/discharge record), Performing a medically appropriate examination and/or evaluation , Ordering medications/tests/procedures, referring and communicating with other health care professionals, Documenting clinical information in the EMR, Independently interpreting results (not separately reported), Communicating results to the patient/family/caregiver, Counseling and educating the patient/family/caregiver and Care coordination (not separately reported).

## 2023-10-18 NOTE — Progress Notes (Signed)
   Subjective:    Patient ID: [name], male    DOB: Jan 04, 1941, 83 y.o.   MRN: [id number]  HPI [name] is here for follow-up of cavitary pneumonia I saw him initially and since then has undergone bronchoscopy with sampling for concern for mycobacterial tuberculosis among other etiologies.  Initial results are negative including pathology that does not note any malignant cells.  All cultures now at 6 weeks have remained negative.  There was some scant Candida and Saccharomyces.  Otherwise no significant findings.  He has noted some increased sputum production and coughing.  No fever.  Some mild weight loss.   Review of Systems  Constitutional:  Negative for fever.  Gastrointestinal:  Negative for diarrhea.  Skin:  Negative for rash.       Objective:   Physical Exam Eyes:     General: No scleral icterus. Pulmonary:     Effort: Pulmonary effort is normal.  Neurological:     Mental Status: He is alert.           Assessment & Plan:

## 2023-10-19 DIAGNOSIS — C61 Malignant neoplasm of prostate: Secondary | ICD-10-CM | POA: Diagnosis not present

## 2023-10-19 DIAGNOSIS — J984 Other disorders of lung: Secondary | ICD-10-CM | POA: Diagnosis not present

## 2023-10-19 DIAGNOSIS — J189 Pneumonia, unspecified organism: Secondary | ICD-10-CM | POA: Diagnosis not present

## 2023-10-25 DIAGNOSIS — J984 Other disorders of lung: Secondary | ICD-10-CM | POA: Diagnosis not present

## 2023-10-26 ENCOUNTER — Ambulatory Visit (HOSPITAL_COMMUNITY)
Admission: RE | Admit: 2023-10-26 | Discharge: 2023-10-26 | Disposition: A | Discharge status: Home / Self Care | Source: Ambulatory Visit | Attending: Urology | Admitting: Urology

## 2023-10-26 DIAGNOSIS — M7592 Shoulder lesion, unspecified, left shoulder: Secondary | ICD-10-CM | POA: Diagnosis not present

## 2023-10-26 DIAGNOSIS — C7951 Secondary malignant neoplasm of bone: Secondary | ICD-10-CM | POA: Diagnosis not present

## 2023-10-26 DIAGNOSIS — C61 Malignant neoplasm of prostate: Secondary | ICD-10-CM | POA: Diagnosis not present

## 2023-10-26 MED ORDER — TECHNETIUM TC 99M MEDRONATE IV KIT
20.0000 | PACK | Freq: Once | INTRAVENOUS | Status: AC | PRN
  Administered 2023-10-26: 20 via INTRAVENOUS

## 2023-11-01 DIAGNOSIS — J189 Pneumonia, unspecified organism: Secondary | ICD-10-CM | POA: Diagnosis not present

## 2023-11-01 DIAGNOSIS — C61 Malignant neoplasm of prostate: Secondary | ICD-10-CM | POA: Diagnosis not present

## 2023-11-01 DIAGNOSIS — R918 Other nonspecific abnormal finding of lung field: Secondary | ICD-10-CM | POA: Diagnosis not present

## 2023-11-01 DIAGNOSIS — J984 Other disorders of lung: Secondary | ICD-10-CM | POA: Diagnosis not present

## 2023-11-01 DIAGNOSIS — Z08 Encounter for follow-up examination after completed treatment for malignant neoplasm: Secondary | ICD-10-CM | POA: Diagnosis not present

## 2023-11-01 DIAGNOSIS — Z8546 Personal history of malignant neoplasm of prostate: Secondary | ICD-10-CM | POA: Diagnosis not present

## 2023-11-02 DIAGNOSIS — I10 Essential (primary) hypertension: Secondary | ICD-10-CM | POA: Diagnosis not present

## 2023-11-02 DIAGNOSIS — E119 Type 2 diabetes mellitus without complications: Secondary | ICD-10-CM | POA: Diagnosis not present

## 2023-11-06 ENCOUNTER — Other Ambulatory Visit: Payer: Self-pay | Admitting: Family Medicine

## 2023-11-07 ENCOUNTER — Other Ambulatory Visit: Payer: Self-pay | Admitting: Family Medicine

## 2023-11-07 DIAGNOSIS — I2510 Atherosclerotic heart disease of native coronary artery without angina pectoris: Secondary | ICD-10-CM

## 2023-11-07 DIAGNOSIS — E7800 Pure hypercholesterolemia, unspecified: Secondary | ICD-10-CM

## 2023-11-15 DIAGNOSIS — J984 Other disorders of lung: Secondary | ICD-10-CM | POA: Diagnosis not present

## 2023-11-15 DIAGNOSIS — R918 Other nonspecific abnormal finding of lung field: Secondary | ICD-10-CM | POA: Diagnosis not present

## 2023-11-15 DIAGNOSIS — I700 Atherosclerosis of aorta: Secondary | ICD-10-CM | POA: Diagnosis not present

## 2023-11-15 DIAGNOSIS — J189 Pneumonia, unspecified organism: Secondary | ICD-10-CM | POA: Diagnosis not present

## 2023-11-16 DIAGNOSIS — C3431 Malignant neoplasm of lower lobe, right bronchus or lung: Secondary | ICD-10-CM | POA: Diagnosis not present

## 2023-11-16 DIAGNOSIS — J189 Pneumonia, unspecified organism: Secondary | ICD-10-CM | POA: Diagnosis not present

## 2023-11-16 DIAGNOSIS — Z923 Personal history of irradiation: Secondary | ICD-10-CM | POA: Diagnosis not present

## 2023-11-16 DIAGNOSIS — I10 Essential (primary) hypertension: Secondary | ICD-10-CM | POA: Diagnosis not present

## 2023-11-16 DIAGNOSIS — K219 Gastro-esophageal reflux disease without esophagitis: Secondary | ICD-10-CM | POA: Diagnosis not present

## 2023-11-16 DIAGNOSIS — E119 Type 2 diabetes mellitus without complications: Secondary | ICD-10-CM | POA: Diagnosis not present

## 2023-11-16 DIAGNOSIS — Z801 Family history of malignant neoplasm of trachea, bronchus and lung: Secondary | ICD-10-CM | POA: Diagnosis not present

## 2023-11-16 DIAGNOSIS — Z79899 Other long term (current) drug therapy: Secondary | ICD-10-CM | POA: Diagnosis not present

## 2023-11-16 DIAGNOSIS — Z7984 Long term (current) use of oral hypoglycemic drugs: Secondary | ICD-10-CM | POA: Diagnosis not present

## 2023-11-16 DIAGNOSIS — J188 Other pneumonia, unspecified organism: Secondary | ICD-10-CM | POA: Diagnosis not present

## 2023-11-16 DIAGNOSIS — C3412 Malignant neoplasm of upper lobe, left bronchus or lung: Secondary | ICD-10-CM | POA: Diagnosis not present

## 2023-11-21 DIAGNOSIS — J95830 Postprocedural hemorrhage and hematoma of a respiratory system organ or structure following a respiratory system procedure: Secondary | ICD-10-CM | POA: Diagnosis not present

## 2023-11-21 DIAGNOSIS — Z955 Presence of coronary angioplasty implant and graft: Secondary | ICD-10-CM | POA: Diagnosis not present

## 2023-11-21 DIAGNOSIS — M1990 Unspecified osteoarthritis, unspecified site: Secondary | ICD-10-CM | POA: Diagnosis not present

## 2023-11-21 DIAGNOSIS — I252 Old myocardial infarction: Secondary | ICD-10-CM | POA: Diagnosis not present

## 2023-11-21 DIAGNOSIS — Z96642 Presence of left artificial hip joint: Secondary | ICD-10-CM | POA: Diagnosis not present

## 2023-11-21 DIAGNOSIS — R0600 Dyspnea, unspecified: Secondary | ICD-10-CM | POA: Diagnosis not present

## 2023-11-21 DIAGNOSIS — Z794 Long term (current) use of insulin: Secondary | ICD-10-CM | POA: Diagnosis not present

## 2023-11-21 DIAGNOSIS — C3490 Malignant neoplasm of unspecified part of unspecified bronchus or lung: Secondary | ICD-10-CM | POA: Diagnosis not present

## 2023-11-21 DIAGNOSIS — R918 Other nonspecific abnormal finding of lung field: Secondary | ICD-10-CM | POA: Diagnosis not present

## 2023-11-21 DIAGNOSIS — R197 Diarrhea, unspecified: Secondary | ICD-10-CM | POA: Diagnosis not present

## 2023-11-21 DIAGNOSIS — Z7984 Long term (current) use of oral hypoglycemic drugs: Secondary | ICD-10-CM | POA: Diagnosis not present

## 2023-11-21 DIAGNOSIS — C61 Malignant neoplasm of prostate: Secondary | ICD-10-CM | POA: Diagnosis not present

## 2023-11-21 DIAGNOSIS — Z923 Personal history of irradiation: Secondary | ICD-10-CM | POA: Diagnosis not present

## 2023-11-21 DIAGNOSIS — Z888 Allergy status to other drugs, medicaments and biological substances status: Secondary | ICD-10-CM | POA: Diagnosis not present

## 2023-11-21 DIAGNOSIS — J188 Other pneumonia, unspecified organism: Secondary | ICD-10-CM | POA: Diagnosis not present

## 2023-11-21 DIAGNOSIS — I493 Ventricular premature depolarization: Secondary | ICD-10-CM | POA: Diagnosis not present

## 2023-11-21 DIAGNOSIS — J168 Pneumonia due to other specified infectious organisms: Secondary | ICD-10-CM | POA: Diagnosis not present

## 2023-11-21 DIAGNOSIS — J189 Pneumonia, unspecified organism: Secondary | ICD-10-CM | POA: Diagnosis not present

## 2023-11-21 DIAGNOSIS — E7800 Pure hypercholesterolemia, unspecified: Secondary | ICD-10-CM | POA: Diagnosis not present

## 2023-11-21 DIAGNOSIS — Y848 Other medical procedures as the cause of abnormal reaction of the patient, or of later complication, without mention of misadventure at the time of the procedure: Secondary | ICD-10-CM | POA: Diagnosis not present

## 2023-11-21 DIAGNOSIS — R0602 Shortness of breath: Secondary | ICD-10-CM | POA: Diagnosis not present

## 2023-11-21 DIAGNOSIS — J984 Other disorders of lung: Secondary | ICD-10-CM | POA: Diagnosis not present

## 2023-11-21 DIAGNOSIS — Z87891 Personal history of nicotine dependence: Secondary | ICD-10-CM | POA: Diagnosis not present

## 2023-11-21 DIAGNOSIS — R042 Hemoptysis: Secondary | ICD-10-CM | POA: Diagnosis not present

## 2023-11-21 DIAGNOSIS — K219 Gastro-esophageal reflux disease without esophagitis: Secondary | ICD-10-CM | POA: Diagnosis not present

## 2023-11-21 DIAGNOSIS — E119 Type 2 diabetes mellitus without complications: Secondary | ICD-10-CM | POA: Diagnosis not present

## 2023-11-21 DIAGNOSIS — I10 Essential (primary) hypertension: Secondary | ICD-10-CM | POA: Diagnosis not present

## 2023-11-21 DIAGNOSIS — M5442 Lumbago with sciatica, left side: Secondary | ICD-10-CM | POA: Diagnosis not present

## 2023-11-21 DIAGNOSIS — I2510 Atherosclerotic heart disease of native coronary artery without angina pectoris: Secondary | ICD-10-CM | POA: Diagnosis not present

## 2023-11-21 DIAGNOSIS — R040 Epistaxis: Secondary | ICD-10-CM | POA: Diagnosis not present

## 2023-11-21 DIAGNOSIS — I440 Atrioventricular block, first degree: Secondary | ICD-10-CM | POA: Diagnosis not present

## 2023-11-29 DIAGNOSIS — C3490 Malignant neoplasm of unspecified part of unspecified bronchus or lung: Secondary | ICD-10-CM | POA: Diagnosis not present

## 2023-11-29 DIAGNOSIS — I6782 Cerebral ischemia: Secondary | ICD-10-CM | POA: Diagnosis not present

## 2023-11-29 DIAGNOSIS — R9089 Other abnormal findings on diagnostic imaging of central nervous system: Secondary | ICD-10-CM | POA: Diagnosis not present

## 2023-11-30 DIAGNOSIS — C3411 Malignant neoplasm of upper lobe, right bronchus or lung: Secondary | ICD-10-CM | POA: Diagnosis not present

## 2023-11-30 DIAGNOSIS — C3432 Malignant neoplasm of lower lobe, left bronchus or lung: Secondary | ICD-10-CM | POA: Diagnosis not present

## 2023-11-30 DIAGNOSIS — J984 Other disorders of lung: Secondary | ICD-10-CM | POA: Diagnosis not present

## 2023-11-30 DIAGNOSIS — J189 Pneumonia, unspecified organism: Secondary | ICD-10-CM | POA: Diagnosis not present

## 2023-12-05 DIAGNOSIS — I10 Essential (primary) hypertension: Secondary | ICD-10-CM | POA: Diagnosis not present

## 2023-12-05 DIAGNOSIS — C61 Malignant neoplasm of prostate: Secondary | ICD-10-CM | POA: Diagnosis not present

## 2023-12-05 DIAGNOSIS — C3490 Malignant neoplasm of unspecified part of unspecified bronchus or lung: Secondary | ICD-10-CM | POA: Diagnosis not present

## 2023-12-05 DIAGNOSIS — E119 Type 2 diabetes mellitus without complications: Secondary | ICD-10-CM | POA: Diagnosis not present

## 2023-12-08 DIAGNOSIS — M549 Dorsalgia, unspecified: Secondary | ICD-10-CM | POA: Diagnosis not present

## 2023-12-08 DIAGNOSIS — M5124 Other intervertebral disc displacement, thoracic region: Secondary | ICD-10-CM | POA: Diagnosis not present

## 2023-12-08 DIAGNOSIS — C3490 Malignant neoplasm of unspecified part of unspecified bronchus or lung: Secondary | ICD-10-CM | POA: Diagnosis not present

## 2023-12-08 DIAGNOSIS — M47814 Spondylosis without myelopathy or radiculopathy, thoracic region: Secondary | ICD-10-CM | POA: Diagnosis not present

## 2023-12-12 DIAGNOSIS — C3431 Malignant neoplasm of lower lobe, right bronchus or lung: Secondary | ICD-10-CM | POA: Diagnosis not present

## 2023-12-12 DIAGNOSIS — Z5111 Encounter for antineoplastic chemotherapy: Secondary | ICD-10-CM | POA: Diagnosis not present

## 2023-12-12 DIAGNOSIS — C3490 Malignant neoplasm of unspecified part of unspecified bronchus or lung: Secondary | ICD-10-CM | POA: Diagnosis not present

## 2023-12-12 DIAGNOSIS — Z5112 Encounter for antineoplastic immunotherapy: Secondary | ICD-10-CM | POA: Diagnosis not present

## 2023-12-13 DIAGNOSIS — C3431 Malignant neoplasm of lower lobe, right bronchus or lung: Secondary | ICD-10-CM | POA: Diagnosis not present

## 2023-12-13 DIAGNOSIS — C3412 Malignant neoplasm of upper lobe, left bronchus or lung: Secondary | ICD-10-CM | POA: Diagnosis not present

## 2023-12-13 DIAGNOSIS — Z79899 Other long term (current) drug therapy: Secondary | ICD-10-CM | POA: Diagnosis not present

## 2023-12-13 DIAGNOSIS — Z452 Encounter for adjustment and management of vascular access device: Secondary | ICD-10-CM | POA: Diagnosis not present

## 2023-12-13 DIAGNOSIS — C3490 Malignant neoplasm of unspecified part of unspecified bronchus or lung: Secondary | ICD-10-CM | POA: Diagnosis not present

## 2023-12-13 DIAGNOSIS — Z5111 Encounter for antineoplastic chemotherapy: Secondary | ICD-10-CM | POA: Diagnosis not present

## 2023-12-14 DIAGNOSIS — Z5111 Encounter for antineoplastic chemotherapy: Secondary | ICD-10-CM | POA: Diagnosis not present

## 2023-12-14 DIAGNOSIS — C3490 Malignant neoplasm of unspecified part of unspecified bronchus or lung: Secondary | ICD-10-CM | POA: Diagnosis not present

## 2023-12-14 DIAGNOSIS — C3431 Malignant neoplasm of lower lobe, right bronchus or lung: Secondary | ICD-10-CM | POA: Diagnosis not present

## 2023-12-14 DIAGNOSIS — Z5112 Encounter for antineoplastic immunotherapy: Secondary | ICD-10-CM | POA: Diagnosis not present

## 2023-12-15 DIAGNOSIS — Z5112 Encounter for antineoplastic immunotherapy: Secondary | ICD-10-CM | POA: Diagnosis not present

## 2023-12-15 DIAGNOSIS — Z5111 Encounter for antineoplastic chemotherapy: Secondary | ICD-10-CM | POA: Diagnosis not present

## 2023-12-15 DIAGNOSIS — C3431 Malignant neoplasm of lower lobe, right bronchus or lung: Secondary | ICD-10-CM | POA: Diagnosis not present

## 2023-12-15 DIAGNOSIS — C3490 Malignant neoplasm of unspecified part of unspecified bronchus or lung: Secondary | ICD-10-CM | POA: Diagnosis not present

## 2023-12-19 DIAGNOSIS — R0609 Other forms of dyspnea: Secondary | ICD-10-CM | POA: Diagnosis not present

## 2023-12-19 DIAGNOSIS — C3490 Malignant neoplasm of unspecified part of unspecified bronchus or lung: Secondary | ICD-10-CM | POA: Diagnosis not present

## 2023-12-19 DIAGNOSIS — J189 Pneumonia, unspecified organism: Secondary | ICD-10-CM | POA: Diagnosis not present

## 2023-12-19 DIAGNOSIS — Z87891 Personal history of nicotine dependence: Secondary | ICD-10-CM | POA: Diagnosis not present

## 2023-12-26 DIAGNOSIS — Z87891 Personal history of nicotine dependence: Secondary | ICD-10-CM | POA: Diagnosis not present

## 2024-01-02 DIAGNOSIS — C7951 Secondary malignant neoplasm of bone: Secondary | ICD-10-CM | POA: Diagnosis not present

## 2024-01-02 DIAGNOSIS — C61 Malignant neoplasm of prostate: Secondary | ICD-10-CM | POA: Diagnosis not present

## 2024-01-03 DIAGNOSIS — Z5112 Encounter for antineoplastic immunotherapy: Secondary | ICD-10-CM | POA: Diagnosis not present

## 2024-01-03 DIAGNOSIS — C3431 Malignant neoplasm of lower lobe, right bronchus or lung: Secondary | ICD-10-CM | POA: Diagnosis not present

## 2024-01-03 DIAGNOSIS — Z5111 Encounter for antineoplastic chemotherapy: Secondary | ICD-10-CM | POA: Diagnosis not present

## 2024-01-03 DIAGNOSIS — C3490 Malignant neoplasm of unspecified part of unspecified bronchus or lung: Secondary | ICD-10-CM | POA: Diagnosis not present

## 2024-01-04 DIAGNOSIS — C3490 Malignant neoplasm of unspecified part of unspecified bronchus or lung: Secondary | ICD-10-CM | POA: Diagnosis not present

## 2024-01-04 DIAGNOSIS — C3431 Malignant neoplasm of lower lobe, right bronchus or lung: Secondary | ICD-10-CM | POA: Diagnosis not present

## 2024-01-04 DIAGNOSIS — Z5112 Encounter for antineoplastic immunotherapy: Secondary | ICD-10-CM | POA: Diagnosis not present

## 2024-01-04 DIAGNOSIS — Z5111 Encounter for antineoplastic chemotherapy: Secondary | ICD-10-CM | POA: Diagnosis not present

## 2024-01-18 DIAGNOSIS — C61 Malignant neoplasm of prostate: Secondary | ICD-10-CM | POA: Diagnosis not present

## 2024-01-24 DIAGNOSIS — Z5112 Encounter for antineoplastic immunotherapy: Secondary | ICD-10-CM | POA: Diagnosis not present

## 2024-01-24 DIAGNOSIS — Z79899 Other long term (current) drug therapy: Secondary | ICD-10-CM | POA: Diagnosis not present

## 2024-01-24 DIAGNOSIS — C3490 Malignant neoplasm of unspecified part of unspecified bronchus or lung: Secondary | ICD-10-CM | POA: Diagnosis not present

## 2024-01-24 DIAGNOSIS — Z5111 Encounter for antineoplastic chemotherapy: Secondary | ICD-10-CM | POA: Diagnosis not present

## 2024-01-24 DIAGNOSIS — C3431 Malignant neoplasm of lower lobe, right bronchus or lung: Secondary | ICD-10-CM | POA: Diagnosis not present

## 2024-01-24 DIAGNOSIS — R059 Cough, unspecified: Secondary | ICD-10-CM | POA: Diagnosis not present

## 2024-01-25 DIAGNOSIS — C3490 Malignant neoplasm of unspecified part of unspecified bronchus or lung: Secondary | ICD-10-CM | POA: Diagnosis not present

## 2024-01-25 DIAGNOSIS — Z79899 Other long term (current) drug therapy: Secondary | ICD-10-CM | POA: Diagnosis not present

## 2024-01-25 DIAGNOSIS — Z5111 Encounter for antineoplastic chemotherapy: Secondary | ICD-10-CM | POA: Diagnosis not present

## 2024-01-25 DIAGNOSIS — Z5112 Encounter for antineoplastic immunotherapy: Secondary | ICD-10-CM | POA: Diagnosis not present

## 2024-01-25 DIAGNOSIS — R059 Cough, unspecified: Secondary | ICD-10-CM | POA: Diagnosis not present

## 2024-01-25 DIAGNOSIS — C7951 Secondary malignant neoplasm of bone: Secondary | ICD-10-CM | POA: Diagnosis not present

## 2024-01-25 DIAGNOSIS — C3431 Malignant neoplasm of lower lobe, right bronchus or lung: Secondary | ICD-10-CM | POA: Diagnosis not present

## 2024-01-25 DIAGNOSIS — C61 Malignant neoplasm of prostate: Secondary | ICD-10-CM | POA: Diagnosis not present

## 2024-01-25 DIAGNOSIS — N5201 Erectile dysfunction due to arterial insufficiency: Secondary | ICD-10-CM | POA: Diagnosis not present

## 2024-01-30 DIAGNOSIS — C7951 Secondary malignant neoplasm of bone: Secondary | ICD-10-CM | POA: Diagnosis not present

## 2024-01-30 DIAGNOSIS — C61 Malignant neoplasm of prostate: Secondary | ICD-10-CM | POA: Diagnosis not present

## 2024-02-05 DIAGNOSIS — C3490 Malignant neoplasm of unspecified part of unspecified bronchus or lung: Secondary | ICD-10-CM | POA: Diagnosis not present

## 2024-02-05 DIAGNOSIS — Z85118 Personal history of other malignant neoplasm of bronchus and lung: Secondary | ICD-10-CM | POA: Diagnosis not present

## 2024-02-05 DIAGNOSIS — R509 Fever, unspecified: Secondary | ICD-10-CM | POA: Diagnosis not present

## 2024-02-05 DIAGNOSIS — R058 Other specified cough: Secondary | ICD-10-CM | POA: Diagnosis not present

## 2024-02-05 DIAGNOSIS — R059 Cough, unspecified: Secondary | ICD-10-CM | POA: Diagnosis not present

## 2024-02-05 DIAGNOSIS — R918 Other nonspecific abnormal finding of lung field: Secondary | ICD-10-CM | POA: Diagnosis not present

## 2024-02-14 DIAGNOSIS — C3490 Malignant neoplasm of unspecified part of unspecified bronchus or lung: Secondary | ICD-10-CM | POA: Diagnosis not present

## 2024-02-14 DIAGNOSIS — Z5112 Encounter for antineoplastic immunotherapy: Secondary | ICD-10-CM | POA: Diagnosis not present

## 2024-02-14 DIAGNOSIS — Z5111 Encounter for antineoplastic chemotherapy: Secondary | ICD-10-CM | POA: Diagnosis not present

## 2024-02-14 DIAGNOSIS — Z79899 Other long term (current) drug therapy: Secondary | ICD-10-CM | POA: Diagnosis not present

## 2024-02-14 DIAGNOSIS — C3431 Malignant neoplasm of lower lobe, right bronchus or lung: Secondary | ICD-10-CM | POA: Diagnosis not present

## 2024-02-14 DIAGNOSIS — R059 Cough, unspecified: Secondary | ICD-10-CM | POA: Diagnosis not present

## 2024-02-15 DIAGNOSIS — Z5111 Encounter for antineoplastic chemotherapy: Secondary | ICD-10-CM | POA: Diagnosis not present

## 2024-02-15 DIAGNOSIS — Z5112 Encounter for antineoplastic immunotherapy: Secondary | ICD-10-CM | POA: Diagnosis not present

## 2024-02-15 DIAGNOSIS — C3490 Malignant neoplasm of unspecified part of unspecified bronchus or lung: Secondary | ICD-10-CM | POA: Diagnosis not present

## 2024-02-15 DIAGNOSIS — R059 Cough, unspecified: Secondary | ICD-10-CM | POA: Diagnosis not present

## 2024-02-15 DIAGNOSIS — C3431 Malignant neoplasm of lower lobe, right bronchus or lung: Secondary | ICD-10-CM | POA: Diagnosis not present

## 2024-02-15 DIAGNOSIS — Z79899 Other long term (current) drug therapy: Secondary | ICD-10-CM | POA: Diagnosis not present

## 2024-02-16 DIAGNOSIS — R059 Cough, unspecified: Secondary | ICD-10-CM | POA: Diagnosis not present

## 2024-02-16 DIAGNOSIS — C3411 Malignant neoplasm of upper lobe, right bronchus or lung: Secondary | ICD-10-CM | POA: Diagnosis not present

## 2024-02-27 DIAGNOSIS — C61 Malignant neoplasm of prostate: Secondary | ICD-10-CM | POA: Diagnosis not present

## 2024-02-27 DIAGNOSIS — C7951 Secondary malignant neoplasm of bone: Secondary | ICD-10-CM | POA: Diagnosis not present

## 2024-03-02 ENCOUNTER — Other Ambulatory Visit: Payer: Self-pay | Admitting: Cardiology

## 2024-03-02 DIAGNOSIS — I10 Essential (primary) hypertension: Secondary | ICD-10-CM

## 2024-03-06 DIAGNOSIS — C3490 Malignant neoplasm of unspecified part of unspecified bronchus or lung: Secondary | ICD-10-CM | POA: Diagnosis not present

## 2024-03-06 DIAGNOSIS — Z5112 Encounter for antineoplastic immunotherapy: Secondary | ICD-10-CM | POA: Diagnosis not present

## 2024-03-06 DIAGNOSIS — Z5111 Encounter for antineoplastic chemotherapy: Secondary | ICD-10-CM | POA: Diagnosis not present

## 2024-03-06 DIAGNOSIS — Z79899 Other long term (current) drug therapy: Secondary | ICD-10-CM | POA: Diagnosis not present

## 2024-03-06 DIAGNOSIS — C3431 Malignant neoplasm of lower lobe, right bronchus or lung: Secondary | ICD-10-CM | POA: Diagnosis not present

## 2024-03-26 DIAGNOSIS — C61 Malignant neoplasm of prostate: Secondary | ICD-10-CM | POA: Diagnosis not present

## 2024-03-26 DIAGNOSIS — C7951 Secondary malignant neoplasm of bone: Secondary | ICD-10-CM | POA: Diagnosis not present

## 2024-03-27 DIAGNOSIS — Z5111 Encounter for antineoplastic chemotherapy: Secondary | ICD-10-CM | POA: Diagnosis not present

## 2024-03-27 DIAGNOSIS — Z79899 Other long term (current) drug therapy: Secondary | ICD-10-CM | POA: Diagnosis not present

## 2024-03-27 DIAGNOSIS — Z5112 Encounter for antineoplastic immunotherapy: Secondary | ICD-10-CM | POA: Diagnosis not present

## 2024-03-27 DIAGNOSIS — C3490 Malignant neoplasm of unspecified part of unspecified bronchus or lung: Secondary | ICD-10-CM | POA: Diagnosis not present

## 2024-03-27 DIAGNOSIS — C3431 Malignant neoplasm of lower lobe, right bronchus or lung: Secondary | ICD-10-CM | POA: Diagnosis not present

## 2024-04-08 DIAGNOSIS — R059 Cough, unspecified: Secondary | ICD-10-CM | POA: Diagnosis not present

## 2024-04-08 DIAGNOSIS — J189 Pneumonia, unspecified organism: Secondary | ICD-10-CM | POA: Diagnosis not present

## 2024-04-08 DIAGNOSIS — Z20822 Contact with and (suspected) exposure to covid-19: Secondary | ICD-10-CM | POA: Diagnosis not present

## 2024-04-08 DIAGNOSIS — R0602 Shortness of breath: Secondary | ICD-10-CM | POA: Diagnosis not present

## 2024-04-08 DIAGNOSIS — C7951 Secondary malignant neoplasm of bone: Secondary | ICD-10-CM | POA: Diagnosis not present

## 2024-04-08 DIAGNOSIS — R918 Other nonspecific abnormal finding of lung field: Secondary | ICD-10-CM | POA: Diagnosis not present

## 2024-04-08 DIAGNOSIS — J90 Pleural effusion, not elsewhere classified: Secondary | ICD-10-CM | POA: Diagnosis not present

## 2024-04-08 DIAGNOSIS — R509 Fever, unspecified: Secondary | ICD-10-CM | POA: Diagnosis not present

## 2024-04-08 DIAGNOSIS — R0981 Nasal congestion: Secondary | ICD-10-CM | POA: Diagnosis not present

## 2024-04-08 DIAGNOSIS — J168 Pneumonia due to other specified infectious organisms: Secondary | ICD-10-CM | POA: Diagnosis not present

## 2024-04-08 DIAGNOSIS — C3491 Malignant neoplasm of unspecified part of right bronchus or lung: Secondary | ICD-10-CM | POA: Diagnosis not present

## 2024-04-08 DIAGNOSIS — R051 Acute cough: Secondary | ICD-10-CM | POA: Diagnosis not present

## 2024-04-08 DIAGNOSIS — Z87891 Personal history of nicotine dependence: Secondary | ICD-10-CM | POA: Diagnosis not present

## 2024-04-10 DIAGNOSIS — L821 Other seborrheic keratosis: Secondary | ICD-10-CM | POA: Diagnosis not present

## 2024-04-10 DIAGNOSIS — D2239 Melanocytic nevi of other parts of face: Secondary | ICD-10-CM | POA: Diagnosis not present

## 2024-04-10 DIAGNOSIS — D485 Neoplasm of uncertain behavior of skin: Secondary | ICD-10-CM | POA: Diagnosis not present

## 2024-04-17 DIAGNOSIS — C3490 Malignant neoplasm of unspecified part of unspecified bronchus or lung: Secondary | ICD-10-CM | POA: Diagnosis not present

## 2024-04-17 DIAGNOSIS — Z79899 Other long term (current) drug therapy: Secondary | ICD-10-CM | POA: Diagnosis not present

## 2024-04-23 DIAGNOSIS — C7951 Secondary malignant neoplasm of bone: Secondary | ICD-10-CM | POA: Diagnosis not present

## 2024-05-06 DIAGNOSIS — Z23 Encounter for immunization: Secondary | ICD-10-CM | POA: Diagnosis not present

## 2024-05-06 DIAGNOSIS — I10 Essential (primary) hypertension: Secondary | ICD-10-CM | POA: Diagnosis not present

## 2024-05-06 DIAGNOSIS — E119 Type 2 diabetes mellitus without complications: Secondary | ICD-10-CM | POA: Diagnosis not present

## 2024-05-08 DIAGNOSIS — C3490 Malignant neoplasm of unspecified part of unspecified bronchus or lung: Secondary | ICD-10-CM | POA: Diagnosis not present

## 2024-05-08 DIAGNOSIS — Z5111 Encounter for antineoplastic chemotherapy: Secondary | ICD-10-CM | POA: Diagnosis not present

## 2024-05-08 DIAGNOSIS — Z5112 Encounter for antineoplastic immunotherapy: Secondary | ICD-10-CM | POA: Diagnosis not present

## 2024-05-08 DIAGNOSIS — C3431 Malignant neoplasm of lower lobe, right bronchus or lung: Secondary | ICD-10-CM | POA: Diagnosis not present

## 2024-05-21 DIAGNOSIS — C7951 Secondary malignant neoplasm of bone: Secondary | ICD-10-CM | POA: Diagnosis not present

## 2024-05-29 DIAGNOSIS — C3431 Malignant neoplasm of lower lobe, right bronchus or lung: Secondary | ICD-10-CM | POA: Diagnosis not present

## 2024-05-29 DIAGNOSIS — Z5112 Encounter for antineoplastic immunotherapy: Secondary | ICD-10-CM | POA: Diagnosis not present

## 2024-05-29 DIAGNOSIS — C3490 Malignant neoplasm of unspecified part of unspecified bronchus or lung: Secondary | ICD-10-CM | POA: Diagnosis not present

## 2024-05-29 DIAGNOSIS — C3412 Malignant neoplasm of upper lobe, left bronchus or lung: Secondary | ICD-10-CM | POA: Diagnosis not present

## 2024-05-29 DIAGNOSIS — Z5111 Encounter for antineoplastic chemotherapy: Secondary | ICD-10-CM | POA: Diagnosis not present

## 2024-05-29 DIAGNOSIS — Z79899 Other long term (current) drug therapy: Secondary | ICD-10-CM | POA: Diagnosis not present

## 2024-06-05 DIAGNOSIS — C3490 Malignant neoplasm of unspecified part of unspecified bronchus or lung: Secondary | ICD-10-CM | POA: Diagnosis not present

## 2024-06-05 DIAGNOSIS — C3411 Malignant neoplasm of upper lobe, right bronchus or lung: Secondary | ICD-10-CM | POA: Diagnosis not present

## 2024-06-05 DIAGNOSIS — R918 Other nonspecific abnormal finding of lung field: Secondary | ICD-10-CM | POA: Diagnosis not present

## 2024-06-05 DIAGNOSIS — R739 Hyperglycemia, unspecified: Secondary | ICD-10-CM | POA: Diagnosis not present

## 2024-06-05 DIAGNOSIS — C3412 Malignant neoplasm of upper lobe, left bronchus or lung: Secondary | ICD-10-CM | POA: Diagnosis not present

## 2024-06-14 DIAGNOSIS — R059 Cough, unspecified: Secondary | ICD-10-CM | POA: Diagnosis not present

## 2024-06-14 DIAGNOSIS — R490 Dysphonia: Secondary | ICD-10-CM | POA: Diagnosis not present

## 2024-06-18 DIAGNOSIS — C7951 Secondary malignant neoplasm of bone: Secondary | ICD-10-CM | POA: Diagnosis not present

## 2024-06-19 NOTE — Progress Notes (Signed)
 [name]                                          MRN: [id number]   06/19/2024   The [facility] Quality Team Specialist reviewed this patient medical record for the purposes of chart review for care gap closure. The following were reviewed: chart review for care gap closure-kidney health evaluation for diabetes:eGFR  and uACR.    [facility] Quality Team

## 2024-06-21 DIAGNOSIS — C3412 Malignant neoplasm of upper lobe, left bronchus or lung: Secondary | ICD-10-CM | POA: Diagnosis not present

## 2024-07-02 DIAGNOSIS — C61 Malignant neoplasm of prostate: Secondary | ICD-10-CM | POA: Diagnosis not present

## 2024-07-02 DIAGNOSIS — R0609 Other forms of dyspnea: Secondary | ICD-10-CM | POA: Diagnosis not present

## 2024-07-02 DIAGNOSIS — Z87891 Personal history of nicotine dependence: Secondary | ICD-10-CM | POA: Diagnosis not present

## 2024-07-02 DIAGNOSIS — C3412 Malignant neoplasm of upper lobe, left bronchus or lung: Secondary | ICD-10-CM | POA: Diagnosis not present

## 2024-07-02 DIAGNOSIS — J189 Pneumonia, unspecified organism: Secondary | ICD-10-CM | POA: Diagnosis not present
